# Patient Record
Sex: Male | Born: 1935
Health system: Southern US, Community
[De-identification: ages and names within clinical notes are randomized; demographics above are authoritative.]

## PROBLEM LIST (undated history)

## (undated) DIAGNOSIS — I10 Essential (primary) hypertension: Secondary | ICD-10-CM

## (undated) DIAGNOSIS — G629 Polyneuropathy, unspecified: Secondary | ICD-10-CM

## (undated) DIAGNOSIS — N189 Chronic kidney disease, unspecified: Secondary | ICD-10-CM

## (undated) DIAGNOSIS — G4733 Obstructive sleep apnea (adult) (pediatric): Secondary | ICD-10-CM

## (undated) DIAGNOSIS — I2510 Atherosclerotic heart disease of native coronary artery without angina pectoris: Secondary | ICD-10-CM

## (undated) DIAGNOSIS — R413 Other amnesia: Secondary | ICD-10-CM

## (undated) DIAGNOSIS — Z8673 Personal history of transient ischemic attack (TIA), and cerebral infarction without residual deficits: Secondary | ICD-10-CM

## (undated) DIAGNOSIS — R269 Unspecified abnormalities of gait and mobility: Secondary | ICD-10-CM

## (undated) DIAGNOSIS — N529 Male erectile dysfunction, unspecified: Secondary | ICD-10-CM

## (undated) DIAGNOSIS — E559 Vitamin D deficiency, unspecified: Secondary | ICD-10-CM

## (undated) DIAGNOSIS — H3530 Unspecified macular degeneration: Secondary | ICD-10-CM

## (undated) DIAGNOSIS — E785 Hyperlipidemia, unspecified: Secondary | ICD-10-CM

## (undated) DIAGNOSIS — G4700 Insomnia, unspecified: Secondary | ICD-10-CM

## (undated) DIAGNOSIS — I214 Non-ST elevation (NSTEMI) myocardial infarction: Secondary | ICD-10-CM

## (undated) DIAGNOSIS — Z9289 Personal history of other medical treatment: Secondary | ICD-10-CM

## (undated) DIAGNOSIS — H9190 Unspecified hearing loss, unspecified ear: Secondary | ICD-10-CM

## (undated) HISTORY — PX: ANKLE SURGERY: SHX546

## (undated) HISTORY — PX: KNEE SURGERY: SHX244

## (undated) HISTORY — DX: Unspecified hearing loss, unspecified ear: H91.90

## (undated) HISTORY — PX: CARDIAC CATHETERIZATION: SHX172

## (undated) HISTORY — DX: Essential (primary) hypertension: I10

## (undated) HISTORY — DX: Other amnesia: R41.3

## (undated) HISTORY — DX: Male erectile dysfunction, unspecified: N52.9

## (undated) HISTORY — DX: Vitamin D deficiency, unspecified: E55.9

## (undated) HISTORY — DX: Chronic kidney disease, unspecified: N18.9

## (undated) HISTORY — DX: Unspecified abnormalities of gait and mobility: R26.9

## (undated) HISTORY — DX: Insomnia, unspecified: G47.00

## (undated) HISTORY — DX: Personal history of transient ischemic attack (TIA), and cerebral infarction without residual deficits: Z86.73

## (undated) HISTORY — PX: CORONARY ANGIOPLASTY: SHX604

## (undated) HISTORY — PX: CATARACT EXTRACTION: SUR2

---

## 1974-06-04 HISTORY — PX: BACK SURGERY: SHX140

## 1979-06-05 HISTORY — PX: HERNIA REPAIR: SHX51

## 2002-07-04 ENCOUNTER — Encounter: Payer: Self-pay | Admitting: Emergency Medicine

## 2002-07-04 ENCOUNTER — Encounter: Payer: Self-pay | Admitting: Orthopedic Surgery

## 2002-07-04 ENCOUNTER — Inpatient Hospital Stay (HOSPITAL_COMMUNITY): Admission: EM | Admit: 2002-07-04 | Discharge: 2002-07-05 | Payer: Self-pay | Admitting: Emergency Medicine

## 2003-06-05 DIAGNOSIS — I2510 Atherosclerotic heart disease of native coronary artery without angina pectoris: Secondary | ICD-10-CM

## 2003-06-05 HISTORY — DX: Atherosclerotic heart disease of native coronary artery without angina pectoris: I25.10

## 2004-03-24 ENCOUNTER — Inpatient Hospital Stay (HOSPITAL_COMMUNITY): Admission: AD | Admit: 2004-03-24 | Discharge: 2004-03-28 | Payer: Self-pay | Admitting: Cardiology

## 2008-03-26 ENCOUNTER — Ambulatory Visit: Payer: Self-pay | Admitting: Vascular Surgery

## 2008-03-26 ENCOUNTER — Encounter (INDEPENDENT_AMBULATORY_CARE_PROVIDER_SITE_OTHER): Payer: Self-pay | Admitting: Orthopedic Surgery

## 2008-03-26 ENCOUNTER — Ambulatory Visit: Admission: RE | Admit: 2008-03-26 | Discharge: 2008-03-26 | Payer: Self-pay | Admitting: Orthopedic Surgery

## 2009-10-26 ENCOUNTER — Ambulatory Visit (HOSPITAL_BASED_OUTPATIENT_CLINIC_OR_DEPARTMENT_OTHER): Admission: RE | Admit: 2009-10-26 | Discharge: 2009-10-26 | Payer: Self-pay | Admitting: Orthopedic Surgery

## 2010-05-12 HISTORY — PX: TRANSTHORACIC ECHOCARDIOGRAM: SHX275

## 2010-10-20 NOTE — H&P (Signed)
NAME:  Corey, Larson NO.:  [id number]   MEDICAL RECORD NO.:  [id number]          PATIENT TYPE:  INP   LOCATION:  2002                         FACILITY:  [facility]   PHYSICIAN:  Danae Chen, M.D.DATE OF BIRTH:  1928/05/25   DATE OF ADMISSION:  03/24/2004  DATE OF DISCHARGE:                                HISTORY & PHYSICAL   PRIMARY CARE PHYSICIAN:  Aggie Cosier, M.D.   NURSE PRACTITIONER:  Arletha Pili. Kendall, N.P.   CHIEF COMPLAINT:  Chest discomfort.   HISTORY OF PRESENT ILLNESS:  The patient is a 77 year old very pleasant  retired [employer], a patient of Dr. Guadlupe Spanish, who was seen by the  nurse practitioner today after he had been experiencing chest discomfort  symptoms over the past ten days. He first thought this was related to his  reflux disease for which he takes Tums. He only partial relief of these  symptoms. He noticed that these symptoms had been increasing in frequency as  well as severity and also had an episode where it was brought on by exertion  while working in the yard. Yesterday, this discomfort woke him from sleep.  He describes it as a sometimes burning, but some times also a pressure-like  discomfort that did radiate somewhat to both arms and to his neck. It was  relieved with rest. He has had a prior nuclear medicine study in 1990 and in  1996, both of which he said were normal. He has not had a recent cardiac  evaluation other than that. He denies any syncopal episodes. No current  chest pains.  Some mild nausea, no vomiting. No diaphoresis. No headache.   PAST MEDICAL HISTORY:  1.  Peripheral neuropathy.  2.  Seasonal allergies.  3.  Reflux disease.   PAST SURGICAL HISTORY:  1.  Recent right ankle surgery with pin placement for fracture.  2.  Remote history of hernia repair.  3.  Laminectomy.  4.  Vasectomy.   ALLERGIES:  No known drug allergies.   CURRENT MEDICATIONS:  1.  Toprol XL 50 mg one p.o. daily.  2.   Nitroglycerin 0.4 mg one p.o. every three to five minutes p.r.n. chest      pain.  3.  Imdur 30 mg one p.o. daily.  4.  Aspirin 81 mg p.o. daily.  5.  Neurontin 900 mg p.o. t.i.d.  6.  Xanax 0.5 mg p.o. daily p.r.n.  7.  Viagra 100 mg p.o. p.r.n.   FAMILY HISTORY:  Father died of a motor vehicle accident at age 63. Mother  died at 72. No significant of coronary artery disease that he is aware of.   SOCIAL HISTORY:  He is a retired Statistician. He is widowed and  retired. He does not smoke. He has an occasional glass of wine and some  caffeine. No drug abuse. He lives to play golf.   REVIEW OF SYMPTOMS:  Positive for some mild nausea, some mild diaphoresis  and some chest discomfort. Has some sleep disturbance, but no headache, no  shortness of breath, no cough, no dysphagia, no dysuria, no  rash.   PHYSICAL EXAMINATION:  VITAL SIGNS: He is afebrile, blood pressure 108/62,  weight 180 pounds, respirations 16, pulse 92.  GENERAL: He is alert, oriented, and cooperative.  NECK: Supple. There is no jugular venous distention.  HEENT Oropharynx is clear. Pupils equal and reactive.  LUNGS: Clear to auscultation bilaterally.  HEART: Rate regular with murmurs, rubs, or gallops.  ABDOMEN: Soft, nontender with no rebound or guarding.  EXTREMITIES: He has 2+ femoral pulses bilaterally, 2+ dorsalis pedis pulses  bilaterally. He has no peripheral edema. No clubbing, cyanosis, or edema. He  moves all four extremities.  NEUROLOGIC: Cranial nerves II-XII are also grossly intact.   LABORATORY DATA:  Pending at this time; CBC, CMET, serial troponins.  A 12-  lead EKG done at the outside physician's office showed a normal sinus rhythm  with a slight left axis deviation, possible left anterior fascicular block,  no significant ST elevation or segment depressions noted.   IMPRESSION:  A 77 year old with risk factors for coronary artery disease  that include age, but does not include diabetes,  hypertension, high  cholesterol, family history, tobacco, physical activity, or obesity.  However, given his symptoms that are consistent with unstable angina which  include an increasing frequency and severity of his symptoms it is advisable  that we will proceed with a diagnostic catheterization scheduled for Monday  per Dr. Aleen Campi. At this time he will admitted to telemetry, started on a  beta blocker, nitrates, oxygen, and statin. Will also use low-dose ACE  inhibitor and will monitor serial cardiac enzymes as well.       RLK/MEDQ  D:  03/24/2004  T:  03/24/2004  Job:  [id number]   cc:   Arletha Pili. Wetzel Bjornstad   Antionette Char, MD  [address]  [place]  [place] [address]  Fax: [phone]

## 2010-10-20 NOTE — H&P (Signed)
   NAME:  Corey Larson, CUMBIE NO.:  [id number]   MEDICAL RECORD NO.:  [id number]                   PATIENT TYPE:  EMS   LOCATION:  ED                                   FACILITY:  [facility]   PHYSICIAN:  Sherri Rad, M.D.               DATE OF BIRTH:  1928-03-19   DATE OF ADMISSION:  07/04/2002  DATE OF DISCHARGE:                                HISTORY & PHYSICAL   HISTORY OF PRESENT ILLNESS:  This is a 77 year old male who fell outside his  back door on ice and had an obvious right ankle deformity.  Says he does not  have any pain.  He constantly has burning and tingling in both feet and has  a medical history of neuropathy, past surgical history of double hernia in  1981, a laminectomy in 1970.   MEDICATIONS:  1. Neurontin 360 mg q.i.d.  2. Elavil 10 mg p.r.n. basis.  3. Celebrex 200 mg daily.  4. Allegra p.r.n.   SOCIAL HISTORY:  No tobacco use.  No smoking.  Occasional glass of wine.   ALLERGIES:  No known drug allergies.   PHYSICAL EXAMINATION:  VITAL SIGNS:  Temperature 97, blood pressure 172/89,  heart rate 96, respirations 18.  CARDIOVASCULAR:  Regular rate and rhythm.  No murmurs.  LUNGS:  Clear to auscultation bilaterally.  Negative wheezes.  Negative  rales.  HEENT:  Pupils are equal, round, reactive to light.  NECK:  Supple.  No adenopathy.  ABDOMEN:  Positive bowel sounds, soft, nontender to palpation.  EXTREMITIES:  Right ankle has an obvious bony deformity.  Has palpable DP  and PT pulses equal bilaterally.  He has active range of motion and wiggles  his toes and very minimal range of the right ankle.  Capillary refill is  brisk.  Bilateral calves are soft and nontender to palpation.   REVIEW OF SYSTEMS:  No seizures.  No diarrhea.  No dizziness.  No melena.  No bowel and bladder dysfunction.  No history of diabetes.   IMPRESSION:  Right ankle dislocation with fracture of fibula.   PLAN:  Take him to the OR today on July 04, 2002 for open reduction  internal fixation with Syndesmotic screw and we will admit him per Leonides Grills, M.D.     Lianne Cure, P.A.                     Sherri Rad, M.D.    MC/MEDQ  D:  07/04/2002  T:  07/04/2002  Job:  [id number]

## 2010-10-20 NOTE — Consult Note (Signed)
   NAME:  Corey Larson, BALABAN NO.:  [id number]   MEDICAL RECORD NO.:  [id number]                   PATIENT TYPE:  EMS   LOCATION:  ED                                   [facility]ACILITY:  [facility]   PHYSICIAN:  Leonides Grills, M.D.                  DATE OF BIRTH:  02-08-1928   DATE OF CONSULTATION:  07/04/2002  DATE OF DISCHARGE:                                   CONSULTATION   CHIEF COMPLAINT:  Right ankle deformity.   HISTORY:  This is a 77 year old pleasant gentleman with significant medical  history of idiopathic peripheral neuropathy who slipped and fell on the ice  today and had a right ankle deformity.  He was then brought to the [facility] ER where x-rays were obtained, and I was consulted for further  evaluation and treatment.   PAST MEDICAL HISTORY:  Significant for idiopathic peripheral neuropathy.   MEDICATIONS:  He takes Neurontin.   ALLERGIES:  HE HAS NO KNOWN DRUG ALLERGIES.   SOCIAL HISTORY:  He does not smoke.   PHYSICAL EXAMINATION:  GENERAL:  Well-nourished, well-developed, no apparent  distress.  Very pleasant gentleman.  VITAL SIGNS:  Respirations 18.  EXTREMITIES:  He has palpable dorsalis pedis and posterior tibial pulses  compartments __________ leg and foot.  He has a slight valgus deformity to  the right ankle.  He has decreased sensation in stocking and glove manner  bilateral feet.  He has no skin swelling at this point or significant  ecchymosis.   X-RAYS:  The three views of the right ankle show an SER-IV equivalent  lateral malleolus fracture.   IMPRESSION:  SER-IV equivalent lateral malleolus fracture.   PLAN:  I explained to the Mr. and Mrs. Eddinger that due to the fact that he  has peripheral neuropathy, he will need for more heavy-duty fixation and  will be nonweightbearing for a longer period of time.  I explained to them  the risks of the procedure which include infection, nerve or vessel injury,  nonunion, malunion,  artery revision or artery failure, shock or arthropathy  with possible below knee amputation were all explained.  Questions were  encouraged and answered.  We will proceed with this immediately while his  skin is still without any compromise and without any swelling.  We do this  under spinal.  We will try to do this without a tourniquet to avoid a double  crush phenomenon to the nerves.                                               Leonides Grills, M.D.    PB/MEDQ  D:  07/04/2002  T:  07/04/2002  Job:  [id number]

## 2010-10-20 NOTE — Discharge Summary (Signed)
NAME:  Corey Larson, SCHARA NO.:  [id number]   MEDICAL RECORD NO.:  [id number]          PATIENT TYPE:  INP   LOCATION:  6522                         FACILITY:  [facility]   PHYSICIAN:  Jaclyn Prime. Lucas Mallow, M.D.   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  He described the pain as  substernal, pressing, and radiating to both arms, relieved by rest, and  lasting for 5-10 minutes.  He had arranged to be seen in our office on his  return from [facility], and the night before that office visit, had  prolonged chest pain at rest associated with diaphoresis.  Accordingly, he  was promptly admitted for stabilization and early cardiac catheterization.  His EKGs taken both at [facility] and in our office were unremarkable  except for the absence of an R wave in V1, of questionable significance.   Past medical history, family and social history, review of systems are to be  found in the dictated history and physical.   PHYSICAL EXAM:  GENERAL:  On physical exam, he was awake and alert.  CHEST:  His chest was clear to auscultation and percussion.  CARDIAC:  He has normal peripheral pulses.  His heart sounds were normal.   COURSE IN THE HOSPITAL:  He was started on Lovenox by injection, beta  blockers, and statin drugs.  The second night in the hospital, while  awaiting cardiac catheterization early Monday morning, he again developed  chest pain, was started on nitroglycerin drip, and had no further chest  pain.  Cardiac catheterization was done on March 27, 2004 by Dr. Antionette Char.  It revealed single-vessel coronary artery disease with 95% focal  lesion in the mid left  anterior descending coronary.  There were minor  irregularities of the proximal right coronary and normal left ventricular  function.  Dr. Aleen Campi carried out successful angioplasty with PTCA and  stenting with a drug-eluting stent in the left anterior descending lesion.  This was carried out successfully and Perclose device was used without  difficulty.   Not mentioned above, laboratory data include white cells 9900, hemoglobin  14.5, hematocrit 43, platelets 173,000, normal automated differential, INR  1.1, creatinine 1.2, BUN 15, electrolytes normal, non-fasting glucose 108,  fasting glucose 99, the remainder of the so-called comprehensive metabolic  panel all normal, CK 147, CK-MB 4.6, relative index of 3.1%, troponin I  0.06.  Additional enzyme results were all negative.  Cholesterol was 127  with an LDL of 68 and an HDL of 35.   Chest x-ray was normal.   EKG mentioned above showed sinus rhythm, nonspecific T wave flattening in  the lateral leads, and no diagnostic Q waves.   His course after cardiac catheterization was benign, he walked well and had  no difficulty.  He was ready for discharge the following day.   FINAL DIAGNOSIS:  Unstable angina with single-vessel left anterior  descending coronary disease.   OPERATION:  Cardiac catheterization and drug-eluting stent implantation.   COMPLICATIONS:  None.   RECOMMENDATION ON DISCHARGE:  1.  A cholesterol-lowering diet (although his cholesterol was already very      low).  2.  Medications:      1.  Plavix 15 mg daily.      2.  Toprol-XL 50 mg daily.      3.  Crestor 10 mg 1/2 tablet daily.      4.  Aspirin 81 mg daily (the last pending review).  3.  Pain management not applicable.  4.  Activity:  No heavy lifting or straining for 4 days.  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  5.  Diet:  Cholesterol-lowering.  6.  Wound care:  Not  applicable.  He may shower.   FOLLOWUP:  Followup appointment with physician and further lab work after  his trip.   CONDITION ON DISCHARGE:  Stable and improved.   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       DDG/MEDQ  D:  03/28/2004  T:  03/28/2004  Job:  [id number]   cc:   Antionette Char, MD  [address]  [place]  [place] [address]  Fax: [phone]   Jonita Albee, M.D.  [facility]  [address]  [place]  [place] [address]  Fax: [phone]

## 2010-10-20 NOTE — Cardiovascular Report (Signed)
NAME:  Corey Larson, Corey Larson NO.:  [id number]   MEDICAL RECORD NO.:  [id number]          PATIENT TYPE:  INP   LOCATION:  2002                         FACILITY:  [facility]   PHYSICIAN:  Antionette Char, MD    DATE OF BIRTH:  1927-08-12   DATE OF PROCEDURE:  03/27/2004  DATE OF DISCHARGE:                              CARDIAC CATHETERIZATION   PROCEDURES:  1.  Left heart catheterization.  2.  Coronary cine angiography.  3.  Left internal mammary artery cine angiography.  4.  Left ventricular cine angiography.  5.  Angioplasty with percutaneous transluminal coronary angioplasty and      stenting with drug-eluting stent in the mid left anterior descending      coronary artery.  6.  AngioSeal of the right femoral artery.   REFERRING PHYSICIAN:  Jaclyn Prime. Lucas Mallow, M.D.   CARDIOLOGIST PERFORMING PROCEDURE:  Antionette Char, M.D.   INDICATIONS FOR PROCEDURE:  This 77 year old male had the recent onset of  exertional angina which increased in severity until he was admitted on  March 24, 2004 with the diagnosis of unstable angina.  He was scheduled  for a catheterization today.   DESCRIPTION OF PROCEDURE:  After signing an informed consent, the patient  was premedication with 5 mg of Valium by mouth and brought to the cardiac  catheterization lab.  His right groin was prepped and draped in a sterile  fashion and anesthetized locally with 0.1% lidocaine.  A #6 French  introducer sheath was inserted percutaneously into the right femoral artery.  A 6 French #4 Judkins coronary catheters were used to make injections into  the native coronary arteries.  The right coronary catheter was used to make  a midstream injection into the left subclavian artery to visualize the left  internal mammary artery.  A 6 French pigtail catheter was used to measure  pressures in the left ventricle and aorta and to make mid stream injections  into the left ventricle and abdominal aorta.  After noting a  critical  stenosis in the mid left anterior descending we discussed this with the  patient and reviewed his indications for angioplasty and intervention.  We  also reviewed the cines and findings with Dr. Sharyn Lull.  We then selected a 6  [place] guide catheter which was advanced to the root of the aorta after  engaging the ostium of the left coronary artery to visualize the lesion in  the mid LAD.  A short HTF guidewire was inserted through the guide catheter  and into the LAD and across the lesion positioning the tip in the distal  segment.  We then selected a 3.0 x 15 mm Maverick balloon catheter which was  advanced over the guidewire and positioned within the lesion.  One inflation  was made at 6 atmospheres for 29 seconds.  After this inflation, the balloon  was removed and injections again into the left coronary artery showed a  partial opening with compromised flow into the side branch/diagonal  branches.  We then selected a 3.5 x 20 mm Taxus drug-eluting stent and after  proper preparation this was inserted over the guidewire and positioned  within the lesion.  The stent was then deployed with one inflation at 14  atmospheres for 23 seconds.  The deployment balloon was removed, and further  injections into the LAD showed essentially total occlusion of the two  diagonal side branches that were within the stent.  We then redirected the  guidewire into the distal larger diagonal branch and with instrumentation of  this side branch it returned to normal appearance and normal flow.  Two  intracoronary nitroglycerin injections were made at 200 units and following  these injections the smaller of the two diagonal branches was then patent  again and showed normal flow.  His electrocardiographic changes returned to  normal, and his chest pain was relieved.  He also received fentanyl 50 mg IV  for pain.  Further injections were made into the left coronary artery  showing an excellent  angiographic result within the stent and normal  appearance of the distal diagonal branch and mild stenosis in the proximal  branch which was present prior to the stent deployment.  The patient  tolerated the procedure well, and no complications were noted.  At the end  of the procedure, the catheter and sheath were removed from the right  femoral artery and hemostasis was easily obtained with an AngioSeal closure  system.   Medications given were heparin 3500 units IV, Integrilin drip per pharmacy  protocol, fentanyl 50 mg IV, nitroglycerin 200 units intracoronary x2.   CINE FINDINGS:  Coronary cine angiography:  The left coronary artery, the ostium and left main appear normal.   Left anterior descending:  The proximal LAD appears normal.  There is an  irregular 95% focal lesion in the middle segment with two diagonal side  branches involved within the lesion.  The first is a smaller branch with a  mild to moderate lesion of 50% at its takeoff.  The second was a larger  branch which appeared normal.  The distal LAD appeared normal and was a  large vessel which wrapped around the apex.   The circumflex coronary artery appeared normal.   Right coronary artery:  The right coronary artery had a large Shepherd's  hook.  The ostium appeared normal. There was a mild plaque in the proximal  segment.  There was normal antegrade flow.  The left internal mammary artery  appeared normal.   Left ventricular cine angiogram:  The left ventricular chamber size and  contractility appear normal.  The mitral and aortic valves appeared normal.  The overall left ventricular contractility was normal with an ejection  fraction of approximately 70%.  There was no abnormally moving segments.   Abdominal aortogram:  The mid abdominal aorta and renal arteries appear  normal.   Angioplasty cines: Cines taking during the angioplasty procedure showed proper positioning of the guidewire and balloon catheter  with a good balloon  form obtained.  Further cine showed proper positioning of the stent  deployment system and again with a good balloon deployment obtained.   Other cines showed total occlusion of the two side branch diagonal vessels  which arose within the lesion and stented area.  Further cine showed  successful instrumentation with a guidewire into the more distal larger  diagonal branch with successful reopening and normal flow and normal  appearance without use of the balloon.  Further cine showed reopening of the  proximal smaller branch with return to the preangioplasty appearance of a 40-  50% ostial lesion and normal antegrade flow.  The stented area appeared  normal with 0% residual lesion and no evidence for dissection or clot.  There was normal TIME-3 antegrade flow.   FINAL DIAGNOSES:  1.  Single vessel coronary artery disease with 95% focal lesion in the mid      left anterior descending.  2.  Minor irregularities in the proximal right coronary artery.  3.  Normal left internal mammary artery.  4.  Normal left ventricular function.  5.  Normal abdominal aorta and renal arteries.  6.  Successful angioplasty with percutaneous transluminal coronary      angioplasty and stenting with drug-eluting stent in the mid left      anterior descending lesion.  7.  Successful AngioSeal of the right femoral artery.   DISPOSITION:  We will monitor on the EAD overnight and continue the  Integrilin drip for 18 hours.  Plavix 300 mg was given p.o. and will  continue 75 mg every day.  Hopefully, he will be stable for discharge in the  a.m.      John   JRT/MEDQ  D:  03/27/2004  T:  03/27/2004  Job:  [id number]   cc:   Jaclyn Prime. Lucas Mallow, M.D.  [address]  [place]  [place] [address]  Fax: [phone]   Catheterization Lab

## 2010-10-20 NOTE — Op Note (Signed)
NAME:  Corey Larson, Corey Larson NO.:  [id number]   MEDICAL RECORD NO.:  [id number]                   PATIENT TYPE:  INP   LOCATION:  0465                                 FACILITY:  [facility]   PHYSICIAN:  Leonides Grills, M.D.                  DATE OF BIRTH:  [birth year]   DATE OF PROCEDURE:  07/04/2002  DATE OF DISCHARGE:                                 OPERATIVE REPORT   PREOPERATIVE DIAGNOSES:  1. Right supination, external rotation-type IV, lateral malleolus fracture.  2. Right distal tibia-fibula syndesmotic rupture.   POSTOPERATIVE DIAGNOSES:  1. Right supination, external rotation-type IV, lateral malleolus fracture.  2. Right distal tibia-fibula syndesmotic rupture.   OPERATION:  1. Open reduction internal fixation, right lateral malleolus fracture.  2. Open reduction internal fixation, right distal tibia-fibula syndesmotic     rupture.   ANESTHESIA:  Spinal with sedation.   SURGEON:  Leonides Grills, M.D.   ASSISTANT:  Lianne Cure, P.A.   ESTIMATED BLOOD LOSS:  Minimal.   TOURNIQUET:  None.   COMPLICATIONS:  None.   DISPOSITION:  Stable to PR.   INDICATIONS FOR PROCEDURE:  This is a 77 year old male who slipped and fell  on the ice just hours earlier and sustained the above injuries.  He also had  idiopathic peripheral neuropathy but no other significant medical problems.  He was consented for the above procedure.  All risks which include  infection, neurovascular injury, malunion, nonunion, hardware rotation,  hardware failure, shock, or arthropathy, stiffness, arthritis, and possible  amputation due to severe arthropathy or infection were all explained and  questions were encouraged and answered.   PROCEDURE:  The patient was brought to the operating room, placed in a  supine position after adequate spinal anesthesia with sedation was  administered as well as Ancef 1 gram IV piggyback.  A bump was placed under  right lateral hip and the  right lower extremity was then prepped and draped  in a sterile manner over a proximal placed thigh tourniquet which was not  used.  We started the procedure with a longitudinal incision over the  lateral malleolus.  Dissection was carried down to bone.  The fracture site  was identified.  Hematoma was removed with a curette.  An AO one-third  tubular plate was then chosen and placed on in a semi-antiglide  configuration for anticipation of multiple syndesmotic screws.  Once this  was done, we used a 2-point Weber clamp and reduced the fracture with  internal rotation at the ankle.  Once this was done and the fracture was  anatomically reduced, we applied an antiglide screw just above the fracture  site with a 3-2 drill using a 4.5-mm fully threaded cortical screw.  This is  a positional-type screw.  The ankle was held in dorsiflexion.  Once this was  done, we placed three other proximal syndesmotic screws through this  one.  Again, these were all set screws and were not lagged over.  We then placed  two distal 3.5-mm fully threaded cortical screws in the distal fragment.  C-  arm views were obtained in AP and lateral.  Mortise views showed an anatomic  reduction with reduced ankle.  The wound is copiously irrigated with normal  saline.  The subcutaneous was closed with 3-0 Vicryl.  The skin was closed  with 4-0 nylon.  Sterile dressing applied.  Modified Jones dressing applied.  The ankle dorsiflexed.  The patient was stable to the PR.                                               Leonides Grills, M.D.    PB/MEDQ  D:  07/04/2002  T:  07/04/2002  Job:  [id number]

## 2011-06-20 DIAGNOSIS — G894 Chronic pain syndrome: Secondary | ICD-10-CM | POA: Diagnosis not present

## 2011-06-20 DIAGNOSIS — M5137 Other intervertebral disc degeneration, lumbosacral region: Secondary | ICD-10-CM | POA: Diagnosis not present

## 2011-06-21 DIAGNOSIS — M1710 Unilateral primary osteoarthritis, unspecified knee: Secondary | ICD-10-CM | POA: Diagnosis not present

## 2011-06-28 DIAGNOSIS — M1710 Unilateral primary osteoarthritis, unspecified knee: Secondary | ICD-10-CM | POA: Diagnosis not present

## 2011-07-03 DIAGNOSIS — M109 Gout, unspecified: Secondary | ICD-10-CM | POA: Diagnosis not present

## 2011-07-03 DIAGNOSIS — I10 Essential (primary) hypertension: Secondary | ICD-10-CM | POA: Diagnosis not present

## 2011-07-03 DIAGNOSIS — M47817 Spondylosis without myelopathy or radiculopathy, lumbosacral region: Secondary | ICD-10-CM | POA: Diagnosis not present

## 2011-07-03 DIAGNOSIS — Z125 Encounter for screening for malignant neoplasm of prostate: Secondary | ICD-10-CM | POA: Diagnosis not present

## 2011-07-03 DIAGNOSIS — E559 Vitamin D deficiency, unspecified: Secondary | ICD-10-CM | POA: Diagnosis not present

## 2011-07-03 DIAGNOSIS — E785 Hyperlipidemia, unspecified: Secondary | ICD-10-CM | POA: Diagnosis not present

## 2011-07-05 DIAGNOSIS — M1710 Unilateral primary osteoarthritis, unspecified knee: Secondary | ICD-10-CM | POA: Diagnosis not present

## 2011-07-06 DIAGNOSIS — E782 Mixed hyperlipidemia: Secondary | ICD-10-CM | POA: Diagnosis not present

## 2011-07-06 DIAGNOSIS — I2510 Atherosclerotic heart disease of native coronary artery without angina pectoris: Secondary | ICD-10-CM | POA: Diagnosis not present

## 2011-07-10 DIAGNOSIS — I10 Essential (primary) hypertension: Secondary | ICD-10-CM | POA: Diagnosis not present

## 2011-07-10 DIAGNOSIS — G4733 Obstructive sleep apnea (adult) (pediatric): Secondary | ICD-10-CM | POA: Diagnosis not present

## 2011-07-10 DIAGNOSIS — N529 Male erectile dysfunction, unspecified: Secondary | ICD-10-CM | POA: Diagnosis not present

## 2011-07-10 DIAGNOSIS — R5382 Chronic fatigue, unspecified: Secondary | ICD-10-CM | POA: Diagnosis not present

## 2011-07-10 DIAGNOSIS — E785 Hyperlipidemia, unspecified: Secondary | ICD-10-CM | POA: Diagnosis not present

## 2011-07-10 DIAGNOSIS — I2510 Atherosclerotic heart disease of native coronary artery without angina pectoris: Secondary | ICD-10-CM | POA: Diagnosis not present

## 2011-07-10 DIAGNOSIS — G609 Hereditary and idiopathic neuropathy, unspecified: Secondary | ICD-10-CM | POA: Diagnosis not present

## 2011-07-10 DIAGNOSIS — R0989 Other specified symptoms and signs involving the circulatory and respiratory systems: Secondary | ICD-10-CM | POA: Diagnosis not present

## 2011-07-10 DIAGNOSIS — R0609 Other forms of dyspnea: Secondary | ICD-10-CM | POA: Diagnosis not present

## 2011-07-12 DIAGNOSIS — M1710 Unilateral primary osteoarthritis, unspecified knee: Secondary | ICD-10-CM | POA: Diagnosis not present

## 2011-07-19 DIAGNOSIS — M1710 Unilateral primary osteoarthritis, unspecified knee: Secondary | ICD-10-CM | POA: Diagnosis not present

## 2011-07-19 DIAGNOSIS — H353190 Nonexudative age-related macular degeneration, unspecified eye, stage unspecified: Secondary | ICD-10-CM | POA: Diagnosis not present

## 2011-07-19 DIAGNOSIS — H02409 Unspecified ptosis of unspecified eyelid: Secondary | ICD-10-CM | POA: Diagnosis not present

## 2011-07-26 DIAGNOSIS — G4733 Obstructive sleep apnea (adult) (pediatric): Secondary | ICD-10-CM | POA: Diagnosis not present

## 2011-10-30 DIAGNOSIS — H353190 Nonexudative age-related macular degeneration, unspecified eye, stage unspecified: Secondary | ICD-10-CM | POA: Diagnosis not present

## 2011-10-30 DIAGNOSIS — H353290 Exudative age-related macular degeneration, unspecified eye, stage unspecified: Secondary | ICD-10-CM | POA: Diagnosis not present

## 2011-10-30 DIAGNOSIS — H43819 Vitreous degeneration, unspecified eye: Secondary | ICD-10-CM | POA: Diagnosis not present

## 2011-11-01 DIAGNOSIS — I2510 Atherosclerotic heart disease of native coronary artery without angina pectoris: Secondary | ICD-10-CM | POA: Diagnosis not present

## 2011-11-01 DIAGNOSIS — E782 Mixed hyperlipidemia: Secondary | ICD-10-CM | POA: Diagnosis not present

## 2011-11-01 DIAGNOSIS — G4733 Obstructive sleep apnea (adult) (pediatric): Secondary | ICD-10-CM | POA: Diagnosis not present

## 2011-11-27 DIAGNOSIS — H353290 Exudative age-related macular degeneration, unspecified eye, stage unspecified: Secondary | ICD-10-CM | POA: Diagnosis not present

## 2011-12-25 DIAGNOSIS — H353290 Exudative age-related macular degeneration, unspecified eye, stage unspecified: Secondary | ICD-10-CM | POA: Diagnosis not present

## 2012-01-04 DIAGNOSIS — M5137 Other intervertebral disc degeneration, lumbosacral region: Secondary | ICD-10-CM | POA: Diagnosis not present

## 2012-01-04 DIAGNOSIS — M999 Biomechanical lesion, unspecified: Secondary | ICD-10-CM | POA: Diagnosis not present

## 2012-01-04 DIAGNOSIS — M25559 Pain in unspecified hip: Secondary | ICD-10-CM | POA: Diagnosis not present

## 2012-01-09 DIAGNOSIS — M25559 Pain in unspecified hip: Secondary | ICD-10-CM | POA: Diagnosis not present

## 2012-01-09 DIAGNOSIS — M5137 Other intervertebral disc degeneration, lumbosacral region: Secondary | ICD-10-CM | POA: Diagnosis not present

## 2012-01-09 DIAGNOSIS — M999 Biomechanical lesion, unspecified: Secondary | ICD-10-CM | POA: Diagnosis not present

## 2012-01-09 DIAGNOSIS — N529 Male erectile dysfunction, unspecified: Secondary | ICD-10-CM | POA: Diagnosis not present

## 2012-01-09 DIAGNOSIS — I10 Essential (primary) hypertension: Secondary | ICD-10-CM | POA: Diagnosis not present

## 2012-01-09 DIAGNOSIS — E559 Vitamin D deficiency, unspecified: Secondary | ICD-10-CM | POA: Diagnosis not present

## 2012-01-09 DIAGNOSIS — E785 Hyperlipidemia, unspecified: Secondary | ICD-10-CM | POA: Diagnosis not present

## 2012-01-09 DIAGNOSIS — M109 Gout, unspecified: Secondary | ICD-10-CM | POA: Diagnosis not present

## 2012-01-10 DIAGNOSIS — M25559 Pain in unspecified hip: Secondary | ICD-10-CM | POA: Diagnosis not present

## 2012-01-10 DIAGNOSIS — M5137 Other intervertebral disc degeneration, lumbosacral region: Secondary | ICD-10-CM | POA: Diagnosis not present

## 2012-01-10 DIAGNOSIS — M999 Biomechanical lesion, unspecified: Secondary | ICD-10-CM | POA: Diagnosis not present

## 2012-01-11 DIAGNOSIS — M999 Biomechanical lesion, unspecified: Secondary | ICD-10-CM | POA: Diagnosis not present

## 2012-01-11 DIAGNOSIS — M25559 Pain in unspecified hip: Secondary | ICD-10-CM | POA: Diagnosis not present

## 2012-01-11 DIAGNOSIS — M5137 Other intervertebral disc degeneration, lumbosacral region: Secondary | ICD-10-CM | POA: Diagnosis not present

## 2012-01-14 DIAGNOSIS — M999 Biomechanical lesion, unspecified: Secondary | ICD-10-CM | POA: Diagnosis not present

## 2012-01-14 DIAGNOSIS — M25559 Pain in unspecified hip: Secondary | ICD-10-CM | POA: Diagnosis not present

## 2012-01-14 DIAGNOSIS — M5137 Other intervertebral disc degeneration, lumbosacral region: Secondary | ICD-10-CM | POA: Diagnosis not present

## 2012-01-15 DIAGNOSIS — M5137 Other intervertebral disc degeneration, lumbosacral region: Secondary | ICD-10-CM | POA: Diagnosis not present

## 2012-01-15 DIAGNOSIS — M25559 Pain in unspecified hip: Secondary | ICD-10-CM | POA: Diagnosis not present

## 2012-01-15 DIAGNOSIS — M109 Gout, unspecified: Secondary | ICD-10-CM | POA: Diagnosis not present

## 2012-01-15 DIAGNOSIS — I10 Essential (primary) hypertension: Secondary | ICD-10-CM | POA: Diagnosis not present

## 2012-01-15 DIAGNOSIS — I2510 Atherosclerotic heart disease of native coronary artery without angina pectoris: Secondary | ICD-10-CM | POA: Diagnosis not present

## 2012-01-15 DIAGNOSIS — E785 Hyperlipidemia, unspecified: Secondary | ICD-10-CM | POA: Diagnosis not present

## 2012-01-15 DIAGNOSIS — M999 Biomechanical lesion, unspecified: Secondary | ICD-10-CM | POA: Diagnosis not present

## 2012-01-17 DIAGNOSIS — M25559 Pain in unspecified hip: Secondary | ICD-10-CM | POA: Diagnosis not present

## 2012-01-17 DIAGNOSIS — M999 Biomechanical lesion, unspecified: Secondary | ICD-10-CM | POA: Diagnosis not present

## 2012-01-17 DIAGNOSIS — M5137 Other intervertebral disc degeneration, lumbosacral region: Secondary | ICD-10-CM | POA: Diagnosis not present

## 2012-01-21 DIAGNOSIS — M25559 Pain in unspecified hip: Secondary | ICD-10-CM | POA: Diagnosis not present

## 2012-01-21 DIAGNOSIS — M999 Biomechanical lesion, unspecified: Secondary | ICD-10-CM | POA: Diagnosis not present

## 2012-01-21 DIAGNOSIS — M5137 Other intervertebral disc degeneration, lumbosacral region: Secondary | ICD-10-CM | POA: Diagnosis not present

## 2012-01-22 DIAGNOSIS — M25559 Pain in unspecified hip: Secondary | ICD-10-CM | POA: Diagnosis not present

## 2012-01-22 DIAGNOSIS — M999 Biomechanical lesion, unspecified: Secondary | ICD-10-CM | POA: Diagnosis not present

## 2012-01-22 DIAGNOSIS — M5137 Other intervertebral disc degeneration, lumbosacral region: Secondary | ICD-10-CM | POA: Diagnosis not present

## 2012-01-24 DIAGNOSIS — M999 Biomechanical lesion, unspecified: Secondary | ICD-10-CM | POA: Diagnosis not present

## 2012-01-24 DIAGNOSIS — H353290 Exudative age-related macular degeneration, unspecified eye, stage unspecified: Secondary | ICD-10-CM | POA: Diagnosis not present

## 2012-01-24 DIAGNOSIS — M25559 Pain in unspecified hip: Secondary | ICD-10-CM | POA: Diagnosis not present

## 2012-01-24 DIAGNOSIS — M5137 Other intervertebral disc degeneration, lumbosacral region: Secondary | ICD-10-CM | POA: Diagnosis not present

## 2012-01-28 DIAGNOSIS — M25559 Pain in unspecified hip: Secondary | ICD-10-CM | POA: Diagnosis not present

## 2012-01-28 DIAGNOSIS — M5137 Other intervertebral disc degeneration, lumbosacral region: Secondary | ICD-10-CM | POA: Diagnosis not present

## 2012-01-28 DIAGNOSIS — M999 Biomechanical lesion, unspecified: Secondary | ICD-10-CM | POA: Diagnosis not present

## 2012-01-29 DIAGNOSIS — M25559 Pain in unspecified hip: Secondary | ICD-10-CM | POA: Diagnosis not present

## 2012-01-29 DIAGNOSIS — M999 Biomechanical lesion, unspecified: Secondary | ICD-10-CM | POA: Diagnosis not present

## 2012-01-29 DIAGNOSIS — M5137 Other intervertebral disc degeneration, lumbosacral region: Secondary | ICD-10-CM | POA: Diagnosis not present

## 2012-01-31 DIAGNOSIS — M5137 Other intervertebral disc degeneration, lumbosacral region: Secondary | ICD-10-CM | POA: Diagnosis not present

## 2012-01-31 DIAGNOSIS — M999 Biomechanical lesion, unspecified: Secondary | ICD-10-CM | POA: Diagnosis not present

## 2012-01-31 DIAGNOSIS — M25559 Pain in unspecified hip: Secondary | ICD-10-CM | POA: Diagnosis not present

## 2012-02-05 DIAGNOSIS — M25559 Pain in unspecified hip: Secondary | ICD-10-CM | POA: Diagnosis not present

## 2012-02-05 DIAGNOSIS — M5137 Other intervertebral disc degeneration, lumbosacral region: Secondary | ICD-10-CM | POA: Diagnosis not present

## 2012-02-05 DIAGNOSIS — M999 Biomechanical lesion, unspecified: Secondary | ICD-10-CM | POA: Diagnosis not present

## 2012-02-07 DIAGNOSIS — M999 Biomechanical lesion, unspecified: Secondary | ICD-10-CM | POA: Diagnosis not present

## 2012-02-07 DIAGNOSIS — M5137 Other intervertebral disc degeneration, lumbosacral region: Secondary | ICD-10-CM | POA: Diagnosis not present

## 2012-02-07 DIAGNOSIS — M25559 Pain in unspecified hip: Secondary | ICD-10-CM | POA: Diagnosis not present

## 2012-02-29 DIAGNOSIS — Z23 Encounter for immunization: Secondary | ICD-10-CM | POA: Diagnosis not present

## 2012-03-06 DIAGNOSIS — H353290 Exudative age-related macular degeneration, unspecified eye, stage unspecified: Secondary | ICD-10-CM | POA: Diagnosis not present

## 2012-04-17 ENCOUNTER — Other Ambulatory Visit (HOSPITAL_COMMUNITY): Payer: Self-pay

## 2012-04-17 DIAGNOSIS — I2510 Atherosclerotic heart disease of native coronary artery without angina pectoris: Secondary | ICD-10-CM

## 2012-04-17 DIAGNOSIS — I10 Essential (primary) hypertension: Secondary | ICD-10-CM | POA: Diagnosis not present

## 2012-04-17 DIAGNOSIS — N529 Male erectile dysfunction, unspecified: Secondary | ICD-10-CM | POA: Diagnosis not present

## 2012-04-24 DIAGNOSIS — H353290 Exudative age-related macular degeneration, unspecified eye, stage unspecified: Secondary | ICD-10-CM | POA: Diagnosis not present

## 2012-04-24 DIAGNOSIS — H353190 Nonexudative age-related macular degeneration, unspecified eye, stage unspecified: Secondary | ICD-10-CM | POA: Diagnosis not present

## 2012-05-04 DIAGNOSIS — Z9289 Personal history of other medical treatment: Secondary | ICD-10-CM

## 2012-05-04 HISTORY — DX: Personal history of other medical treatment: Z92.89

## 2012-05-09 ENCOUNTER — Encounter (HOSPITAL_COMMUNITY): Payer: Medicare Other

## 2012-05-13 ENCOUNTER — Ambulatory Visit (HOSPITAL_COMMUNITY)
Admission: RE | Admit: 2012-05-13 | Discharge: 2012-05-13 | Disposition: A | Discharge status: Home / Self Care | Payer: Medicare Other | Source: Ambulatory Visit | Attending: Cardiovascular Disease | Admitting: Cardiovascular Disease

## 2012-05-13 DIAGNOSIS — R5381 Other malaise: Secondary | ICD-10-CM | POA: Insufficient documentation

## 2012-05-13 DIAGNOSIS — I10 Essential (primary) hypertension: Secondary | ICD-10-CM | POA: Diagnosis not present

## 2012-05-13 DIAGNOSIS — Z87891 Personal history of nicotine dependence: Secondary | ICD-10-CM | POA: Insufficient documentation

## 2012-05-13 DIAGNOSIS — I2510 Atherosclerotic heart disease of native coronary artery without angina pectoris: Secondary | ICD-10-CM | POA: Insufficient documentation

## 2012-05-13 DIAGNOSIS — E785 Hyperlipidemia, unspecified: Secondary | ICD-10-CM | POA: Insufficient documentation

## 2012-05-13 HISTORY — PX: NM MYOCAR MULTIPLE W/SPECT: HXRAD626

## 2012-05-13 MED ORDER — TECHNETIUM TC 99M SESTAMIBI GENERIC - CARDIOLITE
8.6000 | Freq: Once | INTRAVENOUS | Status: AC | PRN
  Administered 2012-05-13: 9 via INTRAVENOUS

## 2012-05-13 MED ORDER — TECHNETIUM TC 99M SESTAMIBI GENERIC - CARDIOLITE
26.6000 | Freq: Once | INTRAVENOUS | Status: AC | PRN
  Administered 2012-05-13: 27 via INTRAVENOUS

## 2012-05-13 NOTE — Procedures (Addendum)
[facility] [address] [place] [address] [phone]  Cardiology Nuclear Med Study  Corey Larson. is a 78 y.o. male     MRN : [id number]     DOB: July 20, 1927  Procedure Date: 05/13/2012  Nuclear Med Background Indication for Stress Test:  Evaluation for Ischemia, Stent Patency and PTCA Patency History:  PTCA/Stent 03/2004 Cardiac Risk Factors: History of Smoking, Hypertension, Lipids and Overweight  Symptoms:  Fatigue   Nuclear Pre-Procedure Caffeine/Decaff Intake:  1:00am NPO After: 11:00am   IV Site: R Antecubital  IV 0.9% NS with Angio Cath:  22g  Chest Size (in):  44 IV Started by: Koren Shiver, CNMT  Height: 5\' 4"  (1.626 m)  Cup Size: n/a  BMI:  Body mass index is 29.18 kg/(m^2). Weight:  170 lb (77.111 kg)   Tech Comments:  Patient held toprol 24 hours prior to test    Nuclear Med Study 1 or 2 day study: 1 day  Stress Test Type:  Lexiscan  Order Authorizing Provider:  Nicki Guadalajara, MD   Resting Radionuclide: Technetium 78m Sestamibi  Resting Radionuclide Dose: 8.6 mCi   Stress Radionuclide:  Technetium 40m Sestamibi  Stress Radionuclide Dose: 26.6 mCi           Stress Protocol Rest HR: 58 118  134/77 150/110  Exercise Time (min): 6:30 METS: 7.0   Predicted Max HR: 139 bpm % Max HR: 84.89 bpm Rate Pressure Product: 13244   Dose of Adenosine (mg):  n/a Dose of Lexiscan: n/a mg  Dose of Atropine (mg): n/a Dose of Dobutamine: n/a mcg/kg/min (at max HR)  Stress Test Technologist: Esperanza Sheets, CCT Nuclear Technologist: Gonzella Lex, CNMT   Rest Procedure:  Myocardial perfusion imaging was performed at rest 45 minutes following the intravenous administration of Technetium 79m Sestamibi. Stress Procedure:  The patient performed treadmill exercise using a Bruce  Protocol for 6:30 minutes. The patient stopped due to Fatigue and SOB and denied any chest pain.  There were no significant ST-T wave  changes.  Technetium 68m Sestamibi was injected at peak exercise and myocardial perfusion imaging was performed after a brief delay.  Transient Ischemic Dilatation (Normal <1.22):  1.06 Lung/Heart Ratio (Normal <0.45):  0.44 QGS EDV:  55 ml QGS ESV:  19 ml LV Ejection Fraction: 66%  Signed by     Rest ECG: NSR with non-specific ST-T wave changes  Stress ECG: 1 - 1.5 mm ST depression inferiorly  QPS Raw Data Images:  Normal; no motion artifact; normal heart/lung ratio. Stress Images:  Mild upper septal thinning. Rest Images:  Mild diaphragmatic attenuation and upper septal thinning Subtraction (SDS):  No region of statistically significant ischemia  Impression Exercise Capacity:  Good exercise capacity. BP Response:  Hypertensive diastolic blood pressure response. Clinical Symptoms:  No symptoms. ECG Impression:  Mild 1 - 1.5 mm ST depression in baseline abnormal inferior leads Comparison with Prior Nuclear Study: No significant change from previous study  Overall Impression:  Low risk stress nuclear study revealing mild upper septal thinning and diaphragmatic attenuation without statistically significant ischemia.  LV Wall Motion:  NL LV Function, EF 66%; NL Wall Motion   [name] A, MD  05/13/2012 5:25 PM

## 2012-05-20 DIAGNOSIS — E782 Mixed hyperlipidemia: Secondary | ICD-10-CM | POA: Diagnosis not present

## 2012-05-20 DIAGNOSIS — G4733 Obstructive sleep apnea (adult) (pediatric): Secondary | ICD-10-CM | POA: Diagnosis not present

## 2012-05-20 DIAGNOSIS — I2510 Atherosclerotic heart disease of native coronary artery without angina pectoris: Secondary | ICD-10-CM | POA: Diagnosis not present

## 2012-07-15 DIAGNOSIS — M109 Gout, unspecified: Secondary | ICD-10-CM | POA: Diagnosis not present

## 2012-07-15 DIAGNOSIS — E559 Vitamin D deficiency, unspecified: Secondary | ICD-10-CM | POA: Diagnosis not present

## 2012-07-15 DIAGNOSIS — Z0000 Encounter for general adult medical examination without abnormal findings: Secondary | ICD-10-CM | POA: Diagnosis not present

## 2012-07-15 DIAGNOSIS — I10 Essential (primary) hypertension: Secondary | ICD-10-CM | POA: Diagnosis not present

## 2012-07-15 DIAGNOSIS — E785 Hyperlipidemia, unspecified: Secondary | ICD-10-CM | POA: Diagnosis not present

## 2012-07-15 DIAGNOSIS — Z125 Encounter for screening for malignant neoplasm of prostate: Secondary | ICD-10-CM | POA: Diagnosis not present

## 2012-07-15 DIAGNOSIS — Z23 Encounter for immunization: Secondary | ICD-10-CM | POA: Diagnosis not present

## 2012-07-22 DIAGNOSIS — I2510 Atherosclerotic heart disease of native coronary artery without angina pectoris: Secondary | ICD-10-CM | POA: Diagnosis not present

## 2012-07-22 DIAGNOSIS — I10 Essential (primary) hypertension: Secondary | ICD-10-CM | POA: Diagnosis not present

## 2012-07-22 DIAGNOSIS — E785 Hyperlipidemia, unspecified: Secondary | ICD-10-CM | POA: Diagnosis not present

## 2012-07-24 DIAGNOSIS — H52209 Unspecified astigmatism, unspecified eye: Secondary | ICD-10-CM | POA: Diagnosis not present

## 2012-07-24 DIAGNOSIS — Z961 Presence of intraocular lens: Secondary | ICD-10-CM | POA: Diagnosis not present

## 2012-07-24 DIAGNOSIS — H3530 Unspecified macular degeneration: Secondary | ICD-10-CM | POA: Diagnosis not present

## 2012-08-28 DIAGNOSIS — H353190 Nonexudative age-related macular degeneration, unspecified eye, stage unspecified: Secondary | ICD-10-CM | POA: Diagnosis not present

## 2012-08-28 DIAGNOSIS — H353290 Exudative age-related macular degeneration, unspecified eye, stage unspecified: Secondary | ICD-10-CM | POA: Diagnosis not present

## 2012-08-28 DIAGNOSIS — H43819 Vitreous degeneration, unspecified eye: Secondary | ICD-10-CM | POA: Diagnosis not present

## 2012-09-04 DIAGNOSIS — M5137 Other intervertebral disc degeneration, lumbosacral region: Secondary | ICD-10-CM | POA: Diagnosis not present

## 2012-09-04 DIAGNOSIS — G894 Chronic pain syndrome: Secondary | ICD-10-CM | POA: Diagnosis not present

## 2012-09-24 DIAGNOSIS — M47817 Spondylosis without myelopathy or radiculopathy, lumbosacral region: Secondary | ICD-10-CM | POA: Diagnosis not present

## 2012-09-29 ENCOUNTER — Encounter: Payer: Self-pay | Admitting: Cardiovascular Disease

## 2012-10-02 DIAGNOSIS — L570 Actinic keratosis: Secondary | ICD-10-CM | POA: Diagnosis not present

## 2012-10-02 DIAGNOSIS — L819 Disorder of pigmentation, unspecified: Secondary | ICD-10-CM | POA: Diagnosis not present

## 2012-10-02 DIAGNOSIS — D1801 Hemangioma of skin and subcutaneous tissue: Secondary | ICD-10-CM | POA: Diagnosis not present

## 2012-10-02 DIAGNOSIS — D239 Other benign neoplasm of skin, unspecified: Secondary | ICD-10-CM | POA: Diagnosis not present

## 2012-10-02 DIAGNOSIS — Z85828 Personal history of other malignant neoplasm of skin: Secondary | ICD-10-CM | POA: Diagnosis not present

## 2012-10-02 DIAGNOSIS — B353 Tinea pedis: Secondary | ICD-10-CM | POA: Diagnosis not present

## 2012-11-13 ENCOUNTER — Other Ambulatory Visit: Payer: Self-pay | Admitting: *Deleted

## 2012-11-13 MED ORDER — SIMVASTATIN 40 MG PO TABS: 40.0000 mg | ORAL_TABLET | Freq: Every day | ORAL | Status: DC

## 2012-11-19 ENCOUNTER — Other Ambulatory Visit: Payer: Self-pay | Admitting: Pharmacist Clinician (PhC)/ Clinical Pharmacy Specialist

## 2012-11-19 NOTE — Telephone Encounter (Signed)
LMOM for patient - our records show he was switched from metoprolol to bystolic.  Has refill request in for bystolic.  Pt to call back and clarify.

## 2012-11-21 ENCOUNTER — Telehealth: Payer: Self-pay | Admitting: *Deleted

## 2012-11-21 NOTE — Telephone Encounter (Signed)
Refill request received for metoprolol succinate.  Refill denied.  Pt started on bystolic and metoprolol dc'd.  Call to pt and left message to call the office on Monday.  Denial faxed back to pharmacy.

## 2012-11-24 NOTE — Telephone Encounter (Signed)
Returned call.  Pt informed metoprolol succ refill request denied r/t it being dc'd in December and starting Bystolic.  Pt stated his internist suggested Bystolic and he tried it.  Stated he stopped it a while ago.  Pt advised to contact his internist r/t metoprolol since he is managing his medication.  Pt verbalized understanding and agreed w/ plan.

## 2012-11-24 NOTE — Telephone Encounter (Signed)
Returning your call from Friday. °

## 2012-11-25 ENCOUNTER — Other Ambulatory Visit: Payer: Self-pay

## 2012-11-25 NOTE — Telephone Encounter (Signed)
Pharmacy faxed another request for Metoprolol Succinate. It was discontinued on 05/20/2012 and changed to Bystolic 10 mg. This information was faxed back to the pharmacy.

## 2012-11-26 ENCOUNTER — Other Ambulatory Visit: Payer: Self-pay | Admitting: *Deleted

## 2012-12-02 DIAGNOSIS — H353190 Nonexudative age-related macular degeneration, unspecified eye, stage unspecified: Secondary | ICD-10-CM | POA: Diagnosis not present

## 2012-12-02 DIAGNOSIS — H353290 Exudative age-related macular degeneration, unspecified eye, stage unspecified: Secondary | ICD-10-CM | POA: Diagnosis not present

## 2013-01-01 DIAGNOSIS — H353190 Nonexudative age-related macular degeneration, unspecified eye, stage unspecified: Secondary | ICD-10-CM | POA: Diagnosis not present

## 2013-01-01 DIAGNOSIS — H353290 Exudative age-related macular degeneration, unspecified eye, stage unspecified: Secondary | ICD-10-CM | POA: Diagnosis not present

## 2013-01-12 DIAGNOSIS — E559 Vitamin D deficiency, unspecified: Secondary | ICD-10-CM | POA: Diagnosis not present

## 2013-01-12 DIAGNOSIS — M109 Gout, unspecified: Secondary | ICD-10-CM | POA: Diagnosis not present

## 2013-01-12 DIAGNOSIS — I10 Essential (primary) hypertension: Secondary | ICD-10-CM | POA: Diagnosis not present

## 2013-01-14 DIAGNOSIS — H903 Sensorineural hearing loss, bilateral: Secondary | ICD-10-CM | POA: Diagnosis not present

## 2013-01-14 DIAGNOSIS — M1710 Unilateral primary osteoarthritis, unspecified knee: Secondary | ICD-10-CM | POA: Diagnosis not present

## 2013-01-19 DIAGNOSIS — E785 Hyperlipidemia, unspecified: Secondary | ICD-10-CM | POA: Diagnosis not present

## 2013-01-19 DIAGNOSIS — I10 Essential (primary) hypertension: Secondary | ICD-10-CM | POA: Diagnosis not present

## 2013-01-19 DIAGNOSIS — I2510 Atherosclerotic heart disease of native coronary artery without angina pectoris: Secondary | ICD-10-CM | POA: Diagnosis not present

## 2013-02-05 DIAGNOSIS — H353190 Nonexudative age-related macular degeneration, unspecified eye, stage unspecified: Secondary | ICD-10-CM | POA: Diagnosis not present

## 2013-02-05 DIAGNOSIS — H353290 Exudative age-related macular degeneration, unspecified eye, stage unspecified: Secondary | ICD-10-CM | POA: Diagnosis not present

## 2013-03-09 DIAGNOSIS — Z23 Encounter for immunization: Secondary | ICD-10-CM | POA: Diagnosis not present

## 2013-04-09 DIAGNOSIS — H903 Sensorineural hearing loss, bilateral: Secondary | ICD-10-CM | POA: Diagnosis not present

## 2013-04-15 ENCOUNTER — Encounter (HOSPITAL_COMMUNITY): Payer: Self-pay | Admitting: Emergency Medicine

## 2013-04-15 ENCOUNTER — Inpatient Hospital Stay (HOSPITAL_COMMUNITY)
Admission: EM | Admit: 2013-04-15 | Discharge: 2013-04-17 | DRG: 282 | Disposition: A | Discharge status: Home / Self Care | Payer: Medicare Other | Attending: Cardiovascular Disease | Admitting: Cardiovascular Disease

## 2013-04-15 ENCOUNTER — Emergency Department (HOSPITAL_COMMUNITY): Payer: Medicare Other

## 2013-04-15 DIAGNOSIS — Z87891 Personal history of nicotine dependence: Secondary | ICD-10-CM | POA: Diagnosis not present

## 2013-04-15 DIAGNOSIS — G629 Polyneuropathy, unspecified: Secondary | ICD-10-CM | POA: Diagnosis present

## 2013-04-15 DIAGNOSIS — K449 Diaphragmatic hernia without obstruction or gangrene: Secondary | ICD-10-CM | POA: Diagnosis not present

## 2013-04-15 DIAGNOSIS — I2510 Atherosclerotic heart disease of native coronary artery without angina pectoris: Secondary | ICD-10-CM | POA: Diagnosis present

## 2013-04-15 DIAGNOSIS — G4733 Obstructive sleep apnea (adult) (pediatric): Secondary | ICD-10-CM | POA: Diagnosis present

## 2013-04-15 DIAGNOSIS — Z79899 Other long term (current) drug therapy: Secondary | ICD-10-CM | POA: Diagnosis not present

## 2013-04-15 DIAGNOSIS — Z7902 Long term (current) use of antithrombotics/antiplatelets: Secondary | ICD-10-CM | POA: Diagnosis not present

## 2013-04-15 DIAGNOSIS — G609 Hereditary and idiopathic neuropathy, unspecified: Secondary | ICD-10-CM | POA: Diagnosis present

## 2013-04-15 DIAGNOSIS — I214 Non-ST elevation (NSTEMI) myocardial infarction: Secondary | ICD-10-CM | POA: Diagnosis not present

## 2013-04-15 DIAGNOSIS — I498 Other specified cardiac arrhythmias: Secondary | ICD-10-CM | POA: Diagnosis present

## 2013-04-15 DIAGNOSIS — I208 Other forms of angina pectoris: Secondary | ICD-10-CM

## 2013-04-15 DIAGNOSIS — Z9861 Coronary angioplasty status: Secondary | ICD-10-CM | POA: Diagnosis not present

## 2013-04-15 DIAGNOSIS — Z7982 Long term (current) use of aspirin: Secondary | ICD-10-CM | POA: Diagnosis not present

## 2013-04-15 DIAGNOSIS — E785 Hyperlipidemia, unspecified: Secondary | ICD-10-CM | POA: Diagnosis not present

## 2013-04-15 DIAGNOSIS — R0789 Other chest pain: Secondary | ICD-10-CM | POA: Diagnosis not present

## 2013-04-15 DIAGNOSIS — I2089 Other forms of angina pectoris: Secondary | ICD-10-CM | POA: Diagnosis not present

## 2013-04-15 HISTORY — DX: Polyneuropathy, unspecified: G62.9

## 2013-04-15 HISTORY — DX: Hyperlipidemia, unspecified: E78.5

## 2013-04-15 HISTORY — DX: Atherosclerotic heart disease of native coronary artery without angina pectoris: I25.10

## 2013-04-15 HISTORY — DX: Personal history of other medical treatment: Z92.89

## 2013-04-15 HISTORY — DX: Unspecified macular degeneration: H35.30

## 2013-04-15 HISTORY — DX: Obstructive sleep apnea (adult) (pediatric): G47.33

## 2013-04-15 LAB — BASIC METABOLIC PANEL
BUN: 23 mg/dL (ref 6–23)
CO2: 20 mEq/L (ref 19–32)
Calcium: 10.1 mg/dL (ref 8.4–10.5)
Chloride: 104 mEq/L (ref 96–112)
Creatinine, Ser: 1.56 mg/dL — ABNORMAL HIGH (ref 0.50–1.35)
GFR calc Af Amer: 46 mL/min — ABNORMAL LOW (ref >90)
GFR calc non Af Amer: 40 mL/min — ABNORMAL LOW (ref >90)
Glucose, Bld: 100 mg/dL — ABNORMAL HIGH (ref 70–99)
Potassium: 4.3 mEq/L (ref 3.5–5.1)
Sodium: 137 mEq/L (ref 135–145)

## 2013-04-15 LAB — HEMOGLOBIN A1C: Mean Plasma Glucose: 126 mg/dL — ABNORMAL HIGH (ref <117)

## 2013-04-15 LAB — CBC
HCT: 41.2 % (ref 39.0–52.0)
Hemoglobin: 13.6 g/dL (ref 13.0–17.0)
MCH: 27.9 pg (ref 26.0–34.0)
MCHC: 33 g/dL (ref 30.0–36.0)
MCV: 84.4 fL (ref 78.0–100.0)
Platelets: 165 10*3/uL (ref 150–400)
RBC: 4.88 MIL/uL (ref 4.22–5.81)
RDW: 15.6 % — ABNORMAL HIGH (ref 11.5–15.5)
WBC: 9.6 10*3/uL (ref 4.0–10.5)

## 2013-04-15 LAB — APTT: aPTT: 27 seconds (ref 24–37)

## 2013-04-15 LAB — PROTIME-INR: Prothrombin Time: 13.6 seconds (ref 11.6–15.2)

## 2013-04-15 LAB — TROPONIN I: Troponin I: <0.3 ng/mL (ref <0.30)

## 2013-04-15 LAB — POCT I-STAT TROPONIN I: Troponin i, poc: 0 ng/mL (ref 0.00–0.08)

## 2013-04-15 MED ORDER — ALPRAZOLAM 0.25 MG PO TABS: 0.2500 mg | ORAL_TABLET | Freq: Two times a day (BID) | ORAL | Status: DC | PRN

## 2013-04-15 MED ORDER — ASPIRIN EC 81 MG PO TBEC
81.0000 mg | DELAYED_RELEASE_TABLET | Freq: Every day | ORAL | Status: DC
  Administered 2013-04-15 – 2013-04-16 (×2): 81 mg via ORAL
  Filled 2013-04-15 (×3): qty 1

## 2013-04-15 MED ORDER — NEBIVOLOL HCL 5 MG PO TABS
5.0000 mg | ORAL_TABLET | Freq: Every day | ORAL | Status: DC
  Administered 2013-04-15 – 2013-04-16 (×2): 5 mg via ORAL
  Filled 2013-04-15 (×4): qty 1

## 2013-04-15 MED ORDER — ACETAMINOPHEN 325 MG PO TABS: 650.0000 mg | ORAL_TABLET | ORAL | Status: DC | PRN

## 2013-04-15 MED ORDER — NITROGLYCERIN 2 % TD OINT
0.5000 [in_us] | TOPICAL_OINTMENT | Freq: Three times a day (TID) | TRANSDERMAL | Status: DC
  Administered 2013-04-15 – 2013-04-17 (×4): 0.5 [in_us] via TOPICAL
  Filled 2013-04-15: qty 30

## 2013-04-15 MED ORDER — NITROGLYCERIN 0.4 MG SL SUBL: 0.4000 mg | SUBLINGUAL_TABLET | SUBLINGUAL | Status: DC | PRN

## 2013-04-15 MED ORDER — HEPARIN BOLUS VIA INFUSION
3000.0000 [IU] | Freq: Once | INTRAVENOUS | Status: AC
  Administered 2013-04-15: 20:00:00 3000 [IU] via INTRAVENOUS
  Filled 2013-04-15: qty 3000

## 2013-04-15 MED ORDER — SIMVASTATIN 40 MG PO TABS
40.0000 mg | ORAL_TABLET | Freq: Every day | ORAL | Status: DC
  Filled 2013-04-15 (×4): qty 1

## 2013-04-15 MED ORDER — ONDANSETRON HCL 4 MG/2ML IJ SOLN: 4.0000 mg | Freq: Four times a day (QID) | INTRAMUSCULAR | Status: DC | PRN

## 2013-04-15 MED ORDER — NITROGLYCERIN 2 % TD OINT
0.5000 [in_us] | TOPICAL_OINTMENT | Freq: Once | TRANSDERMAL | Status: DC
  Filled 2013-04-15: qty 30
  Filled 2013-04-15: qty 60

## 2013-04-15 MED ORDER — SODIUM CHLORIDE 0.9 % IV SOLN
INTRAVENOUS | Status: DC
  Administered 2013-04-15: 19:00:00 via INTRAVENOUS

## 2013-04-15 MED ORDER — HEPARIN (PORCINE) IN NACL 100-0.45 UNIT/ML-% IJ SOLN
1000.0000 [IU]/h | INTRAMUSCULAR | Status: DC
  Administered 2013-04-15: 20:00:00 1000 [IU]/h via INTRAVENOUS
  Filled 2013-04-15: qty 250

## 2013-04-15 MED ORDER — ZOLPIDEM TARTRATE 5 MG PO TABS: 5.0000 mg | ORAL_TABLET | Freq: Every evening | ORAL | Status: DC | PRN

## 2013-04-15 MED ORDER — GABAPENTIN 400 MG PO CAPS
800.0000 mg | ORAL_CAPSULE | Freq: Three times a day (TID) | ORAL | Status: DC
  Administered 2013-04-15 – 2013-04-17 (×5): 800 mg via ORAL
  Filled 2013-04-15 (×10): qty 2

## 2013-04-15 NOTE — ED Notes (Signed)
Report given to lindsey, rn on floor

## 2013-04-15 NOTE — ED Notes (Signed)
PA at bedside Pt alert and oriented x4. Respirations even and unlabored, bilateral symmetrical rise and fall of chest. Skin warm and dry. In no acute distress. Denies needs.   

## 2013-04-15 NOTE — ED Notes (Signed)
Pt upon assessment denies chest pain.

## 2013-04-15 NOTE — Progress Notes (Signed)
ANTICOAGULATION CONSULT NOTE - Initial Consult  Pharmacy Consult for Heparin Indication:   No Known Allergies  Patient Measurements: Height: 5' 6.5" (168.9 cm) Weight: 172 lb 9.6 oz (78.291 kg) IBW/kg (Calculated) : 64.95 Heparin Dosing Weight: 78 kg  Vital Signs: Temp: 98.5 F (36.9 C) (11/12 1929) Temp src: Oral (11/12 1929) BP: 156/80 mmHg (11/12 1929) Pulse Rate: 64 (11/12 1929)  Labs:  Recent Labs  04/15/13 1500 04/15/13 1846  HGB 13.6  --   HCT 41.2  --   PLT 165  --   APTT  --  27  LABPROT  --  13.6  INR  --  1.06  CREATININE 1.56*  --   TROPONINI  --  <0.30   Estimated Creatinine Clearance: 36.3 ml/min (by C-G formula based on Cr of 1.56).  Medical History: Past Medical History  Diagnosis Date  . CAD (coronary artery disease) 2005    stent to LAD by Dr. Aleen Campi. mild irregulatities of RCA.    . OSA (obstructive sleep apnea)     mild, never on cpap  . Hyperlipidemia LDL goal < 70   . H/O exercise stress test 05/2012    no statistically significant ischemia.   . Peripheral neuropathy   . Macular degeneration     bilateral   Medications:  Scheduled:  . aspirin EC  81 mg Oral QHS  . gabapentin  800 mg Oral TID  . heparin  3,000 Units Intravenous Once  . nebivolol  5 mg Oral QHS  . nitroGLYCERIN  0.5 inch Topical Once  . nitroGLYCERIN  0.5 inch Topical Q8H  . simvastatin  40 mg Oral QHS   Infusions:  . sodium chloride 20 mL/hr at 04/15/13 1854  . heparin     Assessment: 79 y.o. male with a PMH of HTN, HLD, and CAD s/p stent 2005 who presents to the ED for evaluation of chest pain, troponin negative. Begin Heparin infusion per pharmacy, cycle troponins. If enzymes remain negative and no recurrent chest pain plan d/c home in am, then outpt Myoview. On ASA PTA 81 mg daily.  SCr elevated, Cl < 50 ml/min. Coag results normal.  Reduced loading dose with renal dysfunction.  Goal of Therapy:  Heparin level 0.3-0.7 units/ml Monitor platelets by  anticoagulation protocol: Yes   Plan:   Heparin 3000 units, then infusion at 1000 units/hr  Check first Heparin level in 8 hrs, then daily thereafter  Daily CBC while on Heparin  Otho Bellows PharmD Pager [phone] 04/15/2013, 8:12 PM

## 2013-04-15 NOTE — Progress Notes (Signed)
   CARE MANAGEMENT ED NOTE 04/15/2013  Patient:  BRANNAN, CASSEDY   Account Number:  [id number]  Date Initiated:  04/15/2013  Documentation initiated by:  Edd Arbour  Subjective/Objective Assessment:   79 yr old male medicare/[employer] C[employer] pt pcp Dr Thayer Headings and CV Dr Nicki Guadalajara     Subjective/Objective Assessment Detail:     Action/Plan:   EPIC updated for pcp & CV   Action/Plan Detail:   Anticipated DC Date:       Status Recommendation to Physician:   Result of Recommendation:    Other ED Services  Consult Working Plan    DC Planning Services  Other  PCP issues  Outpatient Services - Pt will follow up    Choice offered to / List presented to:            Status of service:  Completed, signed off  ED Comments:   ED Comments Detail:

## 2013-04-15 NOTE — ED Provider Notes (Signed)
CSN: [id number]     Arrival date & time 04/15/13  1441 History   None    Chief Complaint  Patient presents with  . Chest Pain  . Arm Pain    HPI  Corey Larson. is a 79 y.o. male with a PMH of HTN, HLD, and CAD s/p stent 2005 who presents to the ED for evaluation of chest pain.  History was provided by the patient.  Patient states that he developed sudden onset chest pain at 1:45 PM while mowing his lawn.  His chest pain was located diffusely across his chest with radiation down his left arm and up the left side of his neck.  His chest pain improved since onset.  He describes a pressure sensation.  He states he had similar chest pain years ago after golfing, which resulted in the placement of his stent.  Associated symptoms with his chest pain include diaphoresis and SOB which has resolved.  He chewed a 324 mg aspirin at home prior to arrival.  He still has mild chest pain.  He is a previous tobacco user.  No significant FH of cardiac disease.  Patient's cardiologist is Dr. [name] with [facility] [place].  He has a stress test scheduled in December but denies any recent cardiac work-up.  He otherwise has been well.  No fevers, chills, cough, change in appetite/activity, rhinorrhea, congestion, nausea, emesis, lightheadedness, headaches, or leg edema.      No past medical history on file. No past surgical history on file. No family history on file. History  Substance Use Topics  . Smoking status: Not on file  . Smokeless tobacco: Not on file  . Alcohol Use: Not on file    Review of Systems  Constitutional: Positive for diaphoresis. Negative for fever, chills, activity change, appetite change and fatigue.  HENT: Negative for congestion and rhinorrhea.   Respiratory: Positive for shortness of breath. Negative for cough and wheezing.   Cardiovascular: Positive for chest pain. Negative for palpitations and leg swelling.  Gastrointestinal: Negative for nausea, vomiting and  abdominal pain.  Genitourinary: Negative for dysuria.  Musculoskeletal: Negative for back pain and gait problem.  Skin: Negative for color change, pallor and wound.  Neurological: Negative for dizziness, weakness, light-headedness and headaches.    Allergies  Review of patient's allergies indicates no known allergies.  Home Medications   Current Outpatient Rx  Name  Route  Sig  Dispense  Refill  . simvastatin (ZOCOR) 40 MG tablet   Oral   Take 1 tablet (40 mg total) by mouth at bedtime.   30 tablet   6    BP 164/85  Pulse 63  Temp(Src) 98.3 F (36.8 C) (Oral)  Resp 16  SpO2 99%  Filed Vitals:   04/15/13 1800 04/15/13 1848 04/15/13 1929 04/16/13 0454  BP: 137/75 140/75 156/80 143/74  Pulse: 56 61 64 56  Temp:  97.9 F (36.6 C) 98.5 F (36.9 C) 98.4 F (36.9 C)  TempSrc:  Oral Oral Oral  Resp: 10 16 16 20   Height:   5' 6.5" (1.689 m)   Weight:   172 lb 9.6 oz (78.291 kg)   SpO2:  100% 100% 100%    Physical Exam  Nursing note and vitals reviewed. Constitutional: He is oriented to person, place, and time. He appears well-developed and well-nourished. No distress.  HENT:  Head: Normocephalic and atraumatic.  Right Ear: External ear normal.  Left Ear: External ear normal.  Nose: Nose normal.  Mouth/Throat:  Oropharynx is clear and moist.  Eyes: Conjunctivae are normal. Pupils are equal, round, and reactive to light. Right eye exhibits no discharge. Left eye exhibits no discharge.  Neck: Normal range of motion. Neck supple.  Cardiovascular: Normal rate, regular rhythm, normal heart sounds and intact distal pulses.  Exam reveals no gallop and no friction rub.   No murmur heard. Dorsalis pedis pulses present bilaterally  Pulmonary/Chest: Effort normal and breath sounds normal. No respiratory distress. He has no wheezes. He has no rales. He exhibits no tenderness.  Abdominal: Soft. He exhibits no distension. There is no tenderness.  Musculoskeletal: Normal range of  motion. He exhibits no edema and no tenderness.  No leg edema bilaterally   Neurological: He is alert and oriented to person, place, and time.  Skin: Skin is warm and dry. He is not diaphoretic.    ED Course  Procedures (including critical care time) Labs Review Labs Reviewed  CBC  BASIC METABOLIC PANEL   Imaging Review No results found.  EKG Interpretation     Ventricular Rate:    PR Interval:    QRS Duration:   QT Interval:    QTC Calculation:   R Axis:     Text Interpretation:             Results for orders placed during the hospital encounter of 04/15/13  CBC      Result Value Range   WBC 9.6  4.0 - 10.5 K/uL   RBC 4.88  4.22 - 5.81 MIL/uL   Hemoglobin 13.6  13.0 - 17.0 g/dL   HCT 40.9  81.1 - 91.4 %   MCV 84.4  78.0 - 100.0 fL   MCH 27.9  26.0 - 34.0 pg   MCHC 33.0  30.0 - 36.0 g/dL   RDW 78.2 (*) 95.6 - 21.3 %   Platelets 165  150 - 400 K/uL  BASIC METABOLIC PANEL      Result Value Range   Sodium 137  135 - 145 mEq/L   Potassium 4.3  3.5 - 5.1 mEq/L   Chloride 104  96 - 112 mEq/L   CO2 20  19 - 32 mEq/L   Glucose, Bld 100 (*) 70 - 99 mg/dL   BUN 23  6 - 23 mg/dL   Creatinine, Ser 0.86 (*) 0.50 - 1.35 mg/dL   Calcium 57.8  8.4 - 46.9 mg/dL   GFR calc non Af Amer 40 (*) >90 mL/min   GFR calc Af Amer 46 (*) >90 mL/min  TROPONIN I      Result Value Range   Troponin I <0.30  <0.30 ng/mL  TROPONIN I      Result Value Range   Troponin I 1.04 (*) <0.30 ng/mL  TROPONIN I      Result Value Range   Troponin I 1.45 (*) <0.30 ng/mL  PROTIME-INR      Result Value Range   Prothrombin Time 13.6  11.6 - 15.2 seconds   INR 1.06  0.00 - 1.49  APTT      Result Value Range   aPTT 27  24 - 37 seconds  TSH      Result Value Range   TSH 1.012  0.350 - 4.500 uIU/mL  MAGNESIUM      Result Value Range   Magnesium 2.2  1.5 - 2.5 mg/dL  HEMOGLOBIN G2X      Result Value Range   Hemoglobin A1C 6.0 (*) <5.7 %   Mean Plasma Glucose 126 (*) <117  mg/dL  LIPID  PANEL      Result Value Range   Cholesterol 105  0 - 200 mg/dL   Triglycerides 65  <914 mg/dL   HDL 42  >78 mg/dL   Total CHOL/HDL Ratio 2.5     VLDL 13  0 - 40 mg/dL   LDL Cholesterol 50  0 - 99 mg/dL  CBC      Result Value Range   WBC 6.9  4.0 - 10.5 K/uL   RBC 4.28  4.22 - 5.81 MIL/uL   Hemoglobin 11.9 (*) 13.0 - 17.0 g/dL   HCT 29.5 (*) 62.1 - 30.8 %   MCV 84.3  78.0 - 100.0 fL   MCH 27.8  26.0 - 34.0 pg   MCHC 33.0  30.0 - 36.0 g/dL   RDW 65.7 (*) 84.6 - 96.2 %   Platelets 151  150 - 400 K/uL  BASIC METABOLIC PANEL      Result Value Range   Sodium 140  135 - 145 mEq/L   Potassium 3.8  3.5 - 5.1 mEq/L   Chloride 106  96 - 112 mEq/L   CO2 24  19 - 32 mEq/L   Glucose, Bld 93  70 - 99 mg/dL   BUN 22  6 - 23 mg/dL   Creatinine, Ser 9.52  0.50 - 1.35 mg/dL   Calcium 8.9  8.4 - 84.1 mg/dL   GFR calc non Af Amer 52 (*) >90 mL/min   GFR calc Af Amer 60 (*) >90 mL/min  HEPATIC FUNCTION PANEL      Result Value Range   Total Protein 6.3  6.0 - 8.3 g/dL   Albumin 3.3 (*) 3.5 - 5.2 g/dL   AST 25  0 - 37 U/L   ALT 12  0 - 53 U/L   Alkaline Phosphatase 47  39 - 117 U/L   Total Bilirubin 1.3 (*) 0.3 - 1.2 mg/dL   Bilirubin, Direct 0.2  0.0 - 0.3 mg/dL   Indirect Bilirubin 1.1 (*) 0.3 - 0.9 mg/dL  HEPARIN LEVEL (UNFRACTIONATED)      Result Value Range   Heparin Unfractionated 0.86 (*) 0.30 - 0.70 IU/mL  POCT I-STAT TROPONIN I      Result Value Range   Troponin i, poc 0.00  0.00 - 0.08 ng/mL   Comment 3             DG Chest 2 View (Final result)  Result time: 04/15/13 15:58:37    Final result by Rad Results In Interface (04/15/13 15:58:37)    Narrative:   CLINICAL DATA: Chest pain. History of coronary artery disease.  EXAM: CHEST 2 VIEW  COMPARISON: 10/26/2009  FINDINGS: Heart size and pulmonary vascularity are normal and the lungs are clear. No acute osseous abnormality. The patient now has a moderate hiatal hernia which was not apparent on the prior  exam.  IMPRESSION: No acute abnormality. New moderate hiatal hernia.   Electronically Signed By: Geanie Cooley M.D. On: 04/15/2013 15:58      Date: 04/16/2013  Rate: 54  Rhythm: sinus bradycardia  QRS Axis: normal  Intervals: normal  ST/T Wave abnormalities: normal  Conduction Disutrbances:none  Narrative Interpretation:   Old EKG Reviewed: none available   MDM   Corey Larson. is a 79 y.o. male with a PMH of HTN, HLD, and CAD s/p stent 2005 who presents to the ED for evaluation of chest pain.  EKG, chest x-ray, troponin, BMP, and CBC ordered.  Nitro ordered for  pain.     Rechecks  4:30 PM = Pain resolved.  Resting comfortably.  Asymptomatic.  Will hold nitro for now.    Consults  4:40 PM = Spoke with Nicki Guadalajara with cardiology who is sending someone over to evaluate the patient.     Patient admitted for chest pain rule out.  Patient seen by cardiology in the ED.  His chest pain resolved throughout his ED visit.  Troponin negative.  EKG showed no acute ischemic findings.  Chest x-ray negative for an acute cardiopulmonary process, however, did show a moderate hiatal hernia.  Patient has significant risk factors for cardiac disease including CAD s/p stent, HTN, and HLD.  He was in agreement with admission and plan.    Final impressions: 1. Anginal chest pain at rest   2. CAD (coronary artery disease)       Luiz Iron PA-C   This patient was discussed with Dr. Vinetta Bergamo, PA-C 04/16/13 1205

## 2013-04-15 NOTE — H&P (Signed)
Corey Larson. is an 79 y.o. male.    Primary Cardiologist: Dr. [name] PCP Dr. Redgie Grayer   Chief Complaint: chest pain HPI: Corey Larson. is a 79 y.o. male with a PMH of HTN, HLD, and CAD s/p stent 2005 who presents to the ED for evaluation of chest pain. History was provided by the patient. Patient states that he developed sudden onset chest pain at 1:45 PM while mowing his lawn. His chest pain was located diffusely across his chest with radiation down his left arm and up the left side of his neck. His chest pain improved since onset. He describes a pressure sensation. He states he had similar chest pain years ago after golfing and resulted in a stent. Associated symptoms with his chest pain include diaphoresis and SOB which has resolved. He chewed a 324 mg aspirin at home prior to arrival. He still has mild chest pain. Pain lasted approx 2 hours. He is a previous tobacco user. No significant FH of cardiac disease. Patient's cardiologist is Dr. [name] with [facility]. He has a stress test scheduled in December but denies any recent cardiac work-up. Last stress test was 12/13 and stable.  He otherwise has been well. No fevers, chills, cough, change in appetite/activity, rhinorrhea, congestion, nausea, emesis, lightheadedness, headaches, or leg edema  EKG SR no old to compare,  Troponin neg.  Past Medical History  Diagnosis Date  . CAD (coronary artery disease) 2005    stent to LAD by Dr. Aleen Campi. mild irregulatities of RCA.    . OSA (obstructive sleep apnea)     mild, never on cpap  . Hyperlipidemia LDL goal < 70   . H/O exercise stress test 05/2012    no statistically significant ischemia.     Past Surgical History  Procedure Laterality Date  . Cardiac catheterization      2005, stent placed DES LAD  . Knee surgery      right knee 2011 for blockage  . Ankle surgery      right ankle 2002  . Coronary angioplasty      History reviewed. No  pertinent family history. Social History:  reports that he has never smoked. He has never used smokeless tobacco. He reports that he drinks alcohol. He reports that he does not use illicit drugs.  Allergies: No Known Allergies   (Not in a hospital admission)  Results for orders placed during the hospital encounter of 04/15/13 (from the past 48 hour(s))  CBC     Status: Abnormal   Collection Time    04/15/13  3:00 PM      Result Value Range   WBC 9.6  4.0 - 10.5 K/uL   RBC 4.88  4.22 - 5.81 MIL/uL   Hemoglobin 13.6  13.0 - 17.0 g/dL   HCT 16.1  09.6 - 04.5 %   MCV 84.4  78.0 - 100.0 fL   MCH 27.9  26.0 - 34.0 pg   MCHC 33.0  30.0 - 36.0 g/dL   RDW 40.9 (*) 81.1 - 91.4 %   Platelets 165  150 - 400 K/uL  BASIC METABOLIC PANEL     Status: Abnormal   Collection Time    04/15/13  3:00 PM      Result Value Range   Sodium 137  135 - 145 mEq/L   Potassium 4.3  3.5 - 5.1 mEq/L   Chloride 104  96 - 112 mEq/L  CO2 20  19 - 32 mEq/L   Glucose, Bld 100 (*) 70 - 99 mg/dL   BUN 23  6 - 23 mg/dL   Creatinine, Ser 4.40 (*) 0.50 - 1.35 mg/dL   Calcium 34.7  8.4 - 42.5 mg/dL   GFR calc non Af Amer 40 (*) >90 mL/min   GFR calc Af Amer 46 (*) >90 mL/min   Comment: (NOTE)     The eGFR has been calculated using the CKD EPI equation.     This calculation has not been validated in all clinical situations.     eGFR's persistently <90 mL/min signify possible Chronic Kidney     Disease.  POCT I-STAT TROPONIN I     Status: None   Collection Time    04/15/13  3:14 PM      Result Value Range   Troponin i, poc 0.00  0.00 - 0.08 ng/mL   Comment 3            Comment: Due to the release kinetics of cTnI,     a negative result within the first hours     of the onset of symptoms does not rule out     myocardial infarction with certainty.     If myocardial infarction is still suspected,     repeat the test at appropriate intervals.   Dg Chest 2 View  04/15/2013   CLINICAL DATA:  Chest pain.   History of coronary artery disease.  EXAM: CHEST  2 VIEW  COMPARISON:  10/26/2009  FINDINGS: Heart size and pulmonary vascularity are normal and the lungs are clear. No acute osseous abnormality. The patient now has a moderate hiatal hernia which was not apparent on the prior exam.  IMPRESSION: No acute abnormality.  New moderate hiatal hernia.   Electronically Signed   By: Geanie Cooley M.D.   On: 04/15/2013 15:58    ROS: General:no colds or fevers, no weight changes Skin:no rashes or ulcers HEENT:no blurred vision, no congestion CV:see HPI PUL:see HPI GI:no diarrhea constipation or melena, no indigestion GU:no hematuria, no dysuria MS:no joint pain, no claudication Neuro:no syncope, no lightheadedness Endo:no diabetes, no thyroid disease   Blood pressure 138/71, pulse 56, temperature 98.3 F (36.8 C), temperature source Oral, resp. rate 15, SpO2 100.00%. PE: General:Pleasant affect, NAD Skin:Warm and dry, brisk capillary refill HEENT:normocephalic, sclera clear, mucus membranes moist Neck:supple, no JVD, no bruits  Heart:S1S2 RRR without murmur, gallup, rub or click Lungs:clear without rales, rhonchi, or wheezes ZDG:LOVF, non tender, + BS, do not palpate liver spleen or masses Ext:no lower ext edema, 2+ pedal pulses, 2+ radial pulses Neuro:alert and oriented, MAE, follows commands, + facial symmetry    Assessment/Plan Principal Problem:   Anginal chest pain at rest Active Problems:   CAD (coronary artery disease), Hx of LAD DES in 2005  PLAN: continue home meds admit to tele. IV hep. NTG paste continue neurotin for peripheral neuropathy.  [facility] R Nurse Practitioner Certified [facility] Pager [phone] 04/15/2013, 6:01 PM    Agree with note written by Nada Boozer RNP  Known CAD with exertional angina today that was prolonged. Currently pain free on NTP. Exam benign. Labs OK. Enz neg. Will admit, put on IV hep, cycle enz. . If neg and no  recurrent CP plan D/C home AM and OP myoview then ROV with Dr. Perry Mount J 04/15/2013 7:13 PM

## 2013-04-15 NOTE — ED Notes (Signed)
Pt denying chest pain at present. Per PA rn to hold nitroglycerin ointment

## 2013-04-15 NOTE — ED Notes (Addendum)
Pt reports cardiac stent placed in 2005, pt sees Dr [name] every 6 months. Pt denies MI in 2005, pt had a blockage.   Pt reports all over/generalized chest pain after lawn mowing for 40 min, pain radiating to neck and arms. Pain 5/10 Denies SOB at present.

## 2013-04-16 ENCOUNTER — Encounter (HOSPITAL_COMMUNITY)
Admission: EM | Disposition: A | Discharge status: Home / Self Care | Payer: Self-pay | Source: Home / Self Care | Attending: Cardiovascular Disease

## 2013-04-16 DIAGNOSIS — E785 Hyperlipidemia, unspecified: Secondary | ICD-10-CM | POA: Diagnosis not present

## 2013-04-16 DIAGNOSIS — I2510 Atherosclerotic heart disease of native coronary artery without angina pectoris: Secondary | ICD-10-CM

## 2013-04-16 DIAGNOSIS — I214 Non-ST elevation (NSTEMI) myocardial infarction: Secondary | ICD-10-CM | POA: Diagnosis not present

## 2013-04-16 DIAGNOSIS — I498 Other specified cardiac arrhythmias: Secondary | ICD-10-CM | POA: Diagnosis not present

## 2013-04-16 HISTORY — PX: LEFT HEART CATHETERIZATION WITH CORONARY ANGIOGRAM: SHX5451

## 2013-04-16 HISTORY — DX: Non-ST elevation (NSTEMI) myocardial infarction: I21.4

## 2013-04-16 LAB — HEPATIC FUNCTION PANEL
ALT: 12 U/L (ref 0–53)
Alkaline Phosphatase: 47 U/L (ref 39–117)
Bilirubin, Direct: 0.2 mg/dL (ref 0.0–0.3)
Indirect Bilirubin: 1.1 mg/dL — ABNORMAL HIGH (ref 0.3–0.9)
Total Bilirubin: 1.3 mg/dL — ABNORMAL HIGH (ref 0.3–1.2)
Total Protein: 6.3 g/dL (ref 6.0–8.3)

## 2013-04-16 LAB — TROPONIN I
Troponin I: 1.04 ng/mL (ref <0.30)
Troponin I: 1.45 ng/mL (ref <0.30)
Troponin I: 0.76 ng/mL (ref <0.30)

## 2013-04-16 LAB — CBC
HCT: 36.1 % — ABNORMAL LOW (ref 39.0–52.0)
MCH: 27.8 pg (ref 26.0–34.0)
MCV: 84.3 fL (ref 78.0–100.0)
Platelets: 151 10*3/uL (ref 150–400)
RBC: 4.28 MIL/uL (ref 4.22–5.81)
WBC: 6.9 10*3/uL (ref 4.0–10.5)

## 2013-04-16 LAB — BASIC METABOLIC PANEL
BUN: 22 mg/dL (ref 6–23)
CO2: 24 mEq/L (ref 19–32)
Calcium: 8.9 mg/dL (ref 8.4–10.5)
Creatinine, Ser: 1.25 mg/dL (ref 0.50–1.35)
GFR calc non Af Amer: 52 mL/min — ABNORMAL LOW (ref >90)

## 2013-04-16 LAB — LIPID PANEL
Cholesterol: 105 mg/dL (ref 0–200)
HDL: 42 mg/dL (ref >39)
Total CHOL/HDL Ratio: 2.5 RATIO

## 2013-04-16 LAB — HEPARIN LEVEL (UNFRACTIONATED): Heparin Unfractionated: 0.86 IU/mL — ABNORMAL HIGH (ref 0.30–0.70)

## 2013-04-16 SURGERY — LEFT HEART CATHETERIZATION WITH CORONARY ANGIOGRAM
Anesthesia: LOCAL

## 2013-04-16 MED ORDER — ALUM & MAG HYDROXIDE-SIMETH 200-200-20 MG/5ML PO SUSP: 30.0000 mL | ORAL | Status: DC | PRN

## 2013-04-16 MED ORDER — HEPARIN SODIUM (PORCINE) 1000 UNIT/ML IJ SOLN
INTRAMUSCULAR | Status: AC
  Filled 2013-04-16: qty 1

## 2013-04-16 MED ORDER — HEPARIN (PORCINE) IN NACL 100-0.45 UNIT/ML-% IJ SOLN
950.0000 [IU]/h | INTRAMUSCULAR | Status: DC
  Administered 2013-04-16: 800 [IU]/h via INTRAVENOUS
  Administered 2013-04-17: 07:00:00 950 [IU]/h via INTRAVENOUS
  Filled 2013-04-16: qty 250

## 2013-04-16 MED ORDER — SODIUM CHLORIDE 0.9 % IJ SOLN: 3.0000 mL | INTRAMUSCULAR | Status: DC | PRN

## 2013-04-16 MED ORDER — NITROGLYCERIN 0.2 MG/ML ON CALL CATH LAB
INTRAVENOUS | Status: AC
  Filled 2013-04-16: qty 1

## 2013-04-16 MED ORDER — SODIUM CHLORIDE 0.9 % IV SOLN: 1.0000 mL/kg/h | INTRAVENOUS | Status: AC

## 2013-04-16 MED ORDER — CLOPIDOGREL BISULFATE 75 MG PO TABS
75.0000 mg | ORAL_TABLET | Freq: Every day | ORAL | Status: DC
  Administered 2013-04-17: 75 mg via ORAL
  Filled 2013-04-16: qty 1

## 2013-04-16 MED ORDER — SODIUM CHLORIDE 0.9 % IV SOLN: INTRAVENOUS | Status: DC

## 2013-04-16 MED ORDER — SODIUM CHLORIDE 0.9 % IV SOLN
250.0000 mL | INTRAVENOUS | Status: DC | PRN
  Administered 2013-04-16: 23:00:00 250 mL via INTRAVENOUS

## 2013-04-16 MED ORDER — LIDOCAINE HCL (PF) 1 % IJ SOLN
INTRAMUSCULAR | Status: AC
  Filled 2013-04-16: qty 30

## 2013-04-16 MED ORDER — MIDAZOLAM HCL 2 MG/2ML IJ SOLN
INTRAMUSCULAR | Status: AC
  Filled 2013-04-16: qty 2

## 2013-04-16 MED ORDER — VERAPAMIL HCL 2.5 MG/ML IV SOLN
INTRAVENOUS | Status: AC
  Filled 2013-04-16: qty 2

## 2013-04-16 MED ORDER — HEPARIN (PORCINE) IN NACL 100-0.45 UNIT/ML-% IJ SOLN
800.0000 [IU]/h | INTRAMUSCULAR | Status: DC
  Filled 2013-04-16: qty 250

## 2013-04-16 MED ORDER — SODIUM CHLORIDE 0.9 % IJ SOLN
3.0000 mL | Freq: Two times a day (BID) | INTRAMUSCULAR | Status: DC
  Administered 2013-04-16: 3 mL via INTRAVENOUS

## 2013-04-16 MED ORDER — HEPARIN (PORCINE) IN NACL 2-0.9 UNIT/ML-% IJ SOLN
INTRAMUSCULAR | Status: AC
  Filled 2013-04-16: qty 1000

## 2013-04-16 MED ORDER — STUDY - INVESTIGATIONAL DRUG SIMPLE RECORD
7.5000 mg | Freq: Two times a day (BID) | Status: DC
  Administered 2013-04-16 – 2013-04-17 (×3): 7.5 mg via ORAL
  Filled 2013-04-16 (×3): qty 7.5

## 2013-04-16 MED ORDER — SODIUM CHLORIDE 0.9 % IV SOLN: 250.0000 mL | INTRAVENOUS | Status: DC | PRN

## 2013-04-16 MED ORDER — FENTANYL CITRATE 0.05 MG/ML IJ SOLN
INTRAMUSCULAR | Status: AC
  Filled 2013-04-16: qty 2

## 2013-04-16 MED ORDER — ALUM & MAG HYDROXIDE-SIMETH 200-200-20 MG/5ML PO SUSP
30.0000 mL | Freq: Four times a day (QID) | ORAL | Status: DC | PRN
  Administered 2013-04-16 (×2): 30 mL via ORAL
  Filled 2013-04-16 (×2): qty 30

## 2013-04-16 MED ORDER — CLOPIDOGREL BISULFATE 75 MG PO TABS
300.0000 mg | ORAL_TABLET | Freq: Once | ORAL | Status: AC
  Administered 2013-04-16: 300 mg via ORAL
  Filled 2013-04-16: qty 4

## 2013-04-16 NOTE — ED Provider Notes (Signed)
  This was a shared visit with a mid-level provided (NP or PA).  Throughout the patient's course I was available for consultation/collaboration.  I saw the ECG (if appropriate), relevant labs and studies - I agree with the interpretation.  On my exam the patient was in no distress.  However, with his description of pain that began following insertion, his cardiac history, he was admitted for further evaluation and management.      Gerhard Munch, MD 04/16/13 2009

## 2013-04-16 NOTE — Interval H&P Note (Signed)
History and Physical Interval Note:  04/16/2013 12:01 PM  Corey Larson.  has presented today for surgery, with the diagnosis of NSTEMI. The various methods of treatment have been discussed with the patient and family. After consideration of risks, benefits and other options for treatment, the patient has consented to  Procedure(s): LEFT HEART CATHETERIZATION WITH CORONARY ANGIOGRAM (N/A) +/- PCI as a surgical intervention .  The patient's history has been reviewed, patient examined, no change in status, stable for surgery.  I have reviewed the patient's chart and labs.  Questions were answered to the patient's satisfaction.     HARDING,DAVID W Cath Lab Visit (complete for each Cath Lab visit)  Clinical Evaluation Leading to the Procedure:   ACS: yes  Non-ACS:    Anginal Classification: CCS IV  Anti-ischemic medical therapy: Maximal Therapy (2 or more classes of medications)  Non-Invasive Test Results: No non-invasive testing performed  Prior CABG: No previous CABG

## 2013-04-16 NOTE — Progress Notes (Signed)
Subjective: CP free on IV heparin and NTG patch.   Objective: Vital signs in last 24 hours: Temp:  [97.9 F (36.6 C)-98.5 F (36.9 C)] 98.4 F (36.9 C) (11/13 0454) Pulse Rate:  [56-64] 56 (11/13 0454) Resp:  [10-20] 20 (11/13 0454) BP: (135-164)/(71-87) 143/74 mmHg (11/13 0454) SpO2:  [99 %-100 %] 100 % (11/13 0454) Weight:  [172 lb 9.6 oz (78.291 kg)] 172 lb 9.6 oz (78.291 kg) (11/12 1929) Last BM Date: 04/14/13  Intake/Output from previous day: 11/12 0701 - 11/13 0700 In: 468.2 [P.O.:360; I.V.:108.2] Out: 300 [Urine:300] Intake/Output this shift:    Medications Current Facility-Administered Medications  Medication Dose Route Frequency Provider Last Rate Last Dose  . 0.9 %  sodium chloride infusion   Intravenous Continuous Nada Boozer, NP 20 mL/hr at 04/15/13 1854    . acetaminophen (TYLENOL) tablet 650 mg  650 mg Oral Q4H PRN Nada Boozer, NP      . ALPRAZolam Prudy Feeler) tablet 0.25 mg  0.25 mg Oral BID PRN Nada Boozer, NP      . alum & mag hydroxide-simeth (MAALOX/MYLANTA) 200-200-20 MG/5ML suspension 30 mL  30 mL Oral Q6H PRN Runell Gess, MD   30 mL at 04/16/13 0052  . aspirin EC tablet 81 mg  81 mg Oral QHS Nada Boozer, NP   81 mg at 04/15/13 2203  . gabapentin (NEURONTIN) capsule 800 mg  800 mg Oral TID Nada Boozer, NP   800 mg at 04/15/13 2204  . heparin ADULT infusion 100 units/mL (25000 units/250 mL)  1,000 Units/hr Intravenous Continuous Otho Bellows, RPH 10 mL/hr at 04/15/13 2023 1,000 Units/hr at 04/15/13 2023  . nebivolol (BYSTOLIC) tablet 5 mg  5 mg Oral QHS Nada Boozer, NP   5 mg at 04/15/13 2203  . nitroGLYCERIN (NITROGLYN) 2 % ointment 0.5 inch  0.5 inch Topical Once Jillyn Ledger, PA-C      . nitroGLYCERIN (NITROGLYN) 2 % ointment 0.5 inch  0.5 inch Topical Q8H Nada Boozer, NP   0.5 inch at 04/16/13 0601  . nitroGLYCERIN (NITROSTAT) SL tablet 0.4 mg  0.4 mg Sublingual Q5 Min x 3 PRN Nada Boozer, NP      . ondansetron [facility]) injection 4 mg   4 mg Intravenous Q6H PRN Nada Boozer, NP      . simvastatin (ZOCOR) tablet 40 mg  40 mg Oral QHS Nada Boozer, NP      . zolpidem (AMBIEN) tablet 5 mg  5 mg Oral QHS PRN Nada Boozer, NP        PE: General appearance: alert, cooperative and no distress Lungs: clear to auscultation bilaterally Heart: regular rate and rhythm Extremities: no LEE Pulses: 2+ and symmetric Skin: warm and dry Neurologic: Grossly normal  Lab Results:   Recent Labs  04/15/13 1500 04/16/13 0503  WBC 9.6 6.9  HGB 13.6 11.9*  HCT 41.2 36.1*  PLT 165 151   BMET  Recent Labs  04/15/13 1500 04/16/13 0503  NA 137 140  K 4.3 3.8  CL 104 106  CO2 20 24  GLUCOSE 100* 93  BUN 23 22  CREATININE 1.56* 1.25  CALCIUM 10.1 8.9   PT/INR  Recent Labs  04/15/13 1846  LABPROT 13.6  INR 1.06   Cardiac Panel (last 3 results)  Recent Labs  04/15/13 1846 04/16/13 04/16/13 0503  TROPONINI <0.30 1.04* 1.45*    Assessment/Plan  Principal Problem:   Anginal chest pain at rest Active Problems:   CAD (coronary artery disease),  Hx of LAD DES in 2005   Peripheral neuropathy   Hyperlipidemia LDL goal < 70  Plan: 79 y/o male with known CAD, s/p PCI + DES to LAD in 2005, has ruled in for NSTEMI with 2nd and 3rd troponins returning positive at 1.04 and 1.45 respectively. He is currently CP free on IV heparin and NTG patch. EKG NSR with PACs. No ischemic changes. BP stable. Sinus brady on telemetry. HR in the mid 50s. Renal function normal. INR 1.06. Will likely post for Cath today +/- PCI. Will discuss with MD. If cath elected for today will place orders and pt will need transfer to [facility]. If cath cannot get done today, will post for tomorrow AM. Keep NPO for now until a decision is made. Continue IV heparin, NTG, ASA, BB and statin.     LOS: 1 day    Brittainy M. Delmer Islam 04/16/2013 7:44 AM  I have seen and evaluated the patient this AM along with Boyce Medici, PA. I agree with her findings,  examination as well as impression recommendations.  Pt with known CAD - prior PCI.  Presented with prolonged SSCP, ruled in for NSTEMI.  Plan is LHC +/- PCI today.  The procedure with Risks/Benefits/Alternatives and Indications was reviewed with the patient & wife.  All questions were answered.    Risks / Complications include, but not limited to: Death, MI, CVA/TIA, VF/VT (with defibrillation), Bradycardia (need for temporary pacer placement), contrast induced nephropathy, bleeding / bruising / hematoma / pseudoaneurysm, vascular or coronary injury (with possible emergent CT or Vascular Surgery), adverse medication reactions, infection.    The patient (and wife) voice understanding and agree to proceed.    Marykay Lex, M.D., M.S. [facility] [address] [place], [place]  [address]  [phone] Pager # [phone] 04/16/2013 11:59 AM

## 2013-04-16 NOTE — Progress Notes (Signed)
ANTICOAGULATION CONSULT NOTE - Follow Up Consult  Pharmacy Consult for heparin Indication: ACS s/p cath  No Known Allergies  Patient Measurements: Height: 5' 6.5" (168.9 cm) Weight: 172 lb 9.6 oz (78.291 kg) IBW/kg (Calculated) : 64.95 Heparin Dosing Weight: 78 kg  Vital Signs: Temp: 97.5 F (36.4 C) (11/13 1335) Temp src: Oral (11/13 1335) BP: 127/63 mmHg (11/13 1335) Pulse Rate: 61 (11/13 1335)  Labs:  Recent Labs  04/15/13 1500 04/15/13 1846 04/16/13 04/16/13 0503  HGB 13.6  --   --  11.9*  HCT 41.2  --   --  36.1*  PLT 165  --   --  151  APTT  --  27  --   --   LABPROT  --  13.6  --   --   INR  --  1.06  --   --   HEPARINUNFRC  --   --   --  0.86*  CREATININE 1.56*  --   --  1.25  TROPONINI  --  <0.30 1.04* 1.45*    Estimated Creatinine Clearance: 45.3 ml/min (by C-G formula based on Cr of 1.25).   Medications:  Scheduled:  . aspirin EC  81 mg Oral QHS  . gabapentin  800 mg Oral TID  . Latitude-TIMI study- Placebo / Losmapimod  (PI- Stuckey )  7.5 mg Oral BID  . nebivolol  5 mg Oral QHS  . nitroGLYCERIN  0.5 inch Topical Once  . nitroGLYCERIN  0.5 inch Topical Q8H  . simvastatin  40 mg Oral QHS   Infusions:  . sodium chloride 20 mL/hr at 04/15/13 1854  . heparin Stopped (04/16/13 0930)    Assessment: 79 yo male with ACS s/p cath will be restarted on heparin 6hrs post sheath removal.  Per RN, the sheath will be removed at 1515.    Goal of Therapy:  Heparin level 0.3-0.7 units/ml Monitor platelets by anticoagulation protocol: Yes   Plan:  1) Restart heparin at 800 units/hr at 2115.  No bolus 2) Check an 8hr heparin level after drip is restarted 3) Daily heparin level and CBC  Anders Hohmann, Tsz-Yin 04/16/2013,1:52 PM

## 2013-04-16 NOTE — Progress Notes (Signed)
CRITICAL VALUE ALERT  Critical value received:  Troponin 1.04  Date of notification:  04/16/13  Time of notification:  0113  Critical value read back:yes  Nurse who received alert:  Linward Natal, RN  MD notified (1st page):  [place]  Time of first page:  0115  MD notified (2nd page):  Time of second page:  Responding MD:  Adolm Joseph  Time MD responded:  0117  On call doctor was made aware of troponin level. RN told doctor that patient was on a heparin drip of 10 and had gotten a bolus of heparin at 2030. Doctor was also made aware that patient had a nitro patch on. Doctor gave RN orders to make patient NPO and stated he would make the attending doctor aware. Will continue to monitor patient.

## 2013-04-16 NOTE — Progress Notes (Signed)
ANTICOAGULATION CONSULT NOTE - Follow Up Consult  Pharmacy Consult for Heparin Indication: chest pain/ACS  No Known Allergies  Patient Measurements: Height: 5' 6.5" (168.9 cm) Weight: 172 lb 9.6 oz (78.291 kg) IBW/kg (Calculated) : 64.95 Heparin Dosing Weight: 78kg  Vital Signs: Temp: 98.4 F (36.9 C) (11/13 0454) Temp src: Oral (11/13 0454) BP: 143/74 mmHg (11/13 0454) Pulse Rate: 56 (11/13 0454)  Labs:  Recent Labs  04/15/13 1500 04/15/13 1846 04/16/13 04/16/13 0503  HGB 13.6  --   --  11.9*  HCT 41.2  --   --  36.1*  PLT 165  --   --  151  APTT  --  27  --   --   LABPROT  --  13.6  --   --   INR  --  1.06  --   --   HEPARINUNFRC  --   --   --  0.86*  CREATININE 1.56*  --   --  1.25  TROPONINI  --  <0.30 1.04* 1.45*    Estimated Creatinine Clearance: 45.3 ml/min (by C-G formula based on Cr of 1.25).   Medications:  Scheduled:  . aspirin EC  81 mg Oral QHS  . gabapentin  800 mg Oral TID  . nebivolol  5 mg Oral QHS  . nitroGLYCERIN  0.5 inch Topical Once  . nitroGLYCERIN  0.5 inch Topical Q8H  . simvastatin  40 mg Oral QHS   Infusions:  . sodium chloride 20 mL/hr at 04/15/13 1854  . heparin 1,000 Units/hr (04/15/13 2023)    Assessment: 79 y.o. male with a PMH of HTN, HLD, and CAD s/p stent 2005 who presents to the ED for evaluation of chest pain. IV heparin started 11/12 ~8p. Initial troponin negative but now rising. Plans for cardiac cath today.  First heparin level supratherapeutic (0.86)  CBC slightly decreased, but no bleeding reported  Goal of Therapy:  Heparin level 0.3-0.7 units/ml Monitor platelets by anticoagulation protocol: Yes   Plan:   Decrease heparin to 800 units/hr  Recheck heparin level in 8 hours  F/U plans for cardiac cath  Loralee Pacas, PharmD, BCPS Pager: [phone] 04/16/2013,7:54 AM

## 2013-04-16 NOTE — CV Procedure (Signed)
CARDIAC CATHETERIZATION REPORT  NAME:  Corey Larson.   MRN: [id number] DOB:  [birth year]   ADMIT DATE: 04/15/2013 Procedure Date: 04/16/2013  INTERVENTIONAL CARDIOLOGIST: Marykay Lex, M.D., MS PRIMARY CARE PROVIDER: Thayer Headings, MD PRIMARY CARDIOLOGIST: Lennette Bihari, M.D.  PATIENT:  Corey Larson. is a 79 y.o. male with a history of the LAD PCI with a Taxus DES 3.5 mm x 20 mm in 2005 by Dr. Aleen Campi, along with hypertension and hyperlipidemia, who presented to [facility] Emergency Room following sudden onset chest pain shortly after mowing the lawn. The pain is located centrally in abutment diffusely across the chest radiating to his left arm and neck. Quite consistent with angina. The symptoms did not resolve until he arrived to the emergency room was treated with nitroglycerin and aspirin. All told the symptoms lasted roughly 2 hours. They were associated with dyspnea and diaphoresis. Upon arrival to emergent, he is relatively stable. He was admitted with plans to rule out for MI on IV heparin and determine the course of action of cath versus Myoview based on the results of his initial labs. Overnight it ruled in for non-ST elevation MI, has remained chest pain-free. He now presents for invasive evaluation based on the recommendations. Catheterization PCI at this time was determined to be Appropriate based on the Appropriateness of Use Criteria.  PRE-OPERATIVE DIAGNOSIS:    Non-ST elevation MI  Known LAD PCI  PROCEDURES PERFORMED:    Left Heart Catheterization with Coronary Angiography  PROCEDURE:Consent:  Risks of procedure as well as the alternatives and risks of each were explained to the (patient/caregiver).  Consent for procedure obtained. Consent for signed by MD and patient with RN witness -- placed on chart.  PROCEDURE: The patient was brought to the 2nd Floor [facility] in the fasting state and prepped and draped in the usual  sterile fashion for Right groin or radial access. A modified Allen's test with plethysmography was performed, revealing excellent Ulnar artery collateral flow.  Sterile technique was used including antiseptics, cap, gloves, gown, hand hygiene, mask and sheet.  Skin prep: Chlorhexidine.  Time Out: Verified patient identification, verified procedure, site/side was marked, verified correct patient position, special equipment/implants available, medications/allergies/relevent history reviewed, required imaging and test results available.  Performed  Access: Right Radial Artery; 6 Fr Sheath -- Seldinger technique (Angiocath Micropuncture Kit)  IA Radial Cocktail, IV Heparin Diagnostic:  TIG 4.0,Angled Pigtail  Left and Right Coronary Artery Angiography: TIG 4.0  LV Hemodynamics (LV Gram): Angled pigtail  TR Band:  1300 Hours, 12 mL air  MEDICATIONS:  Anesthesia:  Local Lidocaine 2 ml  Sedation:  1 mg IV Versed, 25 mcg IV fentanyl ;   Omnipaque Contrast: 85 ml  Anticoagulation:  IV Heparin 4000 Units ; Radial Cocktail: 5 mg Verapamil, 400 mcg NTG, 2 ml 2% Lidocaine in 10 ml NS  Hemodynamics:  Central Aortic / Mean Pressures: 98/48 mmHg  Left Ventricular Pressures / EDP: 98/2 mmHg; 9 mmHg  Left Ventriculography:  EF: 60-65 %  Wall Motion: Normal global function  Coronary Anatomy:  Left Main: Large-caliber, short vessel that trifurcates into the LAD, Circumflex, and small Ramus Intermedius LAD: Large caliber vessel that wraps the apex perfusing the inferoapex. There are 3 smaller moderate caliber diagonal branches.  Just after a major first septal perforator trunk, there is a widely patent stent from 2005, the stent does jail the first diagonal.  There is minimal luminal irregularities in the entire LAD  system.  Left Circumflex: Large caliber vessel that essentially terminates as a large lateral OM with 2 smaller OM branch the cough that along with a small AV groove branch. The  vessel remains relatively large in caliber until he gives off a final branch where it then tapers down to the inferolateral apex. Angiographically normal.  Ramus intermedius: Moderate caliber vessel, it is at least the same size as D1, and courses as a short, high OM1.    RCA: Large caliber, dominant vessel. There is a irregular 50-60% lesion in the first bend of the vessel before it then normalizes to give rise to 2 RV marginal branches. Bifurcates distally into the Right Posterior Descending Artery (RPDA) and the  Right Posterior AV Groove Branch (RPAV). Angiographically normal beyond the initial lesion.  RPDA: Moderate caliber vessel that reaches two thirds the way to the apex. Tortuous but angiographically normal.  RPL Sysytem:The RPAV small-caliber vessel it gives rise to 3 small RPL branches.  PATIENT DISPOSITION:    The patient was transferred to the PACU holding area in a hemodynamicaly stable, chest pain free condition.  The patient tolerated the procedure well, and there were no complications.  EBL:   < 10 ml  The patient was stable before, during, and after the procedure.  POST-OPERATIVE DIAGNOSIS:    Moderate proximal RCA lesion, it may very well have been the culprit lesion. At this time it does not appear to be flow limiting, and therefore not appropriate for PCI.  Normal LV function with normal EF.  PLAN OF CARE:  Based on the positive troponins, he will be monitored tonight with IV heparin restarted 6 hours post TR band removal.  Medical therapy for moderate RCA lesion with adding Plavix, and consider a nitrate.  Monitor over night, annulate in the morning. If he remains stable with no recurrence of angina, and consider discharge in the morning.  If he is having recurrent chest discomfort, the only potential culprit lesion being the RCA lesion, therefore would consider FFR guided PCI on this vessel.  Results were discussed with the patient's wife via telephone. Dr.  [name] is also aware.   Marykay Lex, M.D., M.S. [facility] [address] [place], [place]  [address]  [phone]  04/16/2013 1:17 PM

## 2013-04-16 NOTE — Research (Signed)
LATTITUDE Informed Consent   Subject Name: Corey Larson.  Subject met inclusion and exclusion criteria.  The informed consent form, study requirements and expectations were reviewed with the subject and questions and concerns were addressed prior to the signing of the consent form.  The subject verbalized understanding of the trail requirements.  The subject agreed to participate in the LATTITUDE trial and signed the informed consent.  The informed consent was obtained prior to performance of any protocol-specific procedures for the subject.  A copy of the signed informed consent was given to the subject and a copy was placed in the subject's medical record.  Cherrie Distance 04/16/2013, 10:30am

## 2013-04-16 NOTE — Progress Notes (Addendum)
Patient had a critical lab value of troponin this morning of 1.45, On call PA was notified, and PA stated that they would be putting in orders for the cath lab today. PA also stated that one of them would call into his room or come up early to let the patient know what is going to happen today and why.  Patient's vital signs are WNL and patient not complaining of any shortness of breath or chest pain. Will continue to monitor patient.

## 2013-04-17 DIAGNOSIS — I214 Non-ST elevation (NSTEMI) myocardial infarction: Secondary | ICD-10-CM

## 2013-04-17 DIAGNOSIS — I2510 Atherosclerotic heart disease of native coronary artery without angina pectoris: Secondary | ICD-10-CM | POA: Diagnosis not present

## 2013-04-17 DIAGNOSIS — E785 Hyperlipidemia, unspecified: Secondary | ICD-10-CM | POA: Diagnosis not present

## 2013-04-17 DIAGNOSIS — I208 Other forms of angina pectoris: Secondary | ICD-10-CM | POA: Diagnosis not present

## 2013-04-17 LAB — CBC
MCH: 28.1 pg (ref 26.0–34.0)
MCHC: 33.1 g/dL (ref 30.0–36.0)
MCV: 84.8 fL (ref 78.0–100.0)
Platelets: 142 10*3/uL — ABNORMAL LOW (ref 150–400)
RDW: 15.9 % — ABNORMAL HIGH (ref 11.5–15.5)
WBC: 6.7 10*3/uL (ref 4.0–10.5)

## 2013-04-17 LAB — HEPARIN LEVEL (UNFRACTIONATED): Heparin Unfractionated: 0.22 IU/mL — ABNORMAL LOW (ref 0.30–0.70)

## 2013-04-17 LAB — TROPONIN I: Troponin I: 0.5 ng/mL (ref <0.30)

## 2013-04-17 MED ORDER — NITROGLYCERIN 0.4 MG SL SUBL: 0.4000 mg | SUBLINGUAL_TABLET | SUBLINGUAL | Status: DC | PRN

## 2013-04-17 MED ORDER — STUDY - INVESTIGATIONAL DRUG SIMPLE RECORD: 7.5000 mg | Freq: Two times a day (BID) | Status: DC

## 2013-04-17 MED ORDER — ISOSORBIDE MONONITRATE ER 30 MG PO TB24: 30.0000 mg | ORAL_TABLET | Freq: Every day | ORAL | Status: DC

## 2013-04-17 MED ORDER — HEART ATTACK BOUNCING BOOK
Freq: Once | Status: AC
  Administered 2013-04-17: 07:00:00
  Filled 2013-04-17: qty 1

## 2013-04-17 MED ORDER — CLOPIDOGREL BISULFATE 75 MG PO TABS: 75.0000 mg | ORAL_TABLET | Freq: Every day | ORAL | Status: DC

## 2013-04-17 NOTE — Discharge Summary (Signed)
The patient looked great today prior to d/c.  No further Angina or DOE while walking around in the hall.   He was ready for discharge -- was counseled to "take it easy" until seen in f/u.  If he has recurrent Sx - would consider potentially intervening on moderate RCA (FFR or IVUS guided)  Added Plavix & Imdur.  Marykay Lex, M.D., M.S. [facility] [address] [place], [place]  [address]  [phone] Pager # [phone] 04/17/2013 5:56 PM

## 2013-04-17 NOTE — Progress Notes (Signed)
Subjective: No complaints.  Objective: Vital signs in last 24 hours: Temp:  [97.5 F (36.4 C)-98.6 F (37 C)] 98 F (36.7 C) (11/14 0805) Pulse Rate:  [44-69] 56 (11/14 0805) Resp:  [18-20] 18 (11/14 0805) BP: (97-127)/(48-73) 123/73 mmHg (11/14 0805) SpO2:  [91 %-100 %] 98 % (11/14 0805) Weight:  [171 lb 8.3 oz (77.8 kg)] 171 lb 8.3 oz (77.8 kg) (11/14 0432) Last BM Date: 04/16/13  Intake/Output from previous day: 11/13 0701 - 11/14 0700 In: 1934.9 [P.O.:800; I.V.:1134.9] Out: 1500 [Urine:1500] Intake/Output this shift:    Medications Current Facility-Administered Medications  Medication Dose Route Frequency Provider Last Rate Last Dose  . 0.9 %  sodium chloride infusion  250 mL Intravenous PRN Marykay Lex, MD 10 mL/hr at 04/16/13 2240 250 mL at 04/16/13 2240  . acetaminophen (TYLENOL) tablet 650 mg  650 mg Oral Q4H PRN Nada Boozer, NP      . ALPRAZolam Prudy Feeler) tablet 0.25 mg  0.25 mg Oral BID PRN Nada Boozer, NP      . alum & mag hydroxide-simeth (MAALOX/MYLANTA) 200-200-20 MG/5ML suspension 30 mL  30 mL Oral Q6H PRN Runell Gess, MD   30 mL at 04/16/13 2102  . aspirin EC tablet 81 mg  81 mg Oral QHS Nada Boozer, NP   81 mg at 04/16/13 2228  . clopidogrel (PLAVIX) tablet 75 mg  75 mg Oral Q breakfast Marykay Lex, MD      . gabapentin (NEURONTIN) capsule 800 mg  800 mg Oral TID Nada Boozer, NP   800 mg at 04/16/13 2228  . heparin ADULT infusion 100 units/mL (25000 units/250 mL)  950 Units/hr Intravenous Continuous Abran Duke, RPH 9.5 mL/hr at 04/17/13 0648 950 Units/hr at 04/17/13 0648  . Latitude-TIMI study- Placebo / Losmapimod  (PI- Stuckey )  7.5 mg Oral BID Runell Gess, MD   7.5 mg at 04/17/13 0845  . nebivolol (BYSTOLIC) tablet 5 mg  5 mg Oral QHS Nada Boozer, NP   5 mg at 04/16/13 2228  . nitroGLYCERIN (NITROGLYN) 2 % ointment 0.5 inch  0.5 inch Topical Q8H Nada Boozer, NP   0.5 inch at 04/17/13 0554  . nitroGLYCERIN (NITROSTAT) SL tablet  0.4 mg  0.4 mg Sublingual Q5 Min x 3 PRN Nada Boozer, NP      . ondansetron [facility]) injection 4 mg  4 mg Intravenous Q6H PRN Nada Boozer, NP      . simvastatin (ZOCOR) tablet 40 mg  40 mg Oral QHS Nada Boozer, NP      . sodium chloride 0.9 % injection 3 mL  3 mL Intravenous Q12H Marykay Lex, MD   3 mL at 04/16/13 2104  . sodium chloride 0.9 % injection 3 mL  3 mL Intravenous PRN Marykay Lex, MD      . zolpidem [facility]) tablet 5 mg  5 mg Oral QHS PRN Nada Boozer, NP        PE: General appearance: alert, cooperative and no distress Lungs: clear to auscultation bilaterally Heart: regular rate and rhythm, S1, S2 normal, no murmur, click, rub or gallop Extremities: No LEE Pulses: 2+ and symmetric Skin: Warm and dry.  Right wrist cath site looks good.  minimal ecchymosis. Neurologic: Grossly normal  Lab Results:   Recent Labs  04/15/13 1500 04/16/13 0503 04/17/13 0525  WBC 9.6 6.9 6.7  HGB 13.6 11.9* 11.5*  HCT 41.2 36.1* 34.7*  PLT 165 151 142*   BMET  Recent Labs  04/15/13 1500 04/16/13 0503  NA 137 140  K 4.3 3.8  CL 104 106  CO2 20 24  GLUCOSE 100* 93  BUN 23 22  CREATININE 1.56* 1.25  CALCIUM 10.1 8.9   PT/INR  Recent Labs  04/15/13 1846  LABPROT 13.6  INR 1.06   Cholesterol  Recent Labs  04/16/13 0503  CHOL 105   Lipid Panel     Component Value Date/Time   CHOL 105 04/16/2013 0503   TRIG 65 04/16/2013 0503   HDL 42 04/16/2013 0503   CHOLHDL 2.5 04/16/2013 0503   VLDL 13 04/16/2013 0503   LDLCALC 50 04/16/2013 0503    Assessment/Plan  Principal Problem:   Anginal chest pain at rest Active Problems:   CAD (coronary artery disease), Hx of LAD DES in 2005   Peripheral neuropathy   Hyperlipidemia LDL goal < 70   NSTEMI (non-ST elevated myocardial infarction)  Plan:  SP coronary angiography revealing a moderate prox RCA lesion.  It did not appear to be flow limiting.  Plavix was added. No further CP. BP and HR stable.  I would  avoid increasing bystolic as he does have some bradycardia.  Consider long acting nitrates if he has any angina. DC home today.     LOS: 2 days    HAGER, BRYAN 04/17/2013 8:51 AM  I have seen and evaluated the patient this AM along with Wilburt Finlay, PA. I agree with his findings, examination as well as impression recommendations.  No recurrence of angina.  Ambulated in the hallway without angina off of NTG & Heparin.    Moderate RCA disease on cath - but no other "culprit lesion".   I discussed the post MI complication risks with the patient & wife - mechanical & arrhythmic.  He has been stable on tele, no angina or CHF.  I think he should be fine for d/c home today on combination ASA/Plavix (for at least 1 month, then Plavix alone to complete 1 yr).  Will also add Imdur qhs. Is already on BB & statin.  With normal EF, do not need ACE-I in absence of HTN.   Marykay Lex, M.D., M.S. [facility] [address] [place], [place]  [address]  [phone] Pager # [phone] 04/17/2013 11:04 AM

## 2013-04-17 NOTE — Progress Notes (Signed)
CARDIAC REHAB PHASE I   PRE:  Rate/Rhythm: 65 SR  BP:  Supine:   Sitting: 137/88  Standing:    SaO2: 96% RA  MODE:  Ambulation: 700 ft   POST:  Rate/Rhythm: 78 SR  BP:  Supine:   Sitting: 138/67  Standing:    SaO2: 97% RA   Patient tolerated ambulation well independently without chest pain or angina symptoms.  Education completed re: angina sx, NTG usage, home exercise plan, and limitations post MI.  Patient was very receptive and able to teach back instructions to me.  Discuss phase II cardiac rehab and patient is reluctant to join, states he is a member of a gym and wants to exercise on his own.  Given endpoints for exercise and temperature precautions.   Cindra Eves RN, BSN [phone] 04/17/2013

## 2013-04-17 NOTE — Progress Notes (Signed)
ANTICOAGULATION CONSULT NOTE - Follow Up Consult  Pharmacy Consult for Heparin  Indication: chest pain/ACS, s/p cath  No Known Allergies  Patient Measurements: Height: 5' 6.5" (168.9 cm) Weight: 171 lb 8.3 oz (77.8 kg) IBW/kg (Calculated) : 64.95  Vital Signs: Temp: 97.9 F (36.6 C) (11/14 0432) Temp src: Oral (11/14 0432) BP: 117/63 mmHg (11/14 0432) Pulse Rate: 61 (11/14 0432)  Labs:  Recent Labs  04/15/13 1500  04/15/13 1846 04/16/13 04/16/13 0503 04/16/13 2010 04/17/13 0525  HGB 13.6  --   --   --  11.9*  --  11.5*  HCT 41.2  --   --   --  36.1*  --  34.7*  PLT 165  --   --   --  151  --  142*  APTT  --   --  27  --   --   --   --   LABPROT  --   --  13.6  --   --   --   --   INR  --   --  1.06  --   --   --   --   HEPARINUNFRC  --   --   --   --  0.86*  --  0.22*  CREATININE 1.56*  --   --   --  1.25  --   --   TROPONINI  --   < > <0.30 1.04* 1.45* 0.76*  --   < > = values in this interval not displayed.  Estimated Creatinine Clearance: 41.9 ml/min (by C-G formula based on Cr of 1.25).   Medications:  Heparin 800 units/hr  Assessment: 79 y/o M with heparin restarted at 800 units/hr post-cath. HL is 0.22, other labs as above.   Goal of Therapy:  Heparin level 0.3-0.7 units/ml Monitor platelets by anticoagulation protocol: Yes   Plan:  -Increase heparin to 950 units/hr -8 hour HL at 1500, if not discharged -Daily CBC/HL -Monitor for bleeding -Possible DC in AM per CARDS  Thank you for allowing me to take part in this patient's care,  Abran Duke, PharmD Clinical Pharmacist Phone: [phone] Pager: [phone] 04/17/2013 6:31 AM

## 2013-04-17 NOTE — Discharge Summary (Signed)
Physician Discharge Summary     Patient ID: Corey Larson. MRN: [id number] DOB/AGE: 12-12-1927 45 y.o.  Admit date: 04/15/2013 Discharge date: 04/17/2013  Admission Diagnoses:  Unstable angina  Discharge Diagnoses:  Principal Problem:   NSTEMI (non-ST elevated myocardial infarction) Active Problems:   Anginal chest pain at rest   CAD (coronary artery disease), Hx of LAD DES in 2005   Peripheral neuropathy   Hyperlipidemia LDL goal < 70   Discharged Condition: stable  Hospital Course:  Corey Larson. is a 79 y.o. male with a PMH of HTN, HLD, and CAD s/p stent 2005 who presented to the ED for evaluation of chest pain. History was provided by the patient. Patient states that he developed sudden onset chest pain at 1:45 PM while mowing his lawn. His chest pain was located diffusely across his chest with radiation down his left arm and up the left side of his neck. His chest pain improved since onset. He described a pressure sensation. He stated he had similar chest pain years ago after golfing and resulted in a stent. Associated symptoms with his chest pain include diaphoresis and SOB which has resolved. He chewed a 324 mg aspirin at home prior to arrival. He still has mild chest pain. Pain lasted approx 2 hours. He is a previous tobacco user. No significant FH of cardiac disease. Patient's cardiologist is Dr. [name] with [facility]. He has a stress test scheduled in December but denied any recent cardiac work-up. Last stress test was 12/13 and stable.   The patient was admitted to rule out MI.  He was started on IV heparin and NTG paste.  He ended up ruling in with a peak troponin of 1.45 and underwent coronary angiography which revealed a moderate prox RCA lesion. It did not appear to be flow limiting.  LV function is normal.  Plavix was added along with Imdur 30mg .  If he has recurrent symptoms, PCI with FFR may be required.  The patient was seen by Dr. Herbie Baltimore who  felt she was stable for DC home.   Consults: None  Significant Diagnostic Studies:    Hemodynamics:  Central Aortic / Mean Pressures: 98/48 mmHg  Left Ventricular Pressures / EDP: 98/2 mmHg; 9 mmHg Left Ventriculography:  EF: 60-65 %  Wall Motion: Normal global function Coronary Anatomy:  Left Main: Large-caliber, short vessel that trifurcates into the LAD, Circumflex, and small Ramus Intermedius LAD: Large caliber vessel that wraps the apex perfusing the inferoapex. There are 3 smaller moderate caliber diagonal branches.  Just after a major first septal perforator trunk, there is a widely patent stent from 2005, the stent does jail the first diagonal.  There is minimal luminal irregularities in the entire LAD system. Left Circumflex: Large caliber vessel that essentially terminates as a large lateral OM with 2 smaller OM branch the cough that along with a small AV groove branch. The vessel remains relatively large in caliber until he gives off a final branch where it then tapers down to the inferolateral apex. Angiographically normal.  Ramus intermedius: Moderate caliber vessel, it is at least the same size as D1, and courses as a short, high OM1.  RCA: Large caliber, dominant vessel. There is a irregular 50-60% lesion in the first bend of the vessel before it then normalizes to give rise to 2 RV marginal branches. Bifurcates distally into the Right Posterior Descending Artery (RPDA) and the Right Posterior AV Groove Branch (RPAV). Angiographically normal beyond the initial  lesion.  RPDA: Moderate caliber vessel that reaches two thirds the way to the apex. Tortuous but angiographically normal.  RPL Sysytem:The RPAV small-caliber vessel it gives rise to 3 small RPL branches. PATIENT DISPOSITION:  The patient was transferred to the PACU holding area in a hemodynamicaly stable, chest pain free condition.  The patient tolerated the procedure well, and there were no complications. EBL: < 10 ml    The patient was stable before, during, and after the procedure. POST-OPERATIVE DIAGNOSIS:  Moderate proximal RCA lesion, it may very well have been the culprit lesion. At this time it does not appear to be flow limiting, and therefore not appropriate for PCI.  Normal LV function with normal EF. PLAN OF CARE:  Based on the positive troponins, he will be monitored tonight with IV heparin restarted 6 hours post TR band removal.  Medical therapy for moderate RCA lesion with adding Plavix, and consider a nitrate.  Monitor over night, annulate in the morning. If he remains stable with no recurrence of angina, and consider discharge in the morning.  If he is having recurrent chest discomfort, the only potential culprit lesion being the RCA lesion, therefore would consider FFR guided PCI on this vessel. Results were discussed with the patient's wife via telephone. Dr. [name] is also aware.  Corey Larson, M.D., M.S.  [facility]    CHEST 2 VIEW  COMPARISON: 10/26/2009  FINDINGS: Heart size and pulmonary vascularity are normal and the lungs are clear. No acute osseous abnormality. The patient now has a moderate hiatal hernia which was not apparent on the prior exam.  IMPRESSION: No acute abnormality. New moderate hiatal hernia. Electronically Signed  Treatments:  See above  Discharge Exam: Blood pressure 133/71, pulse 57, temperature 98.1 F (36.7 C), temperature source Oral, resp. rate 18, height 5' 6.5" (1.689 m), weight 171 lb 8.3 oz (77.8 kg), SpO2 98.00%.   Disposition:       Discharge Orders   Future Appointments Provider Department Dept Phone   04/27/2013 11:40 AM Corey Finlay, PA-C [facility] [phone]   Future Orders Complete By Expires   Diet - low sodium heart healthy  As directed    Discharge instructions  As directed    Comments:     No lifting with right arm for three days.   Increase activity slowly  As directed         Medication List         aspirin EC 81 MG tablet  Take 81 mg by mouth at bedtime.     B-complex with vitamin C tablet  Take 1 tablet by mouth daily.     cholecalciferol 1000 UNITS tablet  Commonly known as:  VITAMIN D  Take 1,000 Units by mouth daily.     clopidogrel 75 MG tablet  Commonly known as:  PLAVIX  Take 1 tablet (75 mg total) by mouth daily with breakfast.     gabapentin 800 MG tablet  Commonly known as:  NEURONTIN  Take 800 mg by mouth 3 (three) times daily.     ICAPS Tabs  Take 1 tablet by mouth daily.     INVESTIGATIONAL DRUG SIMPLE RECORD  Take 7.5 mg by mouth 2 (two) times daily.     isosorbide mononitrate 30 MG 24 hr tablet  Commonly known as:  IMDUR  Take 1 tablet (30 mg total) by mouth daily.     nebivolol 5 MG tablet  Commonly known as:  BYSTOLIC  Take 5 mg by mouth at bedtime.     nitroGLYCERIN 0.4 MG SL tablet  Commonly known as:  NITROSTAT  Place 1 tablet (0.4 mg total) under the tongue every 5 (five) minutes x 3 doses as needed for chest pain.     simvastatin 40 MG tablet  Commonly known as:  ZOCOR  Take 1 tablet (40 mg total) by mouth at bedtime.       Follow-up Information   Follow up with Corey Ober, PA-C On 04/27/2013. (11:40 )    Specialty:  Physician Assistant   Contact information:   [address] [place] [address] [phone]       Signed: Wilburt Larson 04/17/2013, 1:17 PM

## 2013-04-23 DIAGNOSIS — H905 Unspecified sensorineural hearing loss: Secondary | ICD-10-CM | POA: Diagnosis not present

## 2013-04-26 DIAGNOSIS — M542 Cervicalgia: Secondary | ICD-10-CM | POA: Diagnosis not present

## 2013-04-27 ENCOUNTER — Ambulatory Visit (INDEPENDENT_AMBULATORY_CARE_PROVIDER_SITE_OTHER): Payer: Medicare Other | Admitting: Physician Assistant

## 2013-04-27 ENCOUNTER — Encounter: Payer: Self-pay | Admitting: Physician Assistant

## 2013-04-27 VITALS — BP 120/80 | HR 79 | Ht 66.0 in | Wt 174.0 lb

## 2013-04-27 DIAGNOSIS — E785 Hyperlipidemia, unspecified: Secondary | ICD-10-CM

## 2013-04-27 DIAGNOSIS — I2510 Atherosclerotic heart disease of native coronary artery without angina pectoris: Secondary | ICD-10-CM | POA: Diagnosis not present

## 2013-04-27 NOTE — Assessment & Plan Note (Signed)
Treated with Zocor. 

## 2013-04-27 NOTE — Progress Notes (Signed)
Date:  04/27/2013   ID:  Corey Larson., DOB 03/04/1928, MRN [id number]  PCP:  Thayer Headings, MD  Primary Cardiologist:  Bishop Limbo     History of Present Illness: Corey Larson. is a 79 y.o. male with a PMH of HTN, HLD, and CAD s/p stent 2005 who presented to the ED for evaluation of chest pain. History was provided by the patient. Patient states that he developed sudden onset chest pain at 1:45 PM while mowing his lawn. His chest pain was located diffusely across his chest with radiation down his left arm and up the left side of his neck. His chest pain improved since onset. He described a pressure sensation. He stated he had similar chest pain years ago after golfing and resulted in a stent. Associated symptoms with his chest pain include diaphoresis and SOB which has resolved. He chewed a 324 mg aspirin at home prior to arrival. He still has mild chest pain. Pain lasted approx 2 hours. He is a previous tobacco user. No significant FH of cardiac disease. Patient's cardiologist is Dr. [name] with [facility]. He has a stress test scheduled in December but denied any recent cardiac work-up. Last stress test was 12/13 and stable.   The patient was admitted to rule out MI. He was started on IV heparin and NTG paste. He ended up ruling in with a peak troponin of 1.45 and underwent coronary angiography which revealed a moderate prox RCA lesion. It did not appear to be flow limiting. LV function is normal. Plavix was added along with Imdur 30mg . If he has recurrent symptoms, PCI with FFR may be required.   The patient presents today for post hospital evaluation.  He did develop a stiff neck about a week ago after waking.  He was seen yesterday at Urgent Care for this and given prednisone.  He reports no further CP and also denies nausea, vomiting, fever, shortness of breath, orthopnea, dizziness, PND, cough, congestion, abdominal pain, hematochezia, melena, lower extremity edema,  claudication.  Wt Readings from Last 3 Encounters:  04/27/13 174 lb (78.926 kg)  04/17/13 171 lb 8.3 oz (77.8 kg)  04/17/13 171 lb 8.3 oz (77.8 kg)     Past Medical History  Diagnosis Date  . CAD (coronary artery disease) 2005    stent to LAD by Dr. Aleen Campi. mild irregulatities of RCA.    . OSA (obstructive sleep apnea)     mild, never on cpap  . Hyperlipidemia LDL goal < 70   . H/O exercise stress test 05/2012    no statistically significant ischemia.   . Peripheral neuropathy   . Macular degeneration     bilateral    Current Outpatient Prescriptions  Medication Sig Dispense Refill  . B Complex-C (B-COMPLEX WITH VITAMIN C) tablet Take 1 tablet by mouth daily.      . cholecalciferol (VITAMIN D) 1000 UNITS tablet Take 1,000 Units by mouth daily.      . clopidogrel (PLAVIX) 75 MG tablet Take 1 tablet (75 mg total) by mouth daily with breakfast.  30 tablet  11  . gabapentin (NEURONTIN) 800 MG tablet Take 800 mg by mouth 3 (three) times daily.      . INVESTIGATIONAL DRUG SIMPLE RECORD Take 7.5 mg by mouth 2 (two) times daily.  60 mg  2  . isosorbide mononitrate (IMDUR) 30 MG 24 hr tablet Take 1 tablet (30 mg total) by mouth daily.  30 tablet  5  .  Multiple Vitamins-Minerals (ICAPS) TABS Take 1 tablet by mouth daily.      . nebivolol (BYSTOLIC) 5 MG tablet Take 5 mg by mouth at bedtime.      . nitroGLYCERIN (NITROSTAT) 0.4 MG SL tablet Place 1 tablet (0.4 mg total) under the tongue every 5 (five) minutes x 3 doses as needed for chest pain.  25 tablet  12  . simvastatin (ZOCOR) 40 MG tablet Take 1 tablet (40 mg total) by mouth at bedtime.  30 tablet  6   No current facility-administered medications for this visit.    Allergies:   No Known Allergies  Social History:  The patient  reports that he quit smoking about 30 years ago. His smoking use included Cigarettes and Pipe. He smoked 0.00 packs per day for 30 years. He has never used smokeless tobacco. He reports that he drinks  alcohol. He reports that he does not use illicit drugs.   Family history:  No family history on file.  ROS:  Please see the history of present illness.  All other systems reviewed and negative.   PHYSICAL EXAM: VS:  BP 120/80  Pulse 79  Ht 5\' 6"  (1.676 m)  Wt 174 lb (78.926 kg)  BMI 28.10 kg/m2 Well nourished, well developed, in no acute distress HEENT: Pupils are equal round react to light accommodation extraocular movements are intact.  Neck: no JVDNo cervical lymphadenopathy. Cardiac: Regular rate and rhythm without murmurs rubs or gallops. Lungs:  clear to auscultation bilaterally, no wheezing, rhonchi or rales Ext: no lower extremity edema.  2+ radial and dorsalis pedis pulses. Skin: warm and dry.  Right Wrist cath site healing well. Neuro:  Grossly normal  EKG:    NSR 79 BPM.  ASSESSMENT AND PLAN:  Problem List Items Addressed This Visit   Hyperlipidemia LDL goal < 70 (Chronic)     Treated with Zocor.    CAD (coronary artery disease), Hx of LAD DES in 2005 - Primary     No further CP.  Taking Imdur, Plavix, bystolic.  I have asked him to not use Vigra while on Imdur.  He can follow up with Dr. [name] in three months

## 2013-04-27 NOTE — Patient Instructions (Signed)
1.  Hold off on Viagra until seen by Dr. [name] in 3 months. 2.  Ok to resume exercise.  Increase it slowly as tolerated.

## 2013-04-27 NOTE — Assessment & Plan Note (Signed)
No further CP.  Taking Imdur, Plavix, bystolic.  I have asked him to not use Vigra while on Imdur.  He can follow up with Dr. [name] in three months

## 2013-04-28 ENCOUNTER — Encounter: Payer: Self-pay | Admitting: Physician Assistant

## 2013-05-08 ENCOUNTER — Other Ambulatory Visit: Payer: Self-pay | Admitting: Physician Assistant

## 2013-05-08 ENCOUNTER — Telehealth: Payer: Self-pay | Admitting: Physician Assistant

## 2013-05-08 DIAGNOSIS — I2510 Atherosclerotic heart disease of native coronary artery without angina pectoris: Secondary | ICD-10-CM

## 2013-05-08 NOTE — Telephone Encounter (Signed)
Pt walked in today and stated that he saw Judie Grieve for a hospital follow up. Pt would like an order to have a 6 week rehab class at the cardiac center at [facility]. Please advise pt and return his call @ [phone]. Ok to leave message.

## 2013-05-20 ENCOUNTER — Telehealth: Payer: Self-pay | Admitting: Cardiovascular Disease

## 2013-05-20 NOTE — Telephone Encounter (Signed)
Please get more information about what she is calling about. I am not familiar with them.

## 2013-05-20 NOTE — Telephone Encounter (Signed)
Please have Burna Mortimer to call her concerning Eesa Justiss please.

## 2013-05-20 NOTE — Telephone Encounter (Signed)
Message forwarded to W. Waddell, CMA.  

## 2013-05-21 ENCOUNTER — Telehealth: Payer: Self-pay | Admitting: *Deleted

## 2013-05-21 NOTE — Telephone Encounter (Signed)
Faxed signed cardiac rehab phase II order back. 

## 2013-05-22 DIAGNOSIS — M1710 Unilateral primary osteoarthritis, unspecified knee: Secondary | ICD-10-CM | POA: Diagnosis not present

## 2013-05-25 ENCOUNTER — Encounter (INDEPENDENT_AMBULATORY_CARE_PROVIDER_SITE_OTHER): Payer: Self-pay

## 2013-05-25 DIAGNOSIS — R0989 Other specified symptoms and signs involving the circulatory and respiratory systems: Secondary | ICD-10-CM

## 2013-05-26 DIAGNOSIS — H43819 Vitreous degeneration, unspecified eye: Secondary | ICD-10-CM | POA: Diagnosis not present

## 2013-05-26 DIAGNOSIS — H353290 Exudative age-related macular degeneration, unspecified eye, stage unspecified: Secondary | ICD-10-CM | POA: Diagnosis not present

## 2013-05-26 DIAGNOSIS — H353190 Nonexudative age-related macular degeneration, unspecified eye, stage unspecified: Secondary | ICD-10-CM | POA: Diagnosis not present

## 2013-06-02 ENCOUNTER — Encounter (HOSPITAL_COMMUNITY)
Admission: RE | Admit: 2013-06-02 | Discharge: 2013-06-02 | Disposition: A | Discharge status: Home / Self Care | Payer: Medicare Other | Source: Ambulatory Visit | Attending: Cardiovascular Disease | Admitting: Cardiovascular Disease

## 2013-06-02 NOTE — Progress Notes (Signed)
Cardiac Rehab Medication Review by a Pharmacist  Does the patient  feel that his/her medications are working for him/her?  yes  Has the patient been experiencing any side effects to the medications prescribed?  no  Does the patient measure his/her own blood pressure or blood glucose at home?  no   Does the patient have any problems obtaining medications due to transportation or finances?   no  Understanding of regimen: good Understanding of indications: good Potential of compliance: fair  Pharmacist comments: Corey Larson was a pleasant 79 yo M presenting to cardiac rehab.  He had a list of medications with him that was incomplete, but we discussed all his meds and was able to record all he takes.  He does not monitor his BP at home.  He reports no side effects from his meds.  The only meds he reports not being "too disciplined" about taking is the vitamins (ICAPs, B complex, Vit D).  He takes those once daily if he remembers them.  All others, he reports no issues with compliance.    Corey Larson, PharmD Clinical Pharmacist - Resident Pager: [phone] Pharmacy: [phone] 06/02/2013 8:56 AM

## 2013-06-05 DIAGNOSIS — IMO0002 Reserved for concepts with insufficient information to code with codable children: Secondary | ICD-10-CM | POA: Diagnosis not present

## 2013-06-22 ENCOUNTER — Encounter (HOSPITAL_COMMUNITY)
Admission: RE | Admit: 2013-06-22 | Discharge: 2013-06-22 | Disposition: A | Discharge status: Home / Self Care | Payer: Medicare Other | Source: Ambulatory Visit | Attending: Cardiovascular Disease | Admitting: Cardiovascular Disease

## 2013-06-22 ENCOUNTER — Encounter (HOSPITAL_COMMUNITY): Payer: Self-pay

## 2013-06-22 DIAGNOSIS — E785 Hyperlipidemia, unspecified: Secondary | ICD-10-CM | POA: Diagnosis not present

## 2013-06-22 DIAGNOSIS — I2510 Atherosclerotic heart disease of native coronary artery without angina pectoris: Secondary | ICD-10-CM | POA: Insufficient documentation

## 2013-06-22 DIAGNOSIS — Z7982 Long term (current) use of aspirin: Secondary | ICD-10-CM | POA: Diagnosis not present

## 2013-06-22 DIAGNOSIS — G609 Hereditary and idiopathic neuropathy, unspecified: Secondary | ICD-10-CM | POA: Insufficient documentation

## 2013-06-22 DIAGNOSIS — G4733 Obstructive sleep apnea (adult) (pediatric): Secondary | ICD-10-CM | POA: Insufficient documentation

## 2013-06-22 DIAGNOSIS — Z7902 Long term (current) use of antithrombotics/antiplatelets: Secondary | ICD-10-CM | POA: Insufficient documentation

## 2013-06-22 DIAGNOSIS — Z5189 Encounter for other specified aftercare: Secondary | ICD-10-CM | POA: Diagnosis not present

## 2013-06-22 DIAGNOSIS — Z9861 Coronary angioplasty status: Secondary | ICD-10-CM | POA: Insufficient documentation

## 2013-06-22 DIAGNOSIS — Z87891 Personal history of nicotine dependence: Secondary | ICD-10-CM | POA: Insufficient documentation

## 2013-06-22 DIAGNOSIS — I214 Non-ST elevation (NSTEMI) myocardial infarction: Secondary | ICD-10-CM | POA: Insufficient documentation

## 2013-06-22 DIAGNOSIS — K449 Diaphragmatic hernia without obstruction or gangrene: Secondary | ICD-10-CM | POA: Diagnosis not present

## 2013-06-22 DIAGNOSIS — Z79899 Other long term (current) drug therapy: Secondary | ICD-10-CM | POA: Diagnosis not present

## 2013-06-22 NOTE — Progress Notes (Signed)
Pt started cardiac rehab today.  Pt tolerated light exercise without difficulty.  Vss, telemetry-NSR, NSSTTwave changes.  Aysmptomatic.  PHQ-0.  No psychosocial barriers to exercise participation.  Pt exhibits positive coping skills with good family support. Pt oriented to exercise equipment and routine.  Understanding verbalized.

## 2013-06-24 ENCOUNTER — Encounter (HOSPITAL_COMMUNITY): Payer: Medicare Other

## 2013-06-26 ENCOUNTER — Encounter (HOSPITAL_COMMUNITY)
Admission: RE | Admit: 2013-06-26 | Discharge: 2013-06-26 | Disposition: A | Discharge status: Home / Self Care | Payer: Medicare Other | Source: Ambulatory Visit | Attending: Cardiovascular Disease | Admitting: Cardiovascular Disease

## 2013-06-29 ENCOUNTER — Encounter (HOSPITAL_COMMUNITY)
Admission: RE | Admit: 2013-06-29 | Discharge: 2013-06-29 | Disposition: A | Discharge status: Home / Self Care | Payer: Medicare Other | Source: Ambulatory Visit | Attending: Cardiovascular Disease | Admitting: Cardiovascular Disease

## 2013-07-01 ENCOUNTER — Encounter (HOSPITAL_COMMUNITY)
Admission: RE | Admit: 2013-07-01 | Discharge: 2013-07-01 | Disposition: A | Discharge status: Home / Self Care | Payer: Medicare Other | Source: Ambulatory Visit | Attending: Cardiovascular Disease | Admitting: Cardiovascular Disease

## 2013-07-03 ENCOUNTER — Encounter (HOSPITAL_COMMUNITY)
Admission: RE | Admit: 2013-07-03 | Discharge: 2013-07-03 | Disposition: A | Discharge status: Home / Self Care | Payer: Medicare Other | Source: Ambulatory Visit | Attending: Cardiovascular Disease | Admitting: Cardiovascular Disease

## 2013-07-06 ENCOUNTER — Encounter (HOSPITAL_COMMUNITY)
Admission: RE | Admit: 2013-07-06 | Discharge: 2013-07-06 | Disposition: A | Discharge status: Home / Self Care | Payer: Medicare Other | Source: Ambulatory Visit | Attending: Cardiovascular Disease | Admitting: Cardiovascular Disease

## 2013-07-06 DIAGNOSIS — Z9861 Coronary angioplasty status: Secondary | ICD-10-CM | POA: Insufficient documentation

## 2013-07-06 DIAGNOSIS — I252 Old myocardial infarction: Secondary | ICD-10-CM | POA: Insufficient documentation

## 2013-07-06 DIAGNOSIS — Z5189 Encounter for other specified aftercare: Secondary | ICD-10-CM | POA: Diagnosis not present

## 2013-07-06 DIAGNOSIS — I498 Other specified cardiac arrhythmias: Secondary | ICD-10-CM | POA: Diagnosis not present

## 2013-07-06 DIAGNOSIS — Z7982 Long term (current) use of aspirin: Secondary | ICD-10-CM | POA: Diagnosis not present

## 2013-07-06 DIAGNOSIS — Z79899 Other long term (current) drug therapy: Secondary | ICD-10-CM | POA: Insufficient documentation

## 2013-07-06 DIAGNOSIS — I2510 Atherosclerotic heart disease of native coronary artery without angina pectoris: Secondary | ICD-10-CM | POA: Diagnosis not present

## 2013-07-06 DIAGNOSIS — Z87891 Personal history of nicotine dependence: Secondary | ICD-10-CM | POA: Diagnosis not present

## 2013-07-06 DIAGNOSIS — E785 Hyperlipidemia, unspecified: Secondary | ICD-10-CM | POA: Insufficient documentation

## 2013-07-06 DIAGNOSIS — Z7902 Long term (current) use of antithrombotics/antiplatelets: Secondary | ICD-10-CM | POA: Diagnosis not present

## 2013-07-06 DIAGNOSIS — E783 Hyperchylomicronemia: Secondary | ICD-10-CM | POA: Diagnosis not present

## 2013-07-08 ENCOUNTER — Encounter (HOSPITAL_COMMUNITY)
Admission: RE | Admit: 2013-07-08 | Discharge: 2013-07-08 | Disposition: A | Discharge status: Home / Self Care | Payer: Medicare Other | Source: Ambulatory Visit | Attending: Cardiovascular Disease | Admitting: Cardiovascular Disease

## 2013-07-10 ENCOUNTER — Encounter (HOSPITAL_COMMUNITY)
Admission: RE | Admit: 2013-07-10 | Discharge: 2013-07-10 | Disposition: A | Discharge status: Home / Self Care | Payer: Medicare Other | Source: Ambulatory Visit | Attending: Cardiovascular Disease | Admitting: Cardiovascular Disease

## 2013-07-13 ENCOUNTER — Encounter (HOSPITAL_COMMUNITY)
Admission: RE | Admit: 2013-07-13 | Discharge: 2013-07-13 | Disposition: A | Discharge status: Home / Self Care | Payer: Medicare Other | Source: Ambulatory Visit | Attending: Cardiovascular Disease | Admitting: Cardiovascular Disease

## 2013-07-15 ENCOUNTER — Encounter (HOSPITAL_COMMUNITY)
Admission: RE | Admit: 2013-07-15 | Discharge: 2013-07-15 | Disposition: A | Discharge status: Home / Self Care | Payer: Medicare Other | Source: Ambulatory Visit | Attending: Cardiovascular Disease | Admitting: Cardiovascular Disease

## 2013-07-15 NOTE — Progress Notes (Signed)
Corey Larson. 80 y.o. male Nutrition Note Spoke with pt.  Nutrition Plan and Nutrition Survey goals reviewed with pt. Pt is not following the Therapeutic Lifestyle Changes diet. Age-appropriate diet discussed. Pt wants to lose wt. Pt has not been actively trying to lose wt. Per pt, he and his wife are planning to restart Weight Watcher's. Wt loss tips reviewed. Pt is pre-diabetic and is aware of pre-diabetes. Pt expressed understanding of the information reviewed. Pt aware of nutrition education classes offered.  Nutrition Diagnosis   Food-and nutrition-related knowledge deficit related to lack of exposure to information as related to diagnosis of: ? CVD ? Pre-DM (A1c 6.0) ?   Overweight related to excessive energy intake as evidenced by a BMI of 28.1    Nutrition RX/ Estimated Daily Nutrition Needs for: wt loss  1400-1650 Kcal, 35-45 gm fat, 9-11 gm sat fat, 1.3-1.6 gm trans-fat, <1500 mg sodium   Nutrition Intervention   Pt's individual nutrition plan including cholesterol goals reviewed with pt.   Benefits of adopting Therapeutic Lifestyle Changes discussed when Medficts reviewed.   Pt to attend the Portion Distortion class   Pt to attend the  ? Nutrition I class - met; 07/07/13                    ? Nutrition II class   Continue client-centered nutrition education by RD, as part of interdisciplinary care.  Goal(s)   Pt to identify food quantities necessary to achieve: ? wt loss to a goal wt of 165-168 lb (75-76.2 kg) at graduation from cardiac rehab.    Pt to describe the benefit of including fruits, vegetables, whole grains, and low-fat dairy products in a heart healthy meal plan.  Monitor and Evaluate progress toward nutrition goal with team. Nutrition Risk:  Low   Derek Mound, M.Ed, RD, LDN, CDE 07/15/2013 3:38 PM

## 2013-07-17 ENCOUNTER — Encounter (HOSPITAL_COMMUNITY)
Admission: RE | Admit: 2013-07-17 | Discharge: 2013-07-17 | Disposition: A | Discharge status: Home / Self Care | Payer: Medicare Other | Source: Ambulatory Visit | Attending: Cardiovascular Disease | Admitting: Cardiovascular Disease

## 2013-07-20 ENCOUNTER — Encounter (HOSPITAL_COMMUNITY)
Admission: RE | Admit: 2013-07-20 | Discharge: 2013-07-20 | Disposition: A | Discharge status: Home / Self Care | Payer: Medicare Other | Source: Ambulatory Visit | Attending: Cardiovascular Disease | Admitting: Cardiovascular Disease

## 2013-07-20 NOTE — Progress Notes (Signed)
Reviewed home exercise with pt today.  Pt plans to walk and go to [employer] for exercise.  Reviewed THR, pulse, RPE, sign and symptoms, NTG use, and when to call 911 or MD.  Pt voiced understanding. Alberteen Sam, MA, ACSM RCEP

## 2013-07-22 ENCOUNTER — Encounter (HOSPITAL_COMMUNITY)
Admission: RE | Admit: 2013-07-22 | Discharge: 2013-07-22 | Disposition: A | Discharge status: Home / Self Care | Payer: Medicare Other | Source: Ambulatory Visit | Attending: Cardiovascular Disease | Admitting: Cardiovascular Disease

## 2013-07-23 ENCOUNTER — Encounter: Payer: Self-pay | Admitting: Cardiovascular Disease

## 2013-07-23 ENCOUNTER — Ambulatory Visit (INDEPENDENT_AMBULATORY_CARE_PROVIDER_SITE_OTHER): Payer: Medicare Other | Admitting: Cardiovascular Disease

## 2013-07-23 VITALS — BP 98/72 | HR 72 | Ht 66.0 in | Wt 170.7 lb

## 2013-07-23 DIAGNOSIS — E559 Vitamin D deficiency, unspecified: Secondary | ICD-10-CM | POA: Diagnosis not present

## 2013-07-23 DIAGNOSIS — I2510 Atherosclerotic heart disease of native coronary artery without angina pectoris: Secondary | ICD-10-CM | POA: Diagnosis not present

## 2013-07-23 DIAGNOSIS — E663 Overweight: Secondary | ICD-10-CM | POA: Diagnosis not present

## 2013-07-23 DIAGNOSIS — I214 Non-ST elevation (NSTEMI) myocardial infarction: Secondary | ICD-10-CM

## 2013-07-23 DIAGNOSIS — G4733 Obstructive sleep apnea (adult) (pediatric): Secondary | ICD-10-CM | POA: Diagnosis not present

## 2013-07-23 DIAGNOSIS — N529 Male erectile dysfunction, unspecified: Secondary | ICD-10-CM

## 2013-07-23 DIAGNOSIS — I10 Essential (primary) hypertension: Secondary | ICD-10-CM | POA: Diagnosis not present

## 2013-07-23 DIAGNOSIS — E785 Hyperlipidemia, unspecified: Secondary | ICD-10-CM

## 2013-07-23 DIAGNOSIS — Z0000 Encounter for general adult medical examination without abnormal findings: Secondary | ICD-10-CM | POA: Diagnosis not present

## 2013-07-23 DIAGNOSIS — Z1331 Encounter for screening for depression: Secondary | ICD-10-CM | POA: Diagnosis not present

## 2013-07-23 DIAGNOSIS — M109 Gout, unspecified: Secondary | ICD-10-CM | POA: Diagnosis not present

## 2013-07-23 MED ORDER — AMLODIPINE BESYLATE 2.5 MG PO TABS: 2.5000 mg | ORAL_TABLET | Freq: Every day | ORAL | Status: DC

## 2013-07-23 NOTE — Patient Instructions (Addendum)
Your physician has recommended you make the following change in your medication: STOP THE ISOSORBIDE. Start the new prescription given for amlodipine. This has already been sent to the pharmacy. Restart a baby aspirin.   Your physician recommends that you schedule a follow-up appointment in: 4 MONTHS.

## 2013-07-23 NOTE — Progress Notes (Signed)
Patient ID: Corey Larson., male   DOB: [birth year], 80 y.o.   MRN: [id number]     HPI: Corey Larson. is a 80 y.o. male who presents to the office for cardiology evaluation. I last saw him over one year ago in December 2013.  Corey Larson has a history of established coronary artery disease and in 2005 underwent stenting of a 95% focal LAD stenosis by Dr. Glade Lloyd. At that time he did have mild irregularities of his RCA. He does have a history of mild obstructive sleep apnea into his cardiovascular comorbidities was referred for CPAP titration but he never did get followup with getting a unit. He does have a history of hyperlipidemia, as well as some erectile dysfunction.  Apparently on April 15, 2013 he had been cutting the grass and noticed chest pressure. He ultimately presented to [facility] hospital and ruled in for a NSTEMI. Following day he underwent cardiac catheterization by Dr. Ellyn Hack and was found to have a widely patent LAD stent. There were minimal luminal irregularities in the LAD system. The circumflex vessel is normal. The right eye artery had an irregular 50-60% lesion the first bend of the vessel. Medical therapy was recommended. He was started on Plavix in addition to isosorbide mononitrate and saw Corey Larson in followup in the office on 04/27/2013. He apparently had been scheduled for a nuclear stress test in December but this was not done.  Corey Larson has been participating in cardiac rehabilitation. He denies any further episodes of chest pressure. He has been taking Bystolic 5 mg, simvastatin 20 mg, isosorbide mononitrate 30 mg in addition to Plavix 75 mg blood inadvertently stopped taking aspirin. He also is concerned about erectile function issues and would like the opportunity to at times take Viagra but has not done so because of nitrate therapy.  Past Medical History  Diagnosis Date  . CAD (coronary artery disease) 2005    stent to LAD by Dr. Glade Lloyd. mild  irregulatities of RCA.    . OSA (obstructive sleep apnea)     mild, never on cpap  . Hyperlipidemia LDL goal < 70   . H/O exercise stress test 05/2012    no statistically significant ischemia.   . Peripheral neuropathy   . Macular degeneration     bilateral    Past Surgical History  Procedure Laterality Date  . Cardiac catheterization      2005, stent placed DES LAD  . Knee surgery      right knee 2011   . Ankle surgery      right ankle 2002  . Coronary angioplasty    . Back surgery  1976    laminectomy  . Hernia repair  1981    "double hernia"    No Known Allergies  Current Outpatient Prescriptions  Medication Sig Dispense Refill  . B Complex-C (B-COMPLEX WITH VITAMIN C) tablet Take 1 tablet by mouth daily.      . cholecalciferol (VITAMIN D) 1000 UNITS tablet Take 1,000 Units by mouth daily.      . clopidogrel (PLAVIX) 75 MG tablet Take 1 tablet (75 mg total) by mouth daily with breakfast.  30 tablet  11  . gabapentin (NEURONTIN) 800 MG tablet Take 800 mg by mouth 3 (three) times daily.      . nitroGLYCERIN (NITROSTAT) 0.4 MG SL tablet Place 1 tablet (0.4 mg total) under the tongue every 5 (five) minutes x 3 doses as needed for chest pain.  25 tablet  12  . simvastatin (ZOCOR) 40 MG tablet Take 20 mg by mouth at bedtime.       No current facility-administered medications for this visit.    History   Social History  . Marital Status: Married    Spouse Name: N/A    Number of Children: N/A  . Years of Education: N/A   Occupational History  . Not on file.   Social History Main Topics  . Smoking status: Former Smoker -- 30 years    Types: Cigarettes, Pipe    Quit date: 04/16/1983  . Smokeless tobacco: Never Used  . Alcohol Use: Yes     Comment: rarely  . Drug Use: No  . Sexual Activity: Not on file   Other Topics Concern  . Not on file   Social History Narrative  . No narrative on file    History reviewed. No pertinent family history.  ROS is negative  for fevers, chills or night sweats.  He denies headaches. Denies visual changes. He does have a hearing aid in the right ear. He denies cough or wheezing or sputum production. There is no lymphadenopathy. He denies recurrent chest pain. There is no shortness of breath. He does participate at the cardiac rehabilitation program. He denies nausea vomiting or diarrhea. Dies claudication. There is no edema. There is no diabetes. He denies cold or heat intolerance. The past he was diagnosed with mild sleep apnea but has not been using CPAP therapy. He does have difficulty with erectile function.  Other comprehensive 14 point system review is negative.  PE BP 98/72  Pulse 72  Ht 5\' 6"  (1.676 m)  Wt 170 lb 11.2 oz (77.429 kg)  BMI 27.56 kg/m2  General: Alert, oriented, no distress.  Skin: normal turgor, no rashes HEENT: Normocephalic, atraumatic. Pupils round and reactive; sclera anicteric;no lid lag. Extraocular muscles intact;; no xanthelasmas. Nose without nasal septal hypertrophy Mouth/Parynx benign; Mallinpatti scale 3 Neck: No JVD, no carotid bruits; normal carotid upstroke Lungs: clear to ausculatation and percussion; no wheezing or rales Chest wall: no tenderness to palpitation Heart: RRR, s1 s2 normal; 1/6 systolic murmur; no diastolic murmur, rub thrills or heaves Abdomen: soft, nontender; no hepatosplenomehaly, BS+; abdominal aorta nontender and not dilated by palpation. Back: no CVA tenderness Pulses 2+ Extremities: no clubbing cyanosis or edema, Homan's sign negative  Neurologic: grossly nonfocal; cranial nerves grossly normal. Psychologic: normal affect and mood.  ECG (independently read by me): Sinus rhythm at 72 beats per minute. No ectopy.  LABS:  BMET    Component Value Date/Time   NA 140 04/16/2013 0503   K 3.8 04/16/2013 0503   CL 106 04/16/2013 0503   CO2 24 04/16/2013 0503   GLUCOSE 93 04/16/2013 0503   BUN 22 04/16/2013 0503   CREATININE 1.25 04/16/2013 0503    CALCIUM 8.9 04/16/2013 0503   GFRNONAA 52* 04/16/2013 0503   GFRAA 60* 04/16/2013 0503     Hepatic Function Panel     Component Value Date/Time   PROT 6.3 04/16/2013 0503   ALBUMIN 3.3* 04/16/2013 0503   AST 25 04/16/2013 0503   ALT 12 04/16/2013 0503   ALKPHOS 47 04/16/2013 0503   BILITOT 1.3* 04/16/2013 0503   BILIDIR 0.2 04/16/2013 0503   IBILI 1.1* 04/16/2013 0503     CBC    Component Value Date/Time   WBC 6.7 04/17/2013 0525   RBC 4.09* 04/17/2013 0525   HGB 11.5* 04/17/2013 0525   HCT 34.7* 04/17/2013 0525   PLT 142*  04/17/2013 0525   MCV 84.8 04/17/2013 0525   MCH 28.1 04/17/2013 0525   MCHC 33.1 04/17/2013 0525   RDW 15.9* 04/17/2013 0525     BNP No results found for this basename: probnp    Lipid Panel     Component Value Date/Time   CHOL 105 04/16/2013 0503   TRIG 65 04/16/2013 0503   HDL 42 04/16/2013 0503   CHOLHDL 2.5 04/16/2013 0503   VLDL 13 04/16/2013 0503   LDLCALC 50 04/16/2013 0503     RADIOLOGY: No results found.    ASSESSMENT AND PLAN: Corey Larson is an 80 year old gentleman who is now 10 years status post PCI to a high-grade 95% focal LAD stenosis. He recently suffered a small non-ST segment elevation MI in the culprit lesion was felt to be 50-60% which was not significantly obstructive and at that time medical therapy was recommended. He has not had recurrent symptomatology. He has been taking Plavix but is inadvertently did not take aspirin. I've suggested he resume aspirin 81 mg daily to take with his Plavix. He is wanting the opportunity to use Viagra if at all possible. He is not having anginal symptoms. I will discontinue his isosorbide mononitrate and will start him on low-dose amlodipine 2.5 mg. He is taking simvastatin 20 mg for his hyperlipidemia. He did have laboratory drawn today and a fasting state. I will try to obtain these results from Dr. Alyson Ingles. He does have documented mild obstructive sleep apnea, but is not  utilizing CPAP therapy. I will see him in 4 months for cardiology reevaluation.     Troy Sine, MD, [facility]  07/23/2013 10:38 AM

## 2013-07-24 ENCOUNTER — Encounter (HOSPITAL_COMMUNITY)
Admission: RE | Admit: 2013-07-24 | Discharge: 2013-07-24 | Disposition: A | Discharge status: Home / Self Care | Payer: Medicare Other | Source: Ambulatory Visit | Attending: Cardiovascular Disease | Admitting: Cardiovascular Disease

## 2013-07-27 ENCOUNTER — Encounter (HOSPITAL_COMMUNITY)
Admission: RE | Admit: 2013-07-27 | Discharge: 2013-07-27 | Disposition: A | Discharge status: Home / Self Care | Payer: Medicare Other | Source: Ambulatory Visit | Attending: Cardiovascular Disease | Admitting: Cardiovascular Disease

## 2013-07-29 ENCOUNTER — Encounter (HOSPITAL_COMMUNITY)
Admission: RE | Admit: 2013-07-29 | Discharge: 2013-07-29 | Disposition: A | Discharge status: Home / Self Care | Payer: Medicare Other | Source: Ambulatory Visit | Attending: Cardiovascular Disease | Admitting: Cardiovascular Disease

## 2013-07-29 DIAGNOSIS — I2510 Atherosclerotic heart disease of native coronary artery without angina pectoris: Secondary | ICD-10-CM | POA: Diagnosis not present

## 2013-07-29 DIAGNOSIS — N183 Chronic kidney disease, stage 3 unspecified: Secondary | ICD-10-CM | POA: Diagnosis not present

## 2013-07-29 DIAGNOSIS — I10 Essential (primary) hypertension: Secondary | ICD-10-CM | POA: Diagnosis not present

## 2013-07-29 DIAGNOSIS — E785 Hyperlipidemia, unspecified: Secondary | ICD-10-CM | POA: Diagnosis not present

## 2013-07-31 ENCOUNTER — Encounter (HOSPITAL_COMMUNITY)
Admission: RE | Admit: 2013-07-31 | Discharge: 2013-07-31 | Disposition: A | Discharge status: Home / Self Care | Payer: Medicare Other | Source: Ambulatory Visit | Attending: Cardiovascular Disease | Admitting: Cardiovascular Disease

## 2013-08-03 ENCOUNTER — Encounter (HOSPITAL_COMMUNITY)
Admission: RE | Admit: 2013-08-03 | Discharge: 2013-08-03 | Disposition: A | Discharge status: Home / Self Care | Payer: Medicare Other | Source: Ambulatory Visit | Attending: Cardiovascular Disease | Admitting: Cardiovascular Disease

## 2013-08-03 DIAGNOSIS — I2510 Atherosclerotic heart disease of native coronary artery without angina pectoris: Secondary | ICD-10-CM | POA: Diagnosis not present

## 2013-08-03 DIAGNOSIS — Z87891 Personal history of nicotine dependence: Secondary | ICD-10-CM | POA: Diagnosis not present

## 2013-08-03 DIAGNOSIS — Z79899 Other long term (current) drug therapy: Secondary | ICD-10-CM | POA: Insufficient documentation

## 2013-08-03 DIAGNOSIS — Z9861 Coronary angioplasty status: Secondary | ICD-10-CM | POA: Insufficient documentation

## 2013-08-03 DIAGNOSIS — E783 Hyperchylomicronemia: Secondary | ICD-10-CM | POA: Insufficient documentation

## 2013-08-03 DIAGNOSIS — Z7982 Long term (current) use of aspirin: Secondary | ICD-10-CM | POA: Insufficient documentation

## 2013-08-03 DIAGNOSIS — Z7902 Long term (current) use of antithrombotics/antiplatelets: Secondary | ICD-10-CM | POA: Diagnosis not present

## 2013-08-03 DIAGNOSIS — I498 Other specified cardiac arrhythmias: Secondary | ICD-10-CM | POA: Insufficient documentation

## 2013-08-03 DIAGNOSIS — Z5189 Encounter for other specified aftercare: Secondary | ICD-10-CM | POA: Diagnosis not present

## 2013-08-03 DIAGNOSIS — I252 Old myocardial infarction: Secondary | ICD-10-CM | POA: Diagnosis not present

## 2013-08-03 DIAGNOSIS — E785 Hyperlipidemia, unspecified: Secondary | ICD-10-CM | POA: Insufficient documentation

## 2013-08-05 ENCOUNTER — Encounter (HOSPITAL_COMMUNITY)
Admission: RE | Admit: 2013-08-05 | Discharge: 2013-08-05 | Disposition: A | Discharge status: Home / Self Care | Payer: Medicare Other | Source: Ambulatory Visit | Attending: Cardiovascular Disease | Admitting: Cardiovascular Disease

## 2013-08-07 ENCOUNTER — Encounter (HOSPITAL_COMMUNITY)
Admission: RE | Admit: 2013-08-07 | Discharge: 2013-08-07 | Disposition: A | Discharge status: Home / Self Care | Payer: Medicare Other | Source: Ambulatory Visit | Attending: Cardiovascular Disease | Admitting: Cardiovascular Disease

## 2013-08-10 ENCOUNTER — Encounter (HOSPITAL_COMMUNITY)
Admission: RE | Admit: 2013-08-10 | Discharge: 2013-08-10 | Disposition: A | Discharge status: Home / Self Care | Payer: Medicare Other | Source: Ambulatory Visit | Attending: Cardiovascular Disease | Admitting: Cardiovascular Disease

## 2013-08-12 ENCOUNTER — Encounter (HOSPITAL_COMMUNITY)
Admission: RE | Admit: 2013-08-12 | Discharge: 2013-08-12 | Disposition: A | Discharge status: Home / Self Care | Payer: Medicare Other | Source: Ambulatory Visit | Attending: Cardiovascular Disease | Admitting: Cardiovascular Disease

## 2013-08-13 DIAGNOSIS — H3530 Unspecified macular degeneration: Secondary | ICD-10-CM | POA: Diagnosis not present

## 2013-08-13 DIAGNOSIS — Z961 Presence of intraocular lens: Secondary | ICD-10-CM | POA: Diagnosis not present

## 2013-08-13 DIAGNOSIS — H02409 Unspecified ptosis of unspecified eyelid: Secondary | ICD-10-CM | POA: Diagnosis not present

## 2013-08-13 DIAGNOSIS — H52209 Unspecified astigmatism, unspecified eye: Secondary | ICD-10-CM | POA: Diagnosis not present

## 2013-08-14 ENCOUNTER — Encounter (HOSPITAL_COMMUNITY)
Admission: RE | Admit: 2013-08-14 | Discharge: 2013-08-14 | Disposition: A | Discharge status: Home / Self Care | Payer: Medicare Other | Source: Ambulatory Visit | Attending: Cardiovascular Disease | Admitting: Cardiovascular Disease

## 2013-08-17 ENCOUNTER — Encounter (HOSPITAL_COMMUNITY)
Admission: RE | Admit: 2013-08-17 | Discharge: 2013-08-17 | Disposition: A | Discharge status: Home / Self Care | Payer: Medicare Other | Source: Ambulatory Visit | Attending: Cardiovascular Disease | Admitting: Cardiovascular Disease

## 2013-08-17 DIAGNOSIS — B353 Tinea pedis: Secondary | ICD-10-CM | POA: Diagnosis not present

## 2013-08-18 DIAGNOSIS — M47817 Spondylosis without myelopathy or radiculopathy, lumbosacral region: Secondary | ICD-10-CM | POA: Diagnosis not present

## 2013-08-19 ENCOUNTER — Encounter (HOSPITAL_COMMUNITY)
Admission: RE | Admit: 2013-08-19 | Discharge: 2013-08-19 | Disposition: A | Discharge status: Home / Self Care | Payer: Medicare Other | Source: Ambulatory Visit | Attending: Cardiovascular Disease | Admitting: Cardiovascular Disease

## 2013-08-21 ENCOUNTER — Encounter (HOSPITAL_COMMUNITY)
Admission: RE | Admit: 2013-08-21 | Discharge: 2013-08-21 | Disposition: A | Discharge status: Home / Self Care | Payer: Medicare Other | Source: Ambulatory Visit | Attending: Cardiovascular Disease | Admitting: Cardiovascular Disease

## 2013-08-24 ENCOUNTER — Encounter (HOSPITAL_COMMUNITY)
Admission: RE | Admit: 2013-08-24 | Discharge: 2013-08-24 | Disposition: A | Discharge status: Home / Self Care | Payer: Medicare Other | Source: Ambulatory Visit | Attending: Cardiovascular Disease | Admitting: Cardiovascular Disease

## 2013-08-26 ENCOUNTER — Encounter (HOSPITAL_COMMUNITY)
Admission: RE | Admit: 2013-08-26 | Discharge: 2013-08-26 | Disposition: A | Discharge status: Home / Self Care | Payer: Medicare Other | Source: Ambulatory Visit | Attending: Cardiovascular Disease | Admitting: Cardiovascular Disease

## 2013-08-28 ENCOUNTER — Encounter (HOSPITAL_COMMUNITY)
Admission: RE | Admit: 2013-08-28 | Discharge: 2013-08-28 | Disposition: A | Discharge status: Home / Self Care | Payer: Medicare Other | Source: Ambulatory Visit | Attending: Cardiovascular Disease | Admitting: Cardiovascular Disease

## 2013-08-31 ENCOUNTER — Encounter (HOSPITAL_COMMUNITY)
Admission: RE | Admit: 2013-08-31 | Discharge: 2013-08-31 | Disposition: A | Discharge status: Home / Self Care | Payer: Medicare Other | Source: Ambulatory Visit | Attending: Cardiovascular Disease | Admitting: Cardiovascular Disease

## 2013-09-02 ENCOUNTER — Encounter (HOSPITAL_COMMUNITY)
Admission: RE | Admit: 2013-09-02 | Discharge: 2013-09-02 | Disposition: A | Discharge status: Home / Self Care | Payer: Medicare Other | Source: Ambulatory Visit | Attending: Cardiovascular Disease | Admitting: Cardiovascular Disease

## 2013-09-02 DIAGNOSIS — I2510 Atherosclerotic heart disease of native coronary artery without angina pectoris: Secondary | ICD-10-CM | POA: Diagnosis not present

## 2013-09-02 DIAGNOSIS — Z7902 Long term (current) use of antithrombotics/antiplatelets: Secondary | ICD-10-CM | POA: Diagnosis not present

## 2013-09-02 DIAGNOSIS — E783 Hyperchylomicronemia: Secondary | ICD-10-CM | POA: Diagnosis not present

## 2013-09-02 DIAGNOSIS — E785 Hyperlipidemia, unspecified: Secondary | ICD-10-CM | POA: Diagnosis not present

## 2013-09-02 DIAGNOSIS — I498 Other specified cardiac arrhythmias: Secondary | ICD-10-CM | POA: Insufficient documentation

## 2013-09-02 DIAGNOSIS — Z5189 Encounter for other specified aftercare: Secondary | ICD-10-CM | POA: Insufficient documentation

## 2013-09-02 DIAGNOSIS — Z7982 Long term (current) use of aspirin: Secondary | ICD-10-CM | POA: Diagnosis not present

## 2013-09-02 DIAGNOSIS — Z9861 Coronary angioplasty status: Secondary | ICD-10-CM | POA: Diagnosis not present

## 2013-09-02 DIAGNOSIS — Z87891 Personal history of nicotine dependence: Secondary | ICD-10-CM | POA: Insufficient documentation

## 2013-09-02 DIAGNOSIS — I252 Old myocardial infarction: Secondary | ICD-10-CM | POA: Diagnosis not present

## 2013-09-02 DIAGNOSIS — Z79899 Other long term (current) drug therapy: Secondary | ICD-10-CM | POA: Insufficient documentation

## 2013-09-04 ENCOUNTER — Encounter (HOSPITAL_COMMUNITY)
Admission: RE | Admit: 2013-09-04 | Discharge: 2013-09-04 | Disposition: A | Discharge status: Home / Self Care | Payer: Medicare Other | Source: Ambulatory Visit | Attending: Cardiovascular Disease | Admitting: Cardiovascular Disease

## 2013-09-07 ENCOUNTER — Encounter (HOSPITAL_COMMUNITY)
Admission: RE | Admit: 2013-09-07 | Discharge: 2013-09-07 | Disposition: A | Discharge status: Home / Self Care | Payer: Medicare Other | Source: Ambulatory Visit | Attending: Cardiovascular Disease | Admitting: Cardiovascular Disease

## 2013-09-09 ENCOUNTER — Encounter (HOSPITAL_COMMUNITY)
Admission: RE | Admit: 2013-09-09 | Discharge: 2013-09-09 | Disposition: A | Discharge status: Home / Self Care | Payer: Medicare Other | Source: Ambulatory Visit | Attending: Cardiovascular Disease | Admitting: Cardiovascular Disease

## 2013-09-11 ENCOUNTER — Encounter (HOSPITAL_COMMUNITY): Payer: Medicare Other

## 2013-09-14 ENCOUNTER — Encounter (HOSPITAL_COMMUNITY)
Admission: RE | Admit: 2013-09-14 | Discharge: 2013-09-14 | Disposition: A | Discharge status: Home / Self Care | Payer: Medicare Other | Source: Ambulatory Visit | Attending: Cardiovascular Disease | Admitting: Cardiovascular Disease

## 2013-09-14 NOTE — Progress Notes (Signed)
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

## 2013-09-16 ENCOUNTER — Encounter (HOSPITAL_COMMUNITY): Payer: Medicare Other

## 2013-09-18 ENCOUNTER — Encounter (HOSPITAL_COMMUNITY): Payer: Medicare Other

## 2013-09-21 ENCOUNTER — Encounter (HOSPITAL_COMMUNITY): Payer: Medicare Other

## 2013-09-23 ENCOUNTER — Encounter (HOSPITAL_COMMUNITY): Payer: Medicare Other

## 2013-09-25 ENCOUNTER — Encounter (HOSPITAL_COMMUNITY): Payer: Medicare Other

## 2013-10-06 DIAGNOSIS — L821 Other seborrheic keratosis: Secondary | ICD-10-CM | POA: Diagnosis not present

## 2013-10-06 DIAGNOSIS — L570 Actinic keratosis: Secondary | ICD-10-CM | POA: Diagnosis not present

## 2013-10-06 DIAGNOSIS — D1801 Hemangioma of skin and subcutaneous tissue: Secondary | ICD-10-CM | POA: Diagnosis not present

## 2013-10-06 DIAGNOSIS — Z85828 Personal history of other malignant neoplasm of skin: Secondary | ICD-10-CM | POA: Diagnosis not present

## 2013-10-07 DIAGNOSIS — R609 Edema, unspecified: Secondary | ICD-10-CM | POA: Diagnosis not present

## 2013-10-14 DIAGNOSIS — J209 Acute bronchitis, unspecified: Secondary | ICD-10-CM | POA: Diagnosis not present

## 2013-10-14 DIAGNOSIS — R609 Edema, unspecified: Secondary | ICD-10-CM | POA: Diagnosis not present

## 2013-10-29 DIAGNOSIS — I509 Heart failure, unspecified: Secondary | ICD-10-CM | POA: Diagnosis not present

## 2013-10-29 DIAGNOSIS — R21 Rash and other nonspecific skin eruption: Secondary | ICD-10-CM | POA: Diagnosis not present

## 2013-10-29 DIAGNOSIS — G20 Parkinson's disease: Secondary | ICD-10-CM | POA: Diagnosis not present

## 2013-11-03 ENCOUNTER — Encounter: Payer: Self-pay | Admitting: Neurology

## 2013-11-03 DIAGNOSIS — H43819 Vitreous degeneration, unspecified eye: Secondary | ICD-10-CM | POA: Diagnosis not present

## 2013-11-03 DIAGNOSIS — H353290 Exudative age-related macular degeneration, unspecified eye, stage unspecified: Secondary | ICD-10-CM | POA: Diagnosis not present

## 2013-11-03 DIAGNOSIS — H353190 Nonexudative age-related macular degeneration, unspecified eye, stage unspecified: Secondary | ICD-10-CM | POA: Diagnosis not present

## 2013-11-05 ENCOUNTER — Encounter: Payer: Self-pay | Admitting: Neurology

## 2013-11-05 ENCOUNTER — Encounter (INDEPENDENT_AMBULATORY_CARE_PROVIDER_SITE_OTHER): Payer: Self-pay

## 2013-11-05 ENCOUNTER — Ambulatory Visit (INDEPENDENT_AMBULATORY_CARE_PROVIDER_SITE_OTHER): Payer: Medicare Other | Admitting: Neurology

## 2013-11-05 VITALS — BP 118/67 | HR 62 | Temp 97.7°F | Ht 66.0 in | Wt 169.0 lb

## 2013-11-05 DIAGNOSIS — I2510 Atherosclerotic heart disease of native coronary artery without angina pectoris: Secondary | ICD-10-CM | POA: Diagnosis not present

## 2013-11-05 DIAGNOSIS — R269 Unspecified abnormalities of gait and mobility: Secondary | ICD-10-CM

## 2013-11-05 DIAGNOSIS — G609 Hereditary and idiopathic neuropathy, unspecified: Secondary | ICD-10-CM | POA: Diagnosis not present

## 2013-11-05 DIAGNOSIS — D518 Other vitamin B12 deficiency anemias: Secondary | ICD-10-CM | POA: Diagnosis not present

## 2013-11-05 DIAGNOSIS — G629 Polyneuropathy, unspecified: Secondary | ICD-10-CM

## 2013-11-05 HISTORY — DX: Unspecified abnormalities of gait and mobility: R26.9

## 2013-11-05 NOTE — Progress Notes (Signed)
Reason for visit: Gait disorder  Corey Bruntz. is a 80 y.o. male  History of present illness:  Corey Larson is an 80 year old right-handed white male with a history of coronary artery disease and a prior TIA event in Corey past associated with confusion. He indicates that there has been some change in his ability to ambulate over Corey last 6 months, but his wife indicates that Corey change occurred or 5 weeks ago. Corey wife indicates that Corey change was relatively sudden in nature, and Corey Larson has been noted to have some problems with shuffling his feet, and he has had an occasional fall. He has also been noted to have some depression affect, some daytime drowsiness. He is slurring his speech. Corey Larson has had chronic insomnia, and he has been on Librium for at least 2 or 3 years, but recently, clonazepam has been added. When he takes clonazepam, he may be confused at night, wandering Corey halls. Corey Larson has had some decline in memory and concentration recently as well. Corey Larson has a history of a peripheral neuropathy with numbness in Corey feet across Corey ankles. He has some burning and stinging in Corey feet at night. Corey Larson has not had any tremors. Corey Larson has been noted to have some issues with swallowing, and coughing after eating. He does have a lot of problems with neck discomfort, and he has had recent significant increased stiffness of Corey neck. He is sent to this office for further evaluation. He denies problems controlling Corey bowels or Corey bladder.  Past Medical History  Diagnosis Date  . CAD (coronary artery disease) 2005    stent to LAD by Dr. Glade Lloyd. mild irregulatities of RCA.    . OSA (obstructive sleep apnea)     mild, never on cpap  . Hyperlipidemia LDL goal < 70   . H/O exercise stress test 05/2012    no statistically significant ischemia.   . Peripheral neuropathy   . Macular degeneration     bilateral  . Hypertension   . CRF (chronic renal failure)    . Insomnia   . ED (erectile dysfunction)   . Vitamin D deficiency   . Abnormality of gait 11/05/2013  . History of TIA (transient ischemic attack)     transient confusion  . Macular degeneration     Right>left  . HOH (hard of hearing)     hearing aids    Past Surgical History  Procedure Laterality Date  . Cardiac catheterization      2005, stent placed DES LAD  . Knee surgery      right knee 2011   . Ankle surgery      right ankle 2002 fracture  . Coronary angioplasty    . Back surgery  1976    laminectomy  . Hernia repair  1981    "double hernia"  . Cataract extraction      bilateral    Family History  Problem Relation Age of Onset  . Diabetes Sister     Social history:  reports that he quit smoking about 30 years ago. His smoking use included Cigarettes and Pipe. He smoked 0.00 packs per day for 30 years. He has never used smokeless tobacco. He reports that he drinks alcohol. He reports that he does not use illicit drugs.  Medications:  Current Outpatient Prescriptions on File Prior to Visit  Medication Sig Dispense Refill  . B Complex-C (B-COMPLEX WITH VITAMIN C) tablet Take 1  tablet by mouth daily.      . chlordiazePOXIDE (LIBRIUM) 25 MG capsule Take 25 mg by mouth at bedtime.      . cholecalciferol (VITAMIN D) 1000 UNITS tablet Take 1,000 Units by mouth daily.      . clopidogrel (PLAVIX) 75 MG tablet Take 1 tablet (75 mg total) by mouth daily with breakfast.  30 tablet  11  . gabapentin (NEURONTIN) 800 MG tablet Take 800 mg by mouth 3 (three) times daily.      . nebivolol (BYSTOLIC) 10 MG tablet Take 10 mg by mouth. Takes 1/2 tablet      . nitroGLYCERIN (NITROSTAT) 0.4 MG SL tablet Place 1 tablet (0.4 mg total) under Corey tongue every 5 (five) minutes x 3 doses as needed for chest pain.  25 tablet  12  . simvastatin (ZOCOR) 40 MG tablet Take 20 mg by mouth at bedtime.       No current facility-administered medications on file prior to visit.     No Known  Allergies  ROS:  Out of a complete 14 system review of symptoms, Corey Larson complains only of Corey following symptoms, and all other reviewed systems are negative.  Fatigue Hearing loss, hearing aids Itching of Corey feet Blurred vision, loss of vision Snoring Easy bruising, easy bleeding Memory loss, confusion, headache, numbness, weakness, slurred speech Too much sleep, history of insomnia, decreased energy, disinterest in activities Snoring  Blood pressure 118/67, pulse 62, temperature 97.7 F (36.5 C), temperature source Oral, height 5\' 6"  (1.676 m), weight 169 lb (76.658 kg).  Physical Exam  General: Corey Larson is alert and cooperative at Corey time of Corey examination.  Eyes: Pupils are equal, round, and reactive to light. Discs are flat bilaterally.  Neck: Corey neck is supple, no carotid bruits are noted.  Respiratory: Corey respiratory examination is clear.  Cardiovascular: Corey cardiovascular examination reveals a regular rate and rhythm, no obvious murmurs or rubs are noted.  Neuromuscular: Corey Larson has severe limitation of movement of Corey head and neck with flexion and extension, and rotational movement.  Skin: Extremities are without significant edema.  Neurologic Exam  Mental status: Corey Mini-Mental status examination done today shows a total score of 22/30.  Cranial nerves: Facial symmetry is present. There is good sensation of Corey face to pinprick and soft touch bilaterally. Corey strength of Corey facial muscles and Corey muscles to head turning and shoulder shrug are normal bilaterally. Speech is well enunciated, no aphasia or dysarthria is noted. Extraocular movements are full. Visual fields are full. Corey tongue is midline, and Corey Larson has symmetric elevation of Corey soft palate. No obvious hearing deficits are noted.  Motor: Corey motor testing reveals 5 over 5 strength of all 4 extremities, with exception that there may be some 4/5 proximal strength in Corey legs  bilaterally. Good symmetric motor tone is noted throughout.  Sensory: Sensory testing is intact to pinprick, soft touch, vibration sensation, and position sense on all 4 extremities, with Corey exception that there is a decrease in position sense of Corey left foot. No evidence of extinction is noted.  Coordination: Cerebellar testing reveals good finger-nose-finger and heel-to-shin bilaterally.  Gait and station: Gait is shuffling in nature, good arm swing is seen. Corey Larson stumbles with walking. Tandem gait is poor. Romberg is negative, but is unsteady. No drift is seen.  Reflexes: Deep tendon reflexes are symmetric, but are depressed bilaterally. Ankle jerk reflexes are absent bilaterally. Toes are downgoing bilaterally.   Assessment/Plan:  1. Gait disorder  2. Memory disorder   Corey Larson has had a recent change in mental status and gait. Corey Larson has a history of coronary artery disease and cerebrovascular disease. MRI of Corey brain will be done to evaluate for cerebrovascular disease, and a possible stroke event. Corey Larson does not appear to have definite features of Parkinson's disease. It is possible that some of Corey issues may be medication related. Corey Larson recently has been placed on clonazepam which is a long-acting benzodiazepine. This in combination with underlying chronic renal insufficiency may result in a significant increase in blood levels of this medication and its metabolites that may result in confusion, and gait instability. Clonazepam should be discontinued. Corey Larson will be set up for physical therapy for gait training. Blood work will be done today. Corey Larson will followup in 3 months.  Jill Alexanders MD 11/05/2013 5:47 PM  [facility] [address] [place], [place] [address]  Phone [phone] Fax [phone]

## 2013-11-05 NOTE — Patient Instructions (Signed)

## 2013-11-07 LAB — COPPER, SERUM: COPPER: 132 ug/dL (ref 72–166)

## 2013-11-07 LAB — VITAMIN B12: VITAMIN B 12: 264 pg/mL (ref 211–946)

## 2013-11-07 LAB — RPR: RPR: NONREACTIVE

## 2013-11-07 LAB — SEDIMENTATION RATE: Sed Rate: 8 mm/h (ref 0–30)

## 2013-11-08 ENCOUNTER — Encounter: Payer: Self-pay | Admitting: Neurology

## 2013-11-12 ENCOUNTER — Ambulatory Visit
Admission: RE | Admit: 2013-11-12 | Discharge: 2013-11-12 | Disposition: A | Discharge status: Home / Self Care | Payer: Medicare Other | Source: Ambulatory Visit | Attending: Neurology | Admitting: Neurology

## 2013-11-12 DIAGNOSIS — R269 Unspecified abnormalities of gait and mobility: Secondary | ICD-10-CM

## 2013-11-12 DIAGNOSIS — G609 Hereditary and idiopathic neuropathy, unspecified: Secondary | ICD-10-CM | POA: Diagnosis not present

## 2013-11-12 DIAGNOSIS — G629 Polyneuropathy, unspecified: Secondary | ICD-10-CM

## 2013-11-16 ENCOUNTER — Telehealth: Payer: Self-pay | Admitting: Neurology

## 2013-11-16 ENCOUNTER — Ambulatory Visit: Payer: Medicare Other | Attending: Neurology | Admitting: Physical Therapy

## 2013-11-16 DIAGNOSIS — IMO0001 Reserved for inherently not codable concepts without codable children: Secondary | ICD-10-CM | POA: Insufficient documentation

## 2013-11-16 DIAGNOSIS — Z9181 History of falling: Secondary | ICD-10-CM | POA: Insufficient documentation

## 2013-11-16 DIAGNOSIS — R269 Unspecified abnormalities of gait and mobility: Secondary | ICD-10-CM | POA: Diagnosis not present

## 2013-11-16 NOTE — Telephone Encounter (Signed)
I called the patient, I discussed the MRI results again with him. The patient is in physical therapy for the walking. I have recommended that he be tapered off of the clonazepam. MRI the brain essentially looks normal for age, very minimal white matter changes seen.

## 2013-11-16 NOTE — Telephone Encounter (Signed)
Patient wife requesting a call back re: MRI results.

## 2013-11-16 NOTE — Telephone Encounter (Signed)
Returning Corey Larson's call

## 2013-11-16 NOTE — Telephone Encounter (Signed)
  I called the patient. The MRI the brain does not show extensive white matter changes, very minimal white matter changes seen. No explanation for walking problems or memory issues.   MRI brain 11/15/2013:  Impression   Abnormal MRI scan of the brain showing mild changes of  chronic microvascular ischemia and generalized cerebral atrophy.

## 2013-11-24 ENCOUNTER — Ambulatory Visit: Payer: Medicare Other | Admitting: Physical Therapy

## 2013-11-24 ENCOUNTER — Other Ambulatory Visit: Payer: Self-pay | Admitting: Cardiovascular Disease

## 2013-11-24 DIAGNOSIS — Z9181 History of falling: Secondary | ICD-10-CM | POA: Diagnosis not present

## 2013-11-24 DIAGNOSIS — IMO0001 Reserved for inherently not codable concepts without codable children: Secondary | ICD-10-CM | POA: Diagnosis not present

## 2013-11-24 DIAGNOSIS — R269 Unspecified abnormalities of gait and mobility: Secondary | ICD-10-CM | POA: Diagnosis not present

## 2013-11-24 NOTE — Telephone Encounter (Signed)
Rx refill sent to patient pharmacy   

## 2013-11-30 ENCOUNTER — Ambulatory Visit: Payer: Medicare Other | Admitting: Physical Therapy

## 2013-11-30 DIAGNOSIS — Z9181 History of falling: Secondary | ICD-10-CM | POA: Diagnosis not present

## 2013-11-30 DIAGNOSIS — R269 Unspecified abnormalities of gait and mobility: Secondary | ICD-10-CM | POA: Diagnosis not present

## 2013-11-30 DIAGNOSIS — IMO0001 Reserved for inherently not codable concepts without codable children: Secondary | ICD-10-CM | POA: Diagnosis not present

## 2013-12-02 ENCOUNTER — Ambulatory Visit (INDEPENDENT_AMBULATORY_CARE_PROVIDER_SITE_OTHER): Payer: Medicare Other | Admitting: Cardiovascular Disease

## 2013-12-02 ENCOUNTER — Encounter: Payer: Self-pay | Admitting: Cardiovascular Disease

## 2013-12-02 VITALS — BP 108/60 | HR 64 | Ht 66.0 in | Wt 173.6 lb

## 2013-12-02 DIAGNOSIS — N529 Male erectile dysfunction, unspecified: Secondary | ICD-10-CM | POA: Diagnosis not present

## 2013-12-02 DIAGNOSIS — G4733 Obstructive sleep apnea (adult) (pediatric): Secondary | ICD-10-CM | POA: Diagnosis not present

## 2013-12-02 DIAGNOSIS — I2510 Atherosclerotic heart disease of native coronary artery without angina pectoris: Secondary | ICD-10-CM

## 2013-12-02 DIAGNOSIS — E785 Hyperlipidemia, unspecified: Secondary | ICD-10-CM | POA: Diagnosis not present

## 2013-12-02 NOTE — Patient Instructions (Signed)
Your physician recommends that you schedule a follow-up appointment in: 6 months. No changes were made today in your therapy. 

## 2013-12-02 NOTE — Progress Notes (Signed)
Patient ID: Corey Larson., male   DOB: 11-29-1927, 80 y.o.   MRN: [id number]     HPI: Corey Mikami. is a 80 y.o. male who presents to the office follow-up cardiology evaluation.  Corey Larson has a history of  coronary artery disease and underwent stenting of a 95% focal LAD stenosis by Dr. Glade Lloyd in 2005. At that time he did have mild irregularities of his RCA. He has a history of mild obstructive sleep apnea due to his cardiovascular comorbidities was referred for CPAP titration but he never did get followup with getting a unit. He does have a history of hyperlipidemia, as well as some erectile dysfunction.  Apparently on April 15, 2013 he had been cutting the grass and noticed chest pressure. He ultimately presented to [facility] hospital and ruled in for a NSTEMI. Following day he underwent cardiac catheterization by Dr. Ellyn Hack and was found to have a widely patent LAD stent. There were minimal luminal irregularities in the LAD system. The circumflex vessel is normal. The RCA had an irregular 50-60% lesion the first bend of the vessel. Medical therapy was recommended. He was started on Plavix in addition to isosorbide mononitrate and saw [name] in followup in the office on 04/27/2013. He apparently had been scheduled for a nuclear stress test in December but this was not done.  Corey Larson participated in cardiac rehabilitation. He denies any further episodes of chest pressure. He has been taking Bystolic 5 mg, simvastatin 20 mg in addition to aspirin and Plavix for dual antiplatelet therapy.  He stopped taking isosorbide mononitrate to his wishes to take medication for erectile function.  For this reason, he was started on low-dose amlodipine 2.5 mg.  He also is a history of peripheral neuropathy and is on gabapentin.  He recently saw Dr. Jannifer Franklin for gait disturbance.  He also has some memory issues.  It was advised that he avoid taking his clonopin.  Presently, he denies chest pain.   He denies palpitations.  He denies presyncope or syncope.  He is taking sialoliths on an as-needed basis rather than Viagra.  He denies PND, orthopnea.  He's bothered by his peripheral neuropathy.   Past Medical History  Diagnosis Date  . CAD (coronary artery disease) 2005    stent to LAD by Dr. Glade Lloyd. mild irregulatities of RCA.    . OSA (obstructive sleep apnea)     mild, never on cpap  . Hyperlipidemia LDL goal < 70   . H/O exercise stress test 05/2012    no statistically significant ischemia.   . Peripheral neuropathy   . Macular degeneration     bilateral  . Hypertension   . CRF (chronic renal failure)   . Insomnia   . ED (erectile dysfunction)   . Vitamin D deficiency   . Abnormality of gait 11/05/2013  . History of TIA (transient ischemic attack)     transient confusion  . Macular degeneration     Right>left  . HOH (hard of hearing)     hearing aids    Past Surgical History  Procedure Laterality Date  . Cardiac catheterization      2005, stent placed DES LAD  . Knee surgery      right knee 2011   . Ankle surgery      right ankle 2002 fracture  . Coronary angioplasty    . Back surgery  1976    laminectomy  . Hernia repair  1981    "double  hernia"  . Cataract extraction      bilateral    No Known Allergies  Current Outpatient Prescriptions  Medication Sig Dispense Refill  . amLODipine (NORVASC) 2.5 MG tablet Take 1 tablet by mouth daily.      [name] aspirin 81 MG tablet Take 81 mg by mouth daily.      . B Complex-C (B-COMPLEX WITH VITAMIN C) tablet Take 1 tablet by mouth daily.      . chlordiazePOXIDE (LIBRIUM) 25 MG capsule Take 25 mg by mouth at bedtime.      . cholecalciferol (VITAMIN D) 1000 UNITS tablet Take 1,000 Units by mouth daily.      . clonazePAM (KLONOPIN) 0.5 MG tablet Take 1-2 tablets by mouth 3 times/day as needed-between meals & bedtime.      . clopidogrel (PLAVIX) 75 MG tablet Take 1 tablet (75 mg total) by mouth daily with breakfast.  30  tablet  11  . furosemide (LASIX) 20 MG tablet Take 1 tablet by mouth as needed.      . gabapentin (NEURONTIN) 800 MG tablet Take 800 mg by mouth 3 (three) times daily.      . nebivolol (BYSTOLIC) 10 MG tablet Takes 1/2 tablet      . nitroGLYCERIN (NITROSTAT) 0.4 MG SL tablet Place 1 tablet (0.4 mg total) under the tongue every 5 (five) minutes x 3 doses as needed for chest pain.  25 tablet  12  . simvastatin (ZOCOR) 40 MG tablet Take 20 mg by mouth at bedtime.      . tadalafil (CIALIS) 20 MG tablet Take 20 mg by mouth daily as needed for erectile dysfunction.       No current facility-administered medications for this visit.    History   Social History  . Marital Status: Married    Spouse Name: N/A    Number of Children: N/A  . Years of Education: N/A   Occupational History  . Not on file.   Social History Main Topics  . Smoking status: Former Smoker -- 30 years    Types: Cigarettes, Pipe    Quit date: 04/16/1983  . Smokeless tobacco: Never Used  . Alcohol Use: Yes     Comment: rarely  . Drug Use: No  . Sexual Activity: Not on file   Other Topics Concern  . Not on file   Social History Narrative  . No narrative on file    Family History  Problem Relation Age of Onset  . Diabetes Sister    ROS General: Negative; No fevers, chills, or night sweats;  HEENT: Positive for reduced hearing for which he uses hearing aids; No changes in vision, sinus congestion, difficulty swallowing Pulmonary: Negative; No cough, wheezing, shortness of breath, hemoptysis Cardiovascular: Negative; No chest pain, presyncope, syncope, palpatations GI: Negative; No nausea, vomiting, diarrhea, or abdominal pain GU: Positive for erectile dysfunction No dysuria, hematuria, or difficulty voiding Musculoskeletal: Negative; no myalgias, joint pain, or weakness Hematologic/Oncology: Negative; no easy bruising, bleeding Endocrine: Negative; no heat/cold intolerance; no diabetes Neuro: Positive for  peripheral neuropathy, and gait disturbance headaches Skin: Negative; No rashes or skin lesions Psychiatric: Negative; No behavioral problems, depression Sleep: Positive for mild sleep apnea, but does not use CPAP; no daytime sleepiness, hypersomnolence, bruxism, restless legs, hypnogognic hallucinations, no cataplexy Other comprehensive 14 point system review is negative.   PE BP 108/60  Pulse 64  Ht 5\' 6"  (1.676 m)  Wt 173 lb 9.6 oz (78.744 kg)  BMI 28.03 kg/m2  General:  Alert, oriented, no distress.  Skin: normal turgor, no rashes HEENT: Normocephalic, atraumatic. Pupils round and reactive; sclera anicteric;no lid lag. Extraocular muscles intact;no xanthelasmas.  Hearing aids in place Nose without nasal septal hypertrophy Mouth/Parynx benign; Mallinpatti scale 3 Neck: No JVD, no carotid bruits; normal carotid upstroke Lungs: clear to ausculatation and percussion; no wheezing or rales Chest wall: no tenderness to palpitation Heart: RRR, s1 s2 normal; 1/6 systolic murmur; no diastolic murmur, rub thrills or heaves Abdomen: soft, nontender; no hepatosplenomehaly, BS+; abdominal aorta nontender and not dilated by palpation. Back: no CVA tenderness Pulses 2+ Extremities: no clubbing cyanosis or edema, Homan's sign negative  Neurologic: grossly nonfocal; cranial nerves grossly normal. Psychologic: normal affect and mood.  ECG (independently read by me): Sinus rhythm at 64 with sinus arrhythmia.  Nonspecific T changes.  QTc interval 437 ms.  Prior February 2015 ECG (independently read by me): Sinus rhythm at 72 beats per minute. No ectopy.  LABS:  BMET    Component Value Date/Time   NA 140 04/16/2013 0503   K 3.8 04/16/2013 0503   CL 106 04/16/2013 0503   CO2 24 04/16/2013 0503   GLUCOSE 93 04/16/2013 0503   BUN 22 04/16/2013 0503   CREATININE 1.25 04/16/2013 0503   CALCIUM 8.9 04/16/2013 0503   GFRNONAA 52* 04/16/2013 0503   GFRAA 60* 04/16/2013 0503     Hepatic  Function Panel     Component Value Date/Time   PROT 6.3 04/16/2013 0503   ALBUMIN 3.3* 04/16/2013 0503   AST 25 04/16/2013 0503   ALT 12 04/16/2013 0503   ALKPHOS 47 04/16/2013 0503   BILITOT 1.3* 04/16/2013 0503   BILIDIR 0.2 04/16/2013 0503   IBILI 1.1* 04/16/2013 0503     CBC    Component Value Date/Time   WBC 6.7 04/17/2013 0525   RBC 4.09* 04/17/2013 0525   HGB 11.5* 04/17/2013 0525   HCT 34.7* 04/17/2013 0525   PLT 142* 04/17/2013 0525   MCV 84.8 04/17/2013 0525   MCH 28.1 04/17/2013 0525   MCHC 33.1 04/17/2013 0525   RDW 15.9* 04/17/2013 0525     BNP No results found for this basename: probnp    Lipid Panel     Component Value Date/Time   CHOL 105 04/16/2013 0503   TRIG 65 04/16/2013 0503   HDL 42 04/16/2013 0503   CHOLHDL 2.5 04/16/2013 0503   VLDL 13 04/16/2013 0503   LDLCALC 50 04/16/2013 0503     RADIOLOGY: No results found.    ASSESSMENT AND PLAN: Corey Larson is an 80 year old gentleman who is 10 years status post PCI to a high-grade 95% focal LAD stenosis. He suffered a small non-ST segment elevation MI last November 2014 10 and the culprit lesion was felt to be 50-60% which was not significantly obstructive and at that time medical therapy was recommended.  He is now back on aspirin in addition to his Plavix therapy.  His blood pressure today is controlled on the systolic 5 mg, amlodipine 2.5 mg and he has taken Lasix 20 mg approximately 2-3 times per week as needed for leg swelling.  He is now taking cialis rather than Viagra for erectile dysfunction on the as-needed basis.  I reviewed the half-life data differences regarding cialis versus Viagra.  We discussed the contraindication to isosorbide use.  He tells me Dr. Alyson Ingles rechecking blood work in August.  OS that these be forwarded to me for my review.  Target LDL is less than 70.  He  does have mild sinus arrhythmia noted on ECG.  I will see him in 6 months for cardiology reevaluation or  sooner if problems arise.     Troy Sine, MD, [facility]  12/02/2013 10:36 AM

## 2013-12-08 ENCOUNTER — Ambulatory Visit: Payer: Medicare Other | Attending: Neurology | Admitting: Physical Therapy

## 2013-12-08 DIAGNOSIS — Z9181 History of falling: Secondary | ICD-10-CM | POA: Diagnosis not present

## 2013-12-08 DIAGNOSIS — IMO0001 Reserved for inherently not codable concepts without codable children: Secondary | ICD-10-CM | POA: Diagnosis not present

## 2013-12-08 DIAGNOSIS — R269 Unspecified abnormalities of gait and mobility: Secondary | ICD-10-CM | POA: Diagnosis not present

## 2013-12-14 DIAGNOSIS — N489 Disorder of penis, unspecified: Secondary | ICD-10-CM | POA: Diagnosis not present

## 2013-12-14 DIAGNOSIS — I509 Heart failure, unspecified: Secondary | ICD-10-CM | POA: Diagnosis not present

## 2013-12-15 ENCOUNTER — Ambulatory Visit: Payer: Medicare Other | Admitting: Physical Therapy

## 2013-12-15 DIAGNOSIS — R269 Unspecified abnormalities of gait and mobility: Secondary | ICD-10-CM | POA: Diagnosis not present

## 2013-12-15 DIAGNOSIS — Z9181 History of falling: Secondary | ICD-10-CM | POA: Diagnosis not present

## 2013-12-15 DIAGNOSIS — IMO0001 Reserved for inherently not codable concepts without codable children: Secondary | ICD-10-CM | POA: Diagnosis not present

## 2014-01-20 DIAGNOSIS — I10 Essential (primary) hypertension: Secondary | ICD-10-CM | POA: Diagnosis not present

## 2014-01-20 DIAGNOSIS — E559 Vitamin D deficiency, unspecified: Secondary | ICD-10-CM | POA: Diagnosis not present

## 2014-01-20 DIAGNOSIS — M109 Gout, unspecified: Secondary | ICD-10-CM | POA: Diagnosis not present

## 2014-01-27 DIAGNOSIS — N183 Chronic kidney disease, stage 3 unspecified: Secondary | ICD-10-CM | POA: Diagnosis not present

## 2014-01-27 DIAGNOSIS — I10 Essential (primary) hypertension: Secondary | ICD-10-CM | POA: Diagnosis not present

## 2014-01-27 DIAGNOSIS — I2510 Atherosclerotic heart disease of native coronary artery without angina pectoris: Secondary | ICD-10-CM | POA: Diagnosis not present

## 2014-01-27 DIAGNOSIS — E785 Hyperlipidemia, unspecified: Secondary | ICD-10-CM | POA: Diagnosis not present

## 2014-02-05 ENCOUNTER — Ambulatory Visit: Payer: PRIVATE HEALTH INSURANCE | Admitting: Adult Health

## 2014-03-11 DIAGNOSIS — M1711 Unilateral primary osteoarthritis, right knee: Secondary | ICD-10-CM | POA: Diagnosis not present

## 2014-03-11 DIAGNOSIS — Z23 Encounter for immunization: Secondary | ICD-10-CM | POA: Diagnosis not present

## 2014-03-25 ENCOUNTER — Telehealth: Payer: Self-pay | Admitting: *Deleted

## 2014-03-25 NOTE — Telephone Encounter (Signed)
Faxed clearance for facet injection attention Estill Bamberg @ [facility]. [phone].

## 2014-04-13 DIAGNOSIS — M5137 Other intervertebral disc degeneration, lumbosacral region: Secondary | ICD-10-CM | POA: Diagnosis not present

## 2014-04-26 ENCOUNTER — Other Ambulatory Visit: Payer: Self-pay

## 2014-04-26 MED ORDER — NITROGLYCERIN 0.4 MG SL SUBL: 0.4000 mg | SUBLINGUAL_TABLET | SUBLINGUAL | Status: AC | PRN

## 2014-05-03 ENCOUNTER — Other Ambulatory Visit: Payer: Self-pay

## 2014-05-03 MED ORDER — CLOPIDOGREL BISULFATE 75 MG PO TABS
75.0000 mg | ORAL_TABLET | Freq: Every day | ORAL | Status: DC
Start: 2014-05-03 — End: 2015-05-09

## 2014-05-03 NOTE — Telephone Encounter (Signed)
Rx sent to pharmacy   

## 2014-05-04 DIAGNOSIS — H3531 Nonexudative age-related macular degeneration: Secondary | ICD-10-CM | POA: Diagnosis not present

## 2014-05-12 DIAGNOSIS — J069 Acute upper respiratory infection, unspecified: Secondary | ICD-10-CM | POA: Diagnosis not present

## 2014-05-13 ENCOUNTER — Encounter (HOSPITAL_COMMUNITY): Payer: Self-pay | Admitting: Cardiology

## 2014-06-03 DIAGNOSIS — G609 Hereditary and idiopathic neuropathy, unspecified: Secondary | ICD-10-CM | POA: Diagnosis not present

## 2014-06-03 DIAGNOSIS — G4700 Insomnia, unspecified: Secondary | ICD-10-CM | POA: Diagnosis not present

## 2014-06-03 DIAGNOSIS — E785 Hyperlipidemia, unspecified: Secondary | ICD-10-CM | POA: Diagnosis not present

## 2014-07-22 DIAGNOSIS — Z0000 Encounter for general adult medical examination without abnormal findings: Secondary | ICD-10-CM | POA: Diagnosis not present

## 2014-07-22 DIAGNOSIS — E785 Hyperlipidemia, unspecified: Secondary | ICD-10-CM | POA: Diagnosis not present

## 2014-07-22 DIAGNOSIS — Z23 Encounter for immunization: Secondary | ICD-10-CM | POA: Diagnosis not present

## 2014-07-22 DIAGNOSIS — M109 Gout, unspecified: Secondary | ICD-10-CM | POA: Diagnosis not present

## 2014-07-22 DIAGNOSIS — I10 Essential (primary) hypertension: Secondary | ICD-10-CM | POA: Diagnosis not present

## 2014-07-22 DIAGNOSIS — E559 Vitamin D deficiency, unspecified: Secondary | ICD-10-CM | POA: Diagnosis not present

## 2014-07-22 DIAGNOSIS — Z125 Encounter for screening for malignant neoplasm of prostate: Secondary | ICD-10-CM | POA: Diagnosis not present

## 2014-07-22 DIAGNOSIS — Z1389 Encounter for screening for other disorder: Secondary | ICD-10-CM | POA: Diagnosis not present

## 2014-08-03 DIAGNOSIS — I10 Essential (primary) hypertension: Secondary | ICD-10-CM | POA: Diagnosis not present

## 2014-08-03 DIAGNOSIS — I2510 Atherosclerotic heart disease of native coronary artery without angina pectoris: Secondary | ICD-10-CM | POA: Diagnosis not present

## 2014-08-03 DIAGNOSIS — I5032 Chronic diastolic (congestive) heart failure: Secondary | ICD-10-CM | POA: Diagnosis not present

## 2014-08-03 DIAGNOSIS — E785 Hyperlipidemia, unspecified: Secondary | ICD-10-CM | POA: Diagnosis not present

## 2014-08-07 ENCOUNTER — Other Ambulatory Visit: Payer: Self-pay

## 2014-08-07 ENCOUNTER — Encounter (HOSPITAL_COMMUNITY): Payer: Self-pay | Admitting: Nurse Practitioner

## 2014-08-07 ENCOUNTER — Inpatient Hospital Stay (HOSPITAL_COMMUNITY)
Admission: EM | Admit: 2014-08-07 | Discharge: 2014-08-09 | DRG: 195 | Disposition: A | Discharge status: Home Health | Payer: Medicare Other | Attending: Internal Medicine | Admitting: Internal Medicine

## 2014-08-07 ENCOUNTER — Emergency Department (HOSPITAL_COMMUNITY): Payer: Medicare Other

## 2014-08-07 DIAGNOSIS — G4733 Obstructive sleep apnea (adult) (pediatric): Secondary | ICD-10-CM | POA: Diagnosis present

## 2014-08-07 DIAGNOSIS — Z833 Family history of diabetes mellitus: Secondary | ICD-10-CM

## 2014-08-07 DIAGNOSIS — R197 Diarrhea, unspecified: Secondary | ICD-10-CM | POA: Diagnosis not present

## 2014-08-07 DIAGNOSIS — Z87891 Personal history of nicotine dependence: Secondary | ICD-10-CM | POA: Diagnosis not present

## 2014-08-07 DIAGNOSIS — H3530 Unspecified macular degeneration: Secondary | ICD-10-CM | POA: Diagnosis present

## 2014-08-07 DIAGNOSIS — R509 Fever, unspecified: Secondary | ICD-10-CM | POA: Diagnosis not present

## 2014-08-07 DIAGNOSIS — E785 Hyperlipidemia, unspecified: Secondary | ICD-10-CM | POA: Diagnosis present

## 2014-08-07 DIAGNOSIS — G629 Polyneuropathy, unspecified: Secondary | ICD-10-CM

## 2014-08-07 DIAGNOSIS — I2510 Atherosclerotic heart disease of native coronary artery without angina pectoris: Secondary | ICD-10-CM | POA: Diagnosis present

## 2014-08-07 DIAGNOSIS — J09X2 Influenza due to identified novel influenza A virus with other respiratory manifestations: Secondary | ICD-10-CM | POA: Diagnosis not present

## 2014-08-07 DIAGNOSIS — R531 Weakness: Secondary | ICD-10-CM | POA: Diagnosis not present

## 2014-08-07 DIAGNOSIS — N189 Chronic kidney disease, unspecified: Secondary | ICD-10-CM | POA: Diagnosis present

## 2014-08-07 DIAGNOSIS — J101 Influenza due to other identified influenza virus with other respiratory manifestations: Secondary | ICD-10-CM | POA: Diagnosis not present

## 2014-08-07 DIAGNOSIS — I129 Hypertensive chronic kidney disease with stage 1 through stage 4 chronic kidney disease, or unspecified chronic kidney disease: Secondary | ICD-10-CM | POA: Diagnosis present

## 2014-08-07 DIAGNOSIS — R05 Cough: Secondary | ICD-10-CM | POA: Diagnosis not present

## 2014-08-07 LAB — URINALYSIS, ROUTINE W REFLEX MICROSCOPIC
GLUCOSE, UA: NEGATIVE mg/dL
HGB URINE DIPSTICK: NEGATIVE
Ketones, ur: 15 mg/dL — AB
LEUKOCYTES UA: NEGATIVE
Nitrite: NEGATIVE
PROTEIN: 30 mg/dL — AB
SPECIFIC GRAVITY, URINE: 1.022 (ref 1.005–1.030)
Urobilinogen, UA: 1 mg/dL (ref 0.0–1.0)
pH: 5.5 (ref 5.0–8.0)

## 2014-08-07 LAB — CBC
HEMATOCRIT: 32.1 % — AB (ref 39.0–52.0)
HEMOGLOBIN: 10.2 g/dL — AB (ref 13.0–17.0)
MCH: 25.2 pg — ABNORMAL LOW (ref 26.0–34.0)
MCHC: 31.8 g/dL (ref 30.0–36.0)
MCV: 79.5 fL (ref 78.0–100.0)
Platelets: 145 10*3/uL — ABNORMAL LOW (ref 150–400)
RBC: 4.04 MIL/uL — ABNORMAL LOW (ref 4.22–5.81)
RDW: 17.5 % — ABNORMAL HIGH (ref 11.5–15.5)
WBC: 6.7 10*3/uL (ref 4.0–10.5)

## 2014-08-07 LAB — I-STAT TROPONIN, ED: Troponin i, poc: 0.02 ng/mL (ref 0.00–0.08)

## 2014-08-07 LAB — BASIC METABOLIC PANEL
Anion gap: 9 (ref 5–15)
BUN: 23 mg/dL (ref 6–23)
CALCIUM: 8.2 mg/dL — AB (ref 8.4–10.5)
CO2: 19 mmol/L (ref 19–32)
Chloride: 107 mmol/L (ref 96–112)
Creatinine, Ser: 1.61 mg/dL — ABNORMAL HIGH (ref 0.50–1.35)
GFR calc Af Amer: 44 mL/min — ABNORMAL LOW (ref >90)
GFR, EST NON AFRICAN AMERICAN: 38 mL/min — AB (ref >90)
GLUCOSE: 112 mg/dL — AB (ref 70–99)
Potassium: 4.2 mmol/L (ref 3.5–5.1)
Sodium: 135 mmol/L (ref 135–145)

## 2014-08-07 LAB — INFLUENZA PANEL BY PCR (TYPE A & B)
H1N1FLUPCR: DETECTED — AB
INFLBPCR: NEGATIVE
Influenza A By PCR: POSITIVE — AB

## 2014-08-07 LAB — I-STAT CG4 LACTIC ACID, ED: LACTIC ACID, VENOUS: 1.32 mmol/L (ref 0.5–2.0)

## 2014-08-07 LAB — URINE MICROSCOPIC-ADD ON

## 2014-08-07 MED ORDER — ACETAMINOPHEN 325 MG PO TABS: 325.0000 mg | ORAL_TABLET | Freq: Once | ORAL | Status: DC

## 2014-08-07 MED ORDER — CHLORDIAZEPOXIDE HCL 25 MG PO CAPS
25.0000 mg | ORAL_CAPSULE | ORAL | Status: DC | PRN
  Administered 2014-08-08: 25 mg via ORAL
  Filled 2014-08-07: qty 1

## 2014-08-07 MED ORDER — SODIUM CHLORIDE 0.9 % IV SOLN
INTRAVENOUS | Status: DC
  Administered 2014-08-07: via INTRAVENOUS
  Administered 2014-08-08: 1000 mL via INTRAVENOUS

## 2014-08-07 MED ORDER — GUAIFENESIN 100 MG/5ML PO SYRP
200.0000 mg | ORAL_SOLUTION | Freq: Three times a day (TID) | ORAL | Status: DC | PRN
  Filled 2014-08-07: qty 10

## 2014-08-07 MED ORDER — AMLODIPINE BESYLATE 2.5 MG PO TABS
2.5000 mg | ORAL_TABLET | Freq: Every day | ORAL | Status: DC
  Administered 2014-08-08 – 2014-08-09 (×2): 2.5 mg via ORAL
  Filled 2014-08-07 (×2): qty 1

## 2014-08-07 MED ORDER — GABAPENTIN 800 MG PO TABS
800.0000 mg | ORAL_TABLET | Freq: Three times a day (TID) | ORAL | Status: DC
  Filled 2014-08-07: qty 1

## 2014-08-07 MED ORDER — ACETAMINOPHEN 325 MG PO TABS
650.0000 mg | ORAL_TABLET | Freq: Four times a day (QID) | ORAL | Status: DC | PRN
  Administered 2014-08-08 (×2): 650 mg via ORAL
  Filled 2014-08-07 (×2): qty 2

## 2014-08-07 MED ORDER — SIMVASTATIN 20 MG PO TABS
20.0000 mg | ORAL_TABLET | Freq: Every day | ORAL | Status: DC
  Administered 2014-08-08 (×2): 20 mg via ORAL
  Filled 2014-08-07 (×3): qty 1

## 2014-08-07 MED ORDER — HEPARIN SODIUM (PORCINE) 5000 UNIT/ML IJ SOLN
5000.0000 [IU] | Freq: Three times a day (TID) | INTRAMUSCULAR | Status: DC
  Administered 2014-08-08 – 2014-08-09 (×6): 5000 [IU] via SUBCUTANEOUS
  Filled 2014-08-07 (×8): qty 1

## 2014-08-07 MED ORDER — FLUTICASONE PROPIONATE 50 MCG/ACT NA SUSP
1.0000 | NASAL | Status: DC | PRN
  Filled 2014-08-07: qty 16

## 2014-08-07 MED ORDER — NEBIVOLOL HCL 5 MG PO TABS
5.0000 mg | ORAL_TABLET | Freq: Every day | ORAL | Status: DC
  Administered 2014-08-08 (×2): 5 mg via ORAL
  Filled 2014-08-07 (×3): qty 1

## 2014-08-07 MED ORDER — SODIUM CHLORIDE 0.9 % IV SOLN
Freq: Once | INTRAVENOUS | Status: AC
Start: 2014-08-07 — End: 2014-08-07
  Administered 2014-08-07: 20:00:00 via INTRAVENOUS

## 2014-08-07 MED ORDER — OSELTAMIVIR PHOSPHATE 30 MG PO CAPS
30.0000 mg | ORAL_CAPSULE | Freq: Two times a day (BID) | ORAL | Status: DC
  Administered 2014-08-08 – 2014-08-09 (×4): 30 mg via ORAL
  Filled 2014-08-07 (×5): qty 1

## 2014-08-07 MED ORDER — ASPIRIN EC 81 MG PO TBEC
81.0000 mg | DELAYED_RELEASE_TABLET | Freq: Every day | ORAL | Status: DC
  Administered 2014-08-08 – 2014-08-09 (×2): 81 mg via ORAL
  Filled 2014-08-07 (×2): qty 1

## 2014-08-07 MED ORDER — CLOPIDOGREL BISULFATE 75 MG PO TABS
75.0000 mg | ORAL_TABLET | Freq: Every day | ORAL | Status: DC
  Administered 2014-08-08 – 2014-08-09 (×2): 75 mg via ORAL
  Filled 2014-08-07 (×3): qty 1

## 2014-08-07 MED ORDER — SODIUM CHLORIDE 0.9 % IV BOLUS (SEPSIS)
1000.0000 mL | Freq: Once | INTRAVENOUS | Status: AC
  Administered 2014-08-07: 1000 mL via INTRAVENOUS

## 2014-08-07 MED ORDER — GABAPENTIN 400 MG PO CAPS
800.0000 mg | ORAL_CAPSULE | Freq: Three times a day (TID) | ORAL | Status: DC
  Administered 2014-08-08 – 2014-08-09 (×6): 800 mg via ORAL
  Filled 2014-08-07 (×8): qty 2

## 2014-08-07 NOTE — H&P (Signed)
[facility] History and Physical  Corey Larson. GEX:[id number] DOB: 07/30/1927 DOA: 08/07/2014  Referring physician: EDP PCP: Thressa Sheller, MD   Chief Complaint: ILI   HPI: Corey Larson. is a 81 y.o. male who presents to the ED with 1 day history of generalized weakness, cough, URI symptoms, muscle aches, body aches.  He didn't know he was running a fever he says (fever is 103 in the ED).  Mildly hypoxic at UC, improved on [place], sent to ED.  Review of Systems: Systems reviewed.  As above, otherwise negative  Past Medical History  Diagnosis Date  . CAD (coronary artery disease) 2005    stent to LAD by Dr. Glade Lloyd. mild irregulatities of RCA.    . OSA (obstructive sleep apnea)     mild, never on cpap  . Hyperlipidemia LDL goal < 70   . H/O exercise stress test 05/2012    no statistically significant ischemia.   . Peripheral neuropathy   . Macular degeneration     bilateral  . Hypertension   . CRF (chronic renal failure)   . Insomnia   . ED (erectile dysfunction)   . Vitamin D deficiency   . Abnormality of gait 11/05/2013  . History of TIA (transient ischemic attack)     transient confusion  . Macular degeneration     Right>left  . HOH (hard of hearing)     hearing aids   Past Surgical History  Procedure Laterality Date  . Cardiac catheterization      2005, stent placed DES LAD  . Knee surgery      right knee 2011   . Ankle surgery      right ankle 2002 fracture  . Coronary angioplasty    . Back surgery  1976    laminectomy  . Hernia repair  1981    "double hernia"  . Cataract extraction      bilateral  . Left heart catheterization with coronary angiogram N/A 04/16/2013    Procedure: LEFT HEART CATHETERIZATION WITH CORONARY ANGIOGRAM;  Surgeon: Leonie Man, MD;  Location: [facility] CATH LAB;  Service: Cardiovascular;  Laterality: N/A;   Social History:  reports that he quit smoking about 31 years ago. His smoking use included Cigarettes and Pipe.  He quit after 30 years of use. He has never used smokeless tobacco. He reports that he drinks alcohol. He reports that he does not use illicit drugs.  Allergies  Allergen Reactions  . Klonopin [Clonazepam] Other (See Comments)    Sleep walking episodes    Family History  Problem Relation Age of Onset  . Diabetes Sister      Prior to Admission medications   Medication Sig Start Date End Date Taking? Authorizing Provider  acetaminophen (TYLENOL) 500 MG tablet Take 1,500 mg by mouth daily as needed for moderate pain.   Yes Historical Provider, MD  amLODipine (NORVASC) 2.5 MG tablet Take 1 tablet by mouth daily. 11/07/13  Yes Historical Provider, MD  aspirin 81 MG tablet Take 81 mg by mouth daily.   Yes Historical Provider, MD  B Complex-C (B-COMPLEX WITH VITAMIN C) tablet Take 1 tablet by mouth daily.   Yes Historical Provider, MD  chlordiazePOXIDE (LIBRIUM) 25 MG capsule Take 25 mg by mouth as needed (for sleep).    Yes Historical Provider, MD  clopidogrel (PLAVIX) 75 MG tablet Take 1 tablet (75 mg total) by mouth daily with breakfast. 05/03/14  Yes Troy Sine, MD  fluticasone [facility]) 50 MCG/ACT  nasal spray Place 1 spray into both nostrils as needed for allergies or rhinitis.   Yes Historical Provider, MD  gabapentin (NEURONTIN) 800 MG tablet Take 800 mg by mouth 3 (three) times daily.   Yes Historical Provider, MD  guaifenesin (ROBITUSSIN) 100 MG/5ML syrup Take 200 mg by mouth 3 (three) times daily as needed for cough or congestion.   Yes Historical Provider, MD  nebivolol (BYSTOLIC) 10 MG tablet Take 5 mg by mouth at bedtime. Takes 1/2 tablet   Yes Historical Provider, MD  nitroGLYCERIN (NITROSTAT) 0.4 MG SL tablet Place 1 tablet (0.4 mg total) under the tongue every 5 (five) minutes x 3 doses as needed for chest pain. 04/26/14   Troy Sine, MD  simvastatin (ZOCOR) 40 MG tablet Take 20 mg by mouth at bedtime.   Yes Historical Provider, MD  tadalafil (CIALIS) 20 MG tablet Take 20 mg  by mouth daily as needed for erectile dysfunction.   Yes Historical Provider, MD   Physical Exam: Filed Vitals:   08/07/14 2100  BP: 119/58  Pulse: 70  Temp:   Resp: 20    BP 119/58 mmHg  Pulse 70  Temp(Src) 98.1 F (36.7 C) (Oral)  Resp 20  SpO2 95%  General Appearance:    Alert, oriented, no distress, appears stated age  Head:    Normocephalic, atraumatic  Eyes:    PERRL, EOMI, sclera non-icteric        Nose:   Nares without drainage or epistaxis. Mucosa, turbinates normal  Throat:   Moist mucous membranes. Oropharynx without erythema or exudate.  Neck:   Supple. No carotid bruits.  No thyromegaly.  No lymphadenopathy.   Back:     No CVA tenderness, no spinal tenderness  Lungs:     Clear to auscultation bilaterally, without wheezes, rhonchi or rales  Chest wall:    No tenderness to palpitation  Heart:    Regular rate and rhythm without murmurs, gallops, rubs  Abdomen:     Soft, non-tender, nondistended, normal bowel sounds, no organomegaly  Genitalia:    deferred  Rectal:    deferred  Extremities:   No clubbing, cyanosis or edema.  Pulses:   2+ and symmetric all extremities  Skin:   Skin color, texture, turgor normal, no rashes or lesions  Lymph nodes:   Cervical, supraclavicular, and axillary nodes normal  Neurologic:   CNII-XII intact. Normal strength, sensation and reflexes      throughout    Labs on Admission:  Basic Metabolic Panel:  Recent Labs Lab 08/07/14 1735  NA 135  K 4.2  CL 107  CO2 19  GLUCOSE 112*  BUN 23  CREATININE 1.61*  CALCIUM 8.2*   Liver Function Tests: No results for input(s): AST, ALT, ALKPHOS, BILITOT, PROT, ALBUMIN in the last 168 hours. No results for input(s): LIPASE, AMYLASE in the last 168 hours. No results for input(s): AMMONIA in the last 168 hours. CBC:  Recent Labs Lab 08/07/14 1735  WBC 6.7  HGB 10.2*  HCT 32.1*  MCV 79.5  PLT 145*   Cardiac Enzymes: No results for input(s): CKTOTAL, CKMB, CKMBINDEX, TROPONINI  in the last 168 hours.  BNP (last 3 results) No results for input(s): PROBNP in the last 8760 hours. CBG: No results for input(s): GLUCAP in the last 168 hours.  Radiological Exams on Admission: Dg Chest 2 View  08/07/2014   CLINICAL DATA:  Fevers, headache. Weakness and congestion since Wednesday. Ex-smoker.  EXAM: CHEST  2 VIEW  COMPARISON:  04/15/2013  FINDINGS: Hyperinflation. Patient rotated right. Midline trachea. Mild cardiomegaly. Tortuous thoracic aorta. No pleural effusion or pneumothorax. No congestive failure. Mild right base volume loss.  IMPRESSION: Hyperinflation cardiomegaly.  No acute superimposed process.   Electronically Signed   By: Abigail Miyamoto M.D.   On: 08/07/2014 19:03    EKG: Independently reviewed.  Assessment/Plan Principal Problem:   Influenza A (H1N1)   1. Influenza A H1N1 - despite having had flu vaccine this year per patient. 1. tamiflu 2. IVF 3. PT/OT 4. Tylenol for fever 2. HTN - continue home meds    Code Status: Full  Family Communication: No family in room Disposition Plan: Admit to inpatient   Time spent: 74 min  GARDNER, JARED M. [facility] Pager [phone]  If 7AM-7PM, please contact the day team taking care of the patient [email or web address] Password [facility] 08/07/2014, 9:58 PM

## 2014-08-07 NOTE — Progress Notes (Signed)
Report received form Miguel Aschoff, RN in ED.  Awaiting pt's arrival.

## 2014-08-07 NOTE — ED Notes (Signed)
Patient transported to X-ray 

## 2014-08-07 NOTE — ED Notes (Signed)
Hes had cough, cold symptoms since Wednesday. He has been traveling back and forth to [place] daily this week and staying in a hotel there. When he woke this am he was too weak to get out of bed and wife had to get hotel staff to help him from bed to car in wheelchair. He denies pain. He is A&O.

## 2014-08-07 NOTE — ED Provider Notes (Addendum)
CSN: [id number]     Arrival date & time 08/07/14  1649 History   First MD Initiated Contact with Patient 08/07/14 1704     Chief Complaint  Patient presents with  . Weakness  . Cough     (Consider location/radiation/quality/duration/timing/severity/associated sxs/prior Treatment) HPI Comments: Patient presents to the ER for evaluation of generalized weakness and cold symptoms. Symptoms began 2 days ago. Yesterday, patient became very weak. He was staying in a hotel, patient was too weak to even get out of bed. Hotel staff had to help get him out of bed. Wife brought him home yesterday and he continued to be very weak. He has had a cough associated with the generalized weakness. He was brought to urgent care today for evaluation and was noted to be hypoxic, was sent to the ER.  In addition to the generalized weakness, wife reports that he has been confused. When she was driving here, he was in the passenger seat, and kept saying that he could not reach the brake pedal. He does not feel short of breath. He denies headache, neck pain and stiffness, chest pain, abdominal pain. He has not had any nausea, vomiting or diarrhea. He denies urinary symptoms.  Patient is a 81 y.o. male presenting with weakness and cough.  Weakness Pertinent negatives include no chest pain.  Cough Associated symptoms: chills   Associated symptoms: no chest pain     Past Medical History  Diagnosis Date  . CAD (coronary artery disease) 2005    stent to LAD by Dr. Glade Lloyd. mild irregulatities of RCA.    . OSA (obstructive sleep apnea)     mild, never on cpap  . Hyperlipidemia LDL goal < 70   . H/O exercise stress test 05/2012    no statistically significant ischemia.   . Peripheral neuropathy   . Macular degeneration     bilateral  . Hypertension   . CRF (chronic renal failure)   . Insomnia   . ED (erectile dysfunction)   . Vitamin D deficiency   . Abnormality of gait 11/05/2013  . History of TIA (transient  ischemic attack)     transient confusion  . Macular degeneration     Right>left  . HOH (hard of hearing)     hearing aids   Past Surgical History  Procedure Laterality Date  . Cardiac catheterization      2005, stent placed DES LAD  . Knee surgery      right knee 2011   . Ankle surgery      right ankle 2002 fracture  . Coronary angioplasty    . Back surgery  1976    laminectomy  . Hernia repair  1981    "double hernia"  . Cataract extraction      bilateral  . Left heart catheterization with coronary angiogram N/A 04/16/2013    Procedure: LEFT HEART CATHETERIZATION WITH CORONARY ANGIOGRAM;  Surgeon: Leonie Man, MD;  Location: [facility] CATH LAB;  Service: Cardiovascular;  Laterality: N/A;   Family History  Problem Relation Age of Onset  . Diabetes Sister    History  Substance Use Topics  . Smoking status: Former Smoker -- 30 years    Types: Cigarettes, Pipe    Quit date: 04/16/1983  . Smokeless tobacco: Never Used  . Alcohol Use: Yes     Comment: rarely    Review of Systems  Constitutional: Positive for chills and fatigue.  Respiratory: Positive for cough.   Cardiovascular: Negative for chest pain.  Gastrointestinal: Negative.   Neurological: Positive for weakness.  Psychiatric/Behavioral: Positive for confusion.  All other systems reviewed and are negative.     Allergies  Klonopin  Home Medications   Prior to Admission medications   Medication Sig Start Date End Date Taking? Authorizing Provider  acetaminophen (TYLENOL) 500 MG tablet Take 1,500 mg by mouth daily as needed for moderate pain.   Yes Historical Provider, MD  amLODipine (NORVASC) 2.5 MG tablet Take 1 tablet by mouth daily. 11/07/13  Yes Historical Provider, MD  aspirin 81 MG tablet Take 81 mg by mouth daily.   Yes Historical Provider, MD  B Complex-C (B-COMPLEX WITH VITAMIN C) tablet Take 1 tablet by mouth daily.   Yes Historical Provider, MD  chlordiazePOXIDE (LIBRIUM) 25 MG capsule Take 25 mg by  mouth as needed (for sleep).    Yes Historical Provider, MD  clopidogrel (PLAVIX) 75 MG tablet Take 1 tablet (75 mg total) by mouth daily with breakfast. 05/03/14  Yes Troy Sine, MD  fluticasone [facility]) 50 MCG/ACT nasal spray Place 1 spray into both nostrils as needed for allergies or rhinitis.   Yes Historical Provider, MD  gabapentin (NEURONTIN) 800 MG tablet Take 800 mg by mouth 3 (three) times daily.   Yes Historical Provider, MD  guaifenesin (ROBITUSSIN) 100 MG/5ML syrup Take 200 mg by mouth 3 (three) times daily as needed for cough or congestion.   Yes Historical Provider, MD  nebivolol (BYSTOLIC) 10 MG tablet Take 5 mg by mouth at bedtime. Takes 1/2 tablet   Yes Historical Provider, MD  nitroGLYCERIN (NITROSTAT) 0.4 MG SL tablet Place 1 tablet (0.4 mg total) under the tongue every 5 (five) minutes x 3 doses as needed for chest pain. 04/26/14   Troy Sine, MD  simvastatin (ZOCOR) 40 MG tablet Take 20 mg by mouth at bedtime.   Yes Historical Provider, MD  tadalafil (CIALIS) 20 MG tablet Take 20 mg by mouth daily as needed for erectile dysfunction.   Yes Historical Provider, MD   BP 119/58 mmHg  Pulse 70  Temp(Src) 98.1 F (36.7 C) (Oral)  Resp 20  SpO2 95% Physical Exam  Constitutional: He is oriented to person, place, and time. He appears well-developed and well-nourished. No distress.  HENT:  Head: Normocephalic and atraumatic.  Right Ear: Hearing normal.  Left Ear: Hearing normal.  Nose: Nose normal.  Mouth/Throat: Oropharynx is clear and moist and mucous membranes are normal.  Eyes: Conjunctivae and EOM are normal. Pupils are equal, round, and reactive to light.  Neck: Normal range of motion. Neck supple.  Cardiovascular: Regular rhythm, S1 normal and S2 normal.  Exam reveals no gallop and no friction rub.   No murmur heard. Pulmonary/Chest: Effort normal and breath sounds normal. No respiratory distress. He exhibits no tenderness.  Abdominal: Soft. Normal appearance  and bowel sounds are normal. There is no hepatosplenomegaly. There is no tenderness. There is no rebound, no guarding, no tenderness at McBurney's point and negative Murphy's sign. No hernia.  Musculoskeletal: Normal range of motion.  Neurological: He is alert and oriented to person, place, and time. He has normal strength. No cranial nerve deficit or sensory deficit. Coordination normal. GCS eye subscore is 4. GCS verbal subscore is 5. GCS motor subscore is 6.  Extraocular muscle movement: normal No visual field cut Pupils: equal and reactive both direct and consensual response is normal No nystagmus present    Sensory function is intact to light touch, pinprick Proprioception intact  Grip strength 5/5  symmetric in upper extremities Lower extremity strength 5/5 against gravity No pronator drift Normal finger to nose bilaterally Normal heel to shin bilaterally  Gait: normal    Skin: Skin is warm, dry and intact. No rash noted. No cyanosis.  Psychiatric: He has a normal mood and affect. His speech is normal and behavior is normal. Thought content normal.  Nursing note and vitals reviewed.   ED Course  Procedures (including critical care time) Labs Review Labs Reviewed  CBC - Abnormal; Notable for the following:    RBC 4.04 (*)    Hemoglobin 10.2 (*)    HCT 32.1 (*)    MCH 25.2 (*)    RDW 17.5 (*)    Platelets 145 (*)    All other components within normal limits  BASIC METABOLIC PANEL - Abnormal; Notable for the following:    Glucose, Bld 112 (*)    Creatinine, Ser 1.61 (*)    Calcium 8.2 (*)    GFR calc non Af Amer 38 (*)    GFR calc Af Amer 44 (*)    All other components within normal limits  URINALYSIS, ROUTINE W REFLEX MICROSCOPIC - Abnormal; Notable for the following:    Color, Urine AMBER (*)    Bilirubin Urine SMALL (*)    Ketones, ur 15 (*)    Protein, ur 30 (*)    All other components within normal limits  INFLUENZA PANEL BY PCR (TYPE A & B, H1N1) - Abnormal;  Notable for the following:    Influenza A By PCR POSITIVE (*)    H1N1 flu by pcr DETECTED (*)    All other components within normal limits  URINE MICROSCOPIC-ADD ON - Abnormal; Notable for the following:    Casts HYALINE CASTS (*)    All other components within normal limits  CULTURE, BLOOD (ROUTINE X 2)  CULTURE, BLOOD (ROUTINE X 2)  URINE CULTURE  I-STAT TROPOININ, ED  I-STAT CG4 LACTIC ACID, ED    Imaging Review Dg Chest 2 View  08/07/2014   CLINICAL DATA:  Fevers, headache. Weakness and congestion since Wednesday. Ex-smoker.  EXAM: CHEST  2 VIEW  COMPARISON:  04/15/2013  FINDINGS: Hyperinflation. Patient rotated right. Midline trachea. Mild cardiomegaly. Tortuous thoracic aorta. No pleural effusion or pneumothorax. No congestive failure. Mild right base volume loss.  IMPRESSION: Hyperinflation cardiomegaly.  No acute superimposed process.   Electronically Signed   By: Abigail Miyamoto M.D.   On: 08/07/2014 19:03     EKG Interpretation   Date/Time:  Saturday August 07 2014 16:55:29 EST Ventricular Rate:  83 PR Interval:  188 QRS Duration: 78 QT Interval:  362 QTC Calculation: 425 R Axis:   -8 Text Interpretation:  Normal sinus rhythm Nonspecific ST and T wave  abnormality Abnormal ECG No significant change since last tracing  Confirmed by Reniyah Gootee  MD, [place] [phone]) on 08/07/2014 5:29:49 PM      MDM   Final diagnoses:  Fever  Influenza A (H1N1)    Patient presents to the ER for evaluation of generalized weakness and fever. Patient became ill in the last 2 days. He has become extremely weak, unable to ambulate on his own. He has had a cough associated with the symptoms. Patient was reportedly hypoxic at urgent care earlier, but has not been hypoxic here in the ER. Chest x-ray does not show evidence of pneumonia.  Patient did become mildly hypotensive here in the ER, but he has responded to IV fluids. He is still extremely weak here  in the ER. He has been unable to ambulate  secondary to generalized weakness. He required 2 assistants to walk 5 feet, could not stand on his own.  Wife reports some confusion prior to arrival here in the ER. I have not seen that here. He does not appear to have any signs of CNS infection. There is no meningismus on exam.   Patient's blood work has been essentially normal here in the ER. He was positive for influenza A H1 N1. Patient initiated on Tamiflu. Patient will be hospitalized for further management of fever with acute delirium and severe generalized weakness.    Orpah Greek, MD 08/07/14 3810  Orpah Greek, MD 08/07/14 2147

## 2014-08-07 NOTE — Progress Notes (Signed)
NURSING PROGRESS NOTE  Corey Larson [id number] Admission Data: 08/07/2014 11:42 PM Attending Provider: Etta Quill, DO CWU:[id number],TUUEK, MD Code Status: Full  Corey Larson. is a 81 y.o. male patient admitted from ED:  -No acute distress noted.  -No complaints of shortness of breath.  -No complaints of chest pain.   Cardiac Monitoring: Box # 25 in place. Cardiac monitor yields:SR.  Blood pressure 140/73, pulse 76, temperature 98.9 F (37.2 C), temperature source Oral, resp. rate 18, SpO2 99 %.   IV Fluids:  IV in place, occlusive dsg intact without redness, IV cath intact in Rt arm Allergies:  Klonopin  Past Medical History:   has a past medical history of CAD (coronary artery disease) (2005); OSA (obstructive sleep apnea); Hyperlipidemia LDL goal < 70; H/O exercise stress test (05/2012); Peripheral neuropathy; Macular degeneration; Hypertension; CRF (chronic renal failure); Insomnia; ED (erectile dysfunction); Vitamin D deficiency; Abnormality of gait (11/05/2013); History of TIA (transient ischemic attack); Macular degeneration; and HOH (hard of hearing).  Past Surgical History:   has past surgical history that includes Cardiac catheterization; Knee surgery; Ankle surgery; Coronary angioplasty; Back surgery (1976); Hernia repair (1981); Cataract extraction; and left heart catheterization with coronary angiogram (N/A, 04/16/2013).  Social History:   reports that he quit smoking about 31 years ago. His smoking use included Cigarettes and Pipe. He quit after 30 years of use. He has never used smokeless tobacco. He reports that he drinks alcohol. He reports that he does not use illicit drugs.  Skin: Ecchymosis in bilateral arms  Patient/Family orientated to room. Information packet given to patient/family. Admission inpatient armband information verified with patient/family to include name and date of birth and placed on patient arm. Side rails up x 2, fall assessment and  education completed with patient/family. Patient/family able to verbalize understanding of risk associated with falls and verbalized understanding to call for assistance before getting out of bed. Call light within reach. Patient/family able to voice and demonstrate understanding of unit orientation instructions.

## 2014-08-08 ENCOUNTER — Encounter (HOSPITAL_COMMUNITY): Payer: Self-pay | Admitting: *Deleted

## 2014-08-08 DIAGNOSIS — R197 Diarrhea, unspecified: Secondary | ICD-10-CM

## 2014-08-08 DIAGNOSIS — E785 Hyperlipidemia, unspecified: Secondary | ICD-10-CM

## 2014-08-08 DIAGNOSIS — G629 Polyneuropathy, unspecified: Secondary | ICD-10-CM

## 2014-08-08 LAB — BASIC METABOLIC PANEL
Anion gap: 7 (ref 5–15)
BUN: 17 mg/dL (ref 6–23)
CHLORIDE: 109 mmol/L (ref 96–112)
CO2: 22 mmol/L (ref 19–32)
Calcium: 8 mg/dL — ABNORMAL LOW (ref 8.4–10.5)
Creatinine, Ser: 1.26 mg/dL (ref 0.50–1.35)
GFR calc Af Amer: 59 mL/min — ABNORMAL LOW (ref >90)
GFR calc non Af Amer: 51 mL/min — ABNORMAL LOW (ref >90)
GLUCOSE: 105 mg/dL — AB (ref 70–99)
POTASSIUM: 3.9 mmol/L (ref 3.5–5.1)
Sodium: 138 mmol/L (ref 135–145)

## 2014-08-08 LAB — CBC
HEMATOCRIT: 31.7 % — AB (ref 39.0–52.0)
HEMOGLOBIN: 10 g/dL — AB (ref 13.0–17.0)
MCH: 25.4 pg — ABNORMAL LOW (ref 26.0–34.0)
MCHC: 31.5 g/dL (ref 30.0–36.0)
MCV: 80.5 fL (ref 78.0–100.0)
Platelets: 130 10*3/uL — ABNORMAL LOW (ref 150–400)
RBC: 3.94 MIL/uL — AB (ref 4.22–5.81)
RDW: 17.5 % — ABNORMAL HIGH (ref 11.5–15.5)
WBC: 5.7 10*3/uL (ref 4.0–10.5)

## 2014-08-08 MED ORDER — LORAZEPAM 0.5 MG PO TABS
0.5000 mg | ORAL_TABLET | Freq: Once | ORAL | Status: AC
  Administered 2014-08-08: 0.5 mg via ORAL
  Filled 2014-08-08: qty 1

## 2014-08-08 MED ORDER — LOPERAMIDE HCL 2 MG PO CAPS
2.0000 mg | ORAL_CAPSULE | ORAL | Status: DC | PRN
  Administered 2014-08-08: 2 mg via ORAL
  Filled 2014-08-08: qty 1

## 2014-08-08 NOTE — Progress Notes (Signed)
Utilization Review Completed.   Krishauna Schatzman, RN, BSN Nurse Case Manager  

## 2014-08-08 NOTE — Progress Notes (Signed)
Pt spiked fever 102.9 this morning . Pt is found more restless this morning and is trying to get out of the bed  more frequently. On-call provider Fredirick Maudlin, NP notified via text page. Pt had blood culture and urine culture sent yesterday. Order received to give prn tylenol at this time. Will cont to monitor.

## 2014-08-08 NOTE — Evaluation (Signed)
Occupational Therapy Evaluation and Discharge  Patient Details Name: Corey Larson. MRN: [id number] DOB: [birth year] Today's Date: 08/08/2014    History of Present Illness Pt is an 81 y.o. Male admitted with the flu (H1N1) with symptoms of generalized weakness, cough, URI symptoms, muscle aches, body aches as well as a fever.   Clinical Impression   PTA pt was independent with ADLs and IADLs. He is limited by flu symptoms and generalized weakness. Pt requires min guard for functional mobility and ADLs due to weakness and feel that he will progress quickly once symptoms resolve. No further acute OT needs.     Follow Up Recommendations  No OT follow up    Equipment Recommendations  None recommended by OT    Recommendations for Other Services       Precautions / Restrictions Precautions Precautions: Fall Precaution Comments: due to weakness Restrictions Weight Bearing Restrictions: No      Mobility Bed Mobility Overal bed mobility: Needs Assistance Bed Mobility: Supine to Sit;Sit to Supine     Supine to sit: Min guard Sit to supine: Min guard   General bed mobility comments: Min guard, no physical assist needed. Pt able to pull on hand rail to get up.   Transfers Overall transfer level: Needs assistance Equipment used: 1 person hand held assist Transfers: Sit to/from [facility] Sit to Stand: Min guard Stand pivot transfers: Min guard       General transfer comment: Min guard assist for safety due to generalized weakness.          ADL Overall ADL's : Needs assistance/impaired                                       General ADL Comments: Pt requires min guard for functional mobility and ADLs due to weakness from the flu. Suspect pt will progress quickly once symptoms resolve and will return to [place].                Pertinent Vitals/Pain Pain Assessment: No/denies pain     Hand Dominance     Extremity/Trunk  Assessment Upper Extremity Assessment Upper Extremity Assessment: Generalized weakness   Lower Extremity Assessment Lower Extremity Assessment: Generalized weakness   Cervical / Trunk Assessment Cervical / Trunk Assessment: Normal   Communication Communication Communication: No difficulties   Cognition Arousal/Alertness: Awake/alert Behavior During Therapy: WFL for tasks assessed/performed Overall Cognitive Status: Within Functional Limits for tasks assessed                                Home Living Family/patient expects to be discharged to:: Private residence Living Arrangements: Spouse/significant other Available Help at Discharge: Family;Available PRN/intermittently Type of Home: House Home Access: Stairs to enter Entrance Stairs-Number of Steps: 3   Home Layout: Two level;Bed/bath upstairs Alternate Level Stairs-Number of Steps: 12 Alternate Level Stairs-Rails: Right           Home Equipment: None          Prior Functioning/Environment Level of Independence: Independent        Comments: Hd gotten back from a meeting in [place] when symptoms began    OT Diagnosis: Generalized weakness                End of Session  Activity Tolerance: Patient tolerated treatment well Patient left: in  bed;with call bell/phone within reach;with bed alarm set   Time: 1401-1415 OT Time Calculation (min): 14 min Charges:  OT General Charges $OT Visit: 1 Procedure OT Evaluation $Initial OT Evaluation Tier I: 1 Procedure G-Codes:    Juluis Rainier Aug 13, 2014, 2:49 PM  Cyndie Chime, OTR/L Occupational Therapist [phone] (pager)

## 2014-08-08 NOTE — Plan of Care (Signed)
Problem: Phase III Progression Outcomes Goal: Foley discontinued Outcome: Not Applicable Date Met:  40/39/79 Pt voids

## 2014-08-08 NOTE — Evaluation (Signed)
Physical Therapy Evaluation Patient Details Name: Corey Larson. MRN: [id number] DOB: Mar 03, 1928 Today's Date: 08/08/2014   History of Present Illness  Admitted with the flu, H1N1  Past Medical History  Diagnosis Date  . CAD (coronary artery disease) 2005    stent to LAD by Dr. Glade Lloyd. mild irregulatities of RCA.    . OSA (obstructive sleep apnea)     mild, never on cpap  . Hyperlipidemia LDL goal < 70   . H/O exercise stress test 05/2012    no statistically significant ischemia.   . Peripheral neuropathy   . Macular degeneration     bilateral  . Hypertension   . CRF (chronic renal failure)   . Insomnia   . ED (erectile dysfunction)   . Vitamin D deficiency   . Abnormality of gait 11/05/2013  . History of TIA (transient ischemic attack)     transient confusion  . Macular degeneration     Right>left  . HOH (hard of hearing)     hearing aids   Past Surgical History  Procedure Laterality Date  . Cardiac catheterization      2005, stent placed DES LAD  . Knee surgery      right knee 2011   . Ankle surgery      right ankle 2002 fracture  . Coronary angioplasty    . Back surgery  1976    laminectomy  . Hernia repair  1981    "double hernia"  . Cataract extraction      bilateral  . Left heart catheterization with coronary angiogram N/A 04/16/2013    Procedure: LEFT HEART CATHETERIZATION WITH CORONARY ANGIOGRAM;  Surgeon: Leonie Man, MD;  Location: [facility] CATH LAB;  Service: Cardiovascular;  Laterality: N/A;     Clinical Impression  Pt admitted with above diagnosis. Pt currently with functional limitations due to the deficits listed below (see PT Problem List).  Pt will benefit from skilled PT to increase their independence and safety with mobility to allow discharge to the venue listed below.       Follow Up Recommendations Outpatient PT    Equipment Recommendations  Other (comment) (perhaps cane, though may not need)    Recommendations for Other Services        Precautions / Restrictions Precautions Precautions: Fall Precaution Comments: fall risk present, but minimal; will likely be much improved as he gets better      Mobility  Bed Mobility Overal bed mobility: Needs Assistance Bed Mobility: Supine to Sit     Supine to sit: Mod assist     General bed mobility comments: Mod handheld assist to pull trunk to sit  Transfers Overall transfer level: Needs assistance Equipment used: 1 person hand held assist Transfers: Sit to/from Stand Sit to Stand: Min guard         General transfer comment: Cues for safety and hand placement and to self-monitor for activity tolerance; Overall good rise  Ambulation/Gait Ambulation/Gait assistance: Min assist Ambulation Distance (Feet): 150 Feet Assistive device: 1 person hand held assist (and pushing IV pole) Gait Pattern/deviations: Step-through pattern     General Gait Details: Cues to self-monitor for activity tolerance; Noted at times pt pushing IV pole too far ahead, getting his center of mass in front of his feet; cues fo rcontrol  Stairs            Wheelchair Mobility    Modified Rankin (Stroke Patients Only)       Balance  Pertinent Vitals/Pain Pain Assessment: No/denies pain (but mentioned neuropathy in feet)    Home Living Family/patient expects to be discharged to:: Private residence Living Arrangements: Spouse/significant other Available Help at Discharge: Family;Available PRN/intermittently Type of Home: House Home Access: Stairs to enter   [employer] of Steps: 3 Home Layout: Two level;Bed/bath upstairs Home Equipment: None      Prior Function Level of Independence: Independent         Comments: Hd gotten back from a meeting in [place] when symptoms began     Hand Dominance        Extremity/Trunk Assessment   Upper Extremity Assessment: Defer to OT evaluation            Lower Extremity Assessment: Generalized weakness         Communication   Communication: No difficulties  Cognition Arousal/Alertness: Awake/alert Behavior During Therapy: WFL for tasks assessed/performed Overall Cognitive Status: Within Functional Limits for tasks assessed                      General Comments General comments (skin integrity, edema, etc.): Pt mentioned neuropathic pain in feet, and how that is affecting his balance; Discussed follow-ing up with his PCP, and possibly Outpt PT for balance    Exercises        Assessment/Plan    PT Assessment Patient needs continued PT services  PT Diagnosis Difficulty walking;Generalized weakness   PT Problem List Decreased strength;Decreased activity tolerance;Decreased balance;Decreased mobility;Decreased knowledge of use of DME;Decreased knowledge of precautions  PT Treatment Interventions DME instruction;Gait training;Stair training;Functional mobility training;Therapeutic activities;Therapeutic exercise;Patient/family education   PT Goals (Current goals can be found in the Care Plan section) Acute Rehab PT Goals Patient Stated Goal: hopes to feel better and go home soom PT Goal Formulation: With patient Time For Goal Achievement: 08/22/14 Potential to Achieve Goals: Good    Frequency Min 3X/week   Barriers to discharge        Co-evaluation               End of Session   Activity Tolerance: Patient tolerated treatment well Patient left: in chair;with call bell/phone within reach;with chair alarm set Nurse Communication: Mobility status         Time: 2446-2863 PT Time Calculation (min) (ACUTE ONLY): 12 min   Charges:   PT Evaluation $Initial PT Evaluation Tier I: 1 Procedure     PT G CodesRoney Marion Hamff 08/08/2014, 1:40 PM  Roney Marion, [place] Pager [phone] Office [phone]

## 2014-08-08 NOTE — Progress Notes (Signed)
Pt is confused, agitated, and trying to get out of the bed constantly. Pt will be moved to camera room for safety. On-call provider Fredirick Maudlin, NP made aware of pt's activity. Will monitor.

## 2014-08-08 NOTE — Progress Notes (Signed)
[facility] PROGRESS NOTE  Corey Larson. [id number]:[id number] DOB: [birth year] DOA: 08/07/2014 PCP: Thressa Sheller, MD  Assessment/Plan: 1. Influenza A, H1N1 1. Pt is s/p flu vaccine this past year 2. On Tamiflu 3. Have advised close contacts to notify their PCP for consideration of prophylactic Tamiflu 4. Febrile this AM 5. Overall feels improved 6. Continue supportive care 2. HTN 1. BP stable 2. Cont amlodipine and bystolic per home regimen 3. CAD 1. Stable 2. Denies chest pain or sob 4. HLD 1. Cont statin per home regimen 5. Fevers 1. Secondary to above 2. Continue tylenol PRN 6. Diarrhea 1. Suspect secondary to flu 2. No recent abx 3. Imodium PRN 7. DVT prophylaxis 1. Heparin subQ  Code Status: Full Family Communication: Pt in room, family at bedside (indicate person spoken with, relationship, and if by phone, the number) Disposition Plan: Possible home 3/7   Consultants:    Procedures:    Antibiotics:  Tamiflu 3/5>>> (indicate start date, and stop date if known)  HPI/Subjective: Feels better today, but still weak and having diarrhea  Objective: Filed Vitals:   08/07/14 2315 08/08/14 0540 08/08/14 0653 08/08/14 1316  BP: 140/73 130/58  121/55  Pulse: 76 88  71  Temp: 98.9 F (37.2 C) 102.9 F (39.4 C) 98.1 F (36.7 C) 98.3 F (36.8 C)  TempSrc: Oral Oral Oral Oral  Resp: 18 18  14   Height: 5' 6.5" (1.689 m)     Weight: 79.6 kg (175 lb 7.8 oz)     SpO2: 99% 98%  98%    Intake/Output Summary (Last 24 hours) at 08/08/14 1451 Last data filed at 08/08/14 1444  Gross per 24 hour  Intake   1636 ml  Output   2155 ml  Net   -519 ml   Filed Weights   08/07/14 2315  Weight: 79.6 kg (175 lb 7.8 oz)    Exam:   General:  Awake, in nad  Cardiovascular: regular, s1, s2  Respiratory: normal resp effort, no wheezing  Abdomen: soft,nondistended  Musculoskeletal: perfused, no clubbing   Data Reviewed: Basic Metabolic Panel:  Recent  Labs Lab 08/07/14 1735 08/08/14 0537  NA 135 138  K 4.2 3.9  CL 107 109  CO2 19 22  GLUCOSE 112* 105*  BUN 23 17  CREATININE 1.61* 1.26  CALCIUM 8.2* 8.0*   Liver Function Tests: No results for input(s): AST, ALT, ALKPHOS, BILITOT, PROT, ALBUMIN in the last 168 hours. No results for input(s): LIPASE, AMYLASE in the last 168 hours. No results for input(s): AMMONIA in the last 168 hours. CBC:  Recent Labs Lab 08/07/14 1735 08/08/14 0537  WBC 6.7 5.7  HGB 10.2* 10.0*  HCT 32.1* 31.7*  MCV 79.5 80.5  PLT 145* 130*   Cardiac Enzymes: No results for input(s): CKTOTAL, CKMB, CKMBINDEX, TROPONINI in the last 168 hours. BNP (last 3 results) No results for input(s): BNP in the last 8760 hours.  ProBNP (last 3 results) No results for input(s): PROBNP in the last 8760 hours.  CBG: No results for input(s): GLUCAP in the last 168 hours.  Recent Results (from the past 240 hour(s))  Culture, blood (routine x 2)     Status: None (Preliminary result)   Collection Time: 08/07/14  5:35 PM  Result Value Ref Range Status   Specimen Description BLOOD RIGHT FOREARM  Final   Special Requests BOTTLES DRAWN AEROBIC AND ANAEROBIC 5CC  Final   Culture PENDING  Incomplete   Report Status PENDING  Incomplete  Studies: Dg Chest 2 View  08/07/2014   CLINICAL DATA:  Fevers, headache. Weakness and congestion since Wednesday. Ex-smoker.  EXAM: CHEST  2 VIEW  COMPARISON:  04/15/2013  FINDINGS: Hyperinflation. Patient rotated right. Midline trachea. Mild cardiomegaly. Tortuous thoracic aorta. No pleural effusion or pneumothorax. No congestive failure. Mild right base volume loss.  IMPRESSION: Hyperinflation cardiomegaly.  No acute superimposed process.   Electronically Signed   By: Abigail Miyamoto M.D.   On: 08/07/2014 19:03    Scheduled Meds: . acetaminophen  325 mg Oral Once  . amLODipine  2.5 mg Oral Daily  . aspirin EC  81 mg Oral Daily  . clopidogrel  75 mg Oral Q breakfast  . gabapentin   800 mg Oral TID  . heparin  5,000 Units Subcutaneous 3 times per day  . nebivolol  5 mg Oral QHS  . oseltamivir  30 mg Oral BID  . simvastatin  20 mg Oral QHS   Continuous Infusions: . sodium chloride 1,000 mL (08/08/14 0856)    Principal Problem:   Influenza A (H1N1) Active Problems:   CAD (coronary artery disease), Hx of LAD DES in 2005   Peripheral neuropathy   Hyperlipidemia with target LDL less than 70   Ovetta Bazzano, [facility] Pager [phone]. If 7PM-7AM, please contact night-coverage at [email or web address], password [facility] 08/08/2014, 2:51 PM  LOS: 1 day

## 2014-08-08 NOTE — Progress Notes (Signed)
Pt is getting out of the bed more frequently. Po ativan was given earlier with no effect. On-call provider Fredirick Maudlin, NP notified via text page. Will monitor.

## 2014-08-09 LAB — BASIC METABOLIC PANEL
ANION GAP: 6 (ref 5–15)
BUN: 14 mg/dL (ref 6–23)
CO2: 19 mmol/L (ref 19–32)
Calcium: 7.8 mg/dL — ABNORMAL LOW (ref 8.4–10.5)
Chloride: 112 mmol/L (ref 96–112)
Creatinine, Ser: 1.07 mg/dL (ref 0.50–1.35)
GFR calc Af Amer: 71 mL/min — ABNORMAL LOW (ref >90)
GFR calc non Af Amer: 62 mL/min — ABNORMAL LOW (ref >90)
Glucose, Bld: 103 mg/dL — ABNORMAL HIGH (ref 70–99)
Potassium: 3.9 mmol/L (ref 3.5–5.1)
Sodium: 137 mmol/L (ref 135–145)

## 2014-08-09 LAB — CBC
HEMATOCRIT: 31.3 % — AB (ref 39.0–52.0)
Hemoglobin: 9.9 g/dL — ABNORMAL LOW (ref 13.0–17.0)
MCH: 25 pg — ABNORMAL LOW (ref 26.0–34.0)
MCHC: 31.6 g/dL (ref 30.0–36.0)
MCV: 79 fL (ref 78.0–100.0)
Platelets: 129 10*3/uL — ABNORMAL LOW (ref 150–400)
RBC: 3.96 MIL/uL — ABNORMAL LOW (ref 4.22–5.81)
RDW: 17.6 % — AB (ref 11.5–15.5)
WBC: 6.1 10*3/uL (ref 4.0–10.5)

## 2014-08-09 LAB — URINE CULTURE
Colony Count: NO GROWTH
Culture: NO GROWTH

## 2014-08-09 MED ORDER — PREDNISONE 50 MG PO TABS
60.0000 mg | ORAL_TABLET | Freq: Every day | ORAL | Status: DC
  Administered 2014-08-09: 60 mg via ORAL
  Filled 2014-08-09 (×2): qty 1

## 2014-08-09 MED ORDER — ALBUTEROL SULFATE HFA 108 (90 BASE) MCG/ACT IN AERS: 2.0000 | INHALATION_SPRAY | Freq: Four times a day (QID) | RESPIRATORY_TRACT | Status: DC | PRN

## 2014-08-09 MED ORDER — ALBUTEROL SULFATE (2.5 MG/3ML) 0.083% IN NEBU
INHALATION_SOLUTION | RESPIRATORY_TRACT | Status: AC
  Administered 2014-08-09: 05:00:00
  Filled 2014-08-09: qty 3

## 2014-08-09 MED ORDER — OSELTAMIVIR PHOSPHATE 30 MG PO CAPS: 30.0000 mg | ORAL_CAPSULE | Freq: Two times a day (BID) | ORAL | Status: DC

## 2014-08-09 MED ORDER — ALBUTEROL SULFATE (2.5 MG/3ML) 0.083% IN NEBU
2.5000 mg | INHALATION_SOLUTION | RESPIRATORY_TRACT | Status: DC | PRN
  Administered 2014-08-09: 2.5 mg via RESPIRATORY_TRACT
  Filled 2014-08-09: qty 3

## 2014-08-09 MED ORDER — PREDNISONE 20 MG PO TABS: 60.0000 mg | ORAL_TABLET | Freq: Every day | ORAL | Status: DC

## 2014-08-09 MED ORDER — LOPERAMIDE HCL 2 MG PO CAPS: 2.0000 mg | ORAL_CAPSULE | ORAL | Status: DC | PRN

## 2014-08-09 NOTE — Progress Notes (Signed)
Physical Therapy Treatment Patient Details Name: Corey Larson. MRN: [id number] DOB: 1927-06-05 Today's Date: 08/09/2014    History of Present Illness Pt is an 81 y.o. Male admitted with the flu (H1N1) with symptoms of generalized weakness, cough, URI symptoms, muscle aches, body aches as well as a fever.    PT Comments    Patient progressing well towards PT goals, ambulating up to 185 feet today, demonstrating improved balance with the use of a rolling walker for support. Safely completed stair, transfer, and bed mobility training with family present, answering all questions/concerns. All in agreement that HHPT would be more appropriate at this time vs OP PT due to pt weakness and difficulty rising from a low surface (i.e. Car). Feel he is adequate for d/c from a mobility standpoint when medically ready.  Follow Up Recommendations  Home health PT;Supervision for mobility/OOB     Equipment Recommendations  Rolling walker with 5" wheels    Recommendations for Other Services       Precautions / Restrictions Precautions Precautions: Fall Precaution Comments: fall risk present, but minimal; will likely be much improved as he gets better Restrictions Weight Bearing Restrictions: No    Mobility  Bed Mobility Overal bed mobility: Needs Assistance Bed Mobility: Supine to Sit;Sit to Supine     Supine to sit: Min guard Sit to supine: Min guard   General bed mobility comments: Min guard for safety. Some difficulty brining LEs onto bed, rocks for momentum to rise. Did not require physical assist for this task today.  Transfers Overall transfer level: Needs assistance Equipment used: Rolling walker (2 wheeled) Transfers: Sit to/from Stand Sit to Stand: Min guard;From elevated surface         General transfer comment: Min guard for safety with cues for hand placement. Performed from bed surface similar to height at home, and recliner without physical  assist.  Ambulation/Gait Ambulation/Gait assistance: Min guard Ambulation Distance (Feet): 185 Feet Assistive device: Rolling walker (2 wheeled) Gait Pattern/deviations: Step-through pattern;Shuffle;Decreased stride length Gait velocity: decreased   General Gait Details: Cues for upright posture and walker placement for proximity. Min guard for safety with intermittent cues to increase foot clearance due to shuffle that became more prevelant with fatigue towards end of distance. No loss of balance noted during bout, no buckling of LEs.   Stairs Stairs: Yes Stairs assistance: Min guard Stair Management: One rail Left;Step to pattern;Sideways;Alternating pattern Number of Stairs: 12 General stair comments: Educated on safe stair navigation techniques. Cues for use of rail similar to home environment and sideways approach sequencing. Close guard for safety but did not need physical assist.  Wheelchair Mobility    Modified Rankin (Stroke Patients Only)       Balance                                    Cognition Arousal/Alertness: Awake/alert Behavior During Therapy: WFL for tasks assessed/performed Overall Cognitive Status: Within Functional Limits for tasks assessed                      Exercises      General Comments General comments (skin integrity, edema, etc.): Wife was present during thearpy session. All questions answered. Pt states he feels more confident with RW for support. Wife agrees pt would benefit from HHPT vs OP PT due to weakness and is worried about him getting in/out of a car  until his strength improves.      Pertinent Vitals/Pain Pain Assessment: No/denies pain    Home Living                      Prior Function            PT Goals (current goals can now be found in the care plan section) Acute Rehab PT Goals PT Goal Formulation: With patient Time For Goal Achievement: 08/22/14 Potential to Achieve Goals:  Good Progress towards PT goals: Progressing toward goals    Frequency  Min 3X/week    PT Plan Discharge plan needs to be updated    Co-evaluation             End of Session   Activity Tolerance: Patient tolerated treatment well Patient left: in chair;with call bell/phone within reach     Time: 8891-6945 PT Time Calculation (min) (ACUTE ONLY): 24 min  Charges:  $Gait Training: 8-22 mins $Therapeutic Activity: 8-22 mins                    G Codes:      Ellouise Newer September 06, 2014, 5:04 PM Camille Bal [place], [place]

## 2014-08-09 NOTE — Progress Notes (Signed)
Patient wheezzing denies sob, and sats 99%, breathing treatment order and given by RT

## 2014-08-09 NOTE — Progress Notes (Signed)
NURSING PROGRESS NOTE  Corey Larson [id number] Discharge Data: 08/09/2014 5:10 PM Attending Provider: Donne Hazel, MD CBJ:[email or web address],HYWVP, MD     [name] to be D/C'd Home with home health PT per MD order.  Discussed with the patient the After Visit Summary and all questions fully answered. All IV's discontinued with no bleeding noted. All belongings returned to patient for patient to take home.   Last Vital Signs:  Blood pressure 126/54, pulse 109, temperature 99.7 F (37.6 C), temperature source Oral, resp. rate 16, height 5' 6.5" (1.689 m), weight 79.6 kg (175 lb 7.8 oz), SpO2 98 %.  Discharge Medication List   Medication List    STOP taking these medications        tadalafil 20 MG tablet  Commonly known as:  CIALIS      TAKE these medications        acetaminophen 500 MG tablet  Commonly known as:  TYLENOL  Take 1,500 mg by mouth daily as needed for moderate pain.     albuterol 108 (90 BASE) MCG/ACT inhaler  Commonly known as:  PROVENTIL HFA;VENTOLIN HFA  Inhale 2 puffs into the lungs every 6 (six) hours as needed for wheezing or shortness of breath.     amLODipine 2.5 MG tablet  Commonly known as:  NORVASC  Take 1 tablet by mouth daily.     aspirin 81 MG tablet  Take 81 mg by mouth daily.     B-complex with vitamin C tablet  Take 1 tablet by mouth daily.     chlordiazePOXIDE 25 MG capsule  Commonly known as:  LIBRIUM  Take 25 mg by mouth as needed (for sleep).     clopidogrel 75 MG tablet  Commonly known as:  PLAVIX  Take 1 tablet (75 mg total) by mouth daily with breakfast.     fluticasone 50 MCG/ACT nasal spray  Commonly known as:  FLONASE  Place 1 spray into both nostrils as needed for allergies or rhinitis.     gabapentin 800 MG tablet  Commonly known as:  NEURONTIN  Take 800 mg by mouth 3 (three) times daily.     guaifenesin 100 MG/5ML syrup  Commonly known as:  ROBITUSSIN  Take 200 mg by mouth 3 (three) times daily as needed  for cough or congestion.     loperamide 2 MG capsule  Commonly known as:  IMODIUM  Take 1 capsule (2 mg total) by mouth as needed for diarrhea or loose stools.     nebivolol 10 MG tablet  Commonly known as:  BYSTOLIC  Take 5 mg by mouth at bedtime. Takes 1/2 tablet     nitroGLYCERIN 0.4 MG SL tablet  Commonly known as:  NITROSTAT  Place 1 tablet (0.4 mg total) under the tongue every 5 (five) minutes x 3 doses as needed for chest pain.     oseltamivir 30 MG capsule  Commonly known as:  TAMIFLU  Take 1 capsule (30 mg total) by mouth 2 (two) times daily.     predniSONE 20 MG tablet  Commonly known as:  DELTASONE  Take 3 tablets (60 mg total) by mouth daily with breakfast.     simvastatin 40 MG tablet  Commonly known as:  ZOCOR  Take 20 mg by mouth at bedtime.         [name], RN

## 2014-08-09 NOTE — Discharge Summary (Signed)
Physician Discharge Summary  Corey Larson. JJK:[id number] DOB: 1928/02/25 DOA: 08/07/2014  PCP: Thressa Sheller, MD  Admit date: 08/07/2014 Discharge date: 08/09/2014  Time spent: 25 minutes  Recommendations for Outpatient Follow-up:  1. Follow up with PCP in 1-2 weeks  Discharge Diagnoses:  Principal Problem:   Influenza A (H1N1) Active Problems:   CAD (coronary artery disease), Hx of LAD DES in 2005   Peripheral neuropathy   Hyperlipidemia with target LDL less than 70   Discharge Condition: Improved  Diet recommendation: Heart healthy  Filed Weights   08/07/14 2315  Weight: 79.6 kg (175 lb 7.8 oz)    History of present illness:  Please see admit h and p from 3/5 for details. Briefly, pt presented with cough, URI symptoms, found to be flu pos. Pt admitted for further work up.  Hospital Course:  1. Influenza A, H1N1 1. Pt is s/p flu vaccine this past year 2. On Tamiflu, to continue on discharge 3. Have advised close contacts to notify their PCP for consideration of prophylactic Tamiflu 4. Overall feels improved 5. Continue supportive care 6. Patient remained stable on minimal O2 support 7. He will be discharged with prednisone taper with albuterol MDI PRN 2. HTN 1. BP stable 2. Cont amlodipine and bystolic per home regimen 3. CAD 1. Stable 2. Denies chest pain or sob 4. HLD 1. Cont statin per home regimen 5. Fevers 1. Secondary to above 2. Continue tylenol PRN 6. Diarrhea 1. Suspect secondary to flu 2. No recent abx 3. Imodium PRN 7. DVT prophylaxis 1. Heparin subQ   Consultations:  none  Discharge Exam: Filed Vitals:   08/09/14 0627 08/09/14 1012 08/09/14 1104 08/09/14 1553  BP: 137/66   126/54  Pulse:      Temp:    99.7 F (37.6 C)  TempSrc:    Oral  Resp:    16  Height:      Weight:      SpO2:  100% 97% 98%    General: awake, in nad Cardiovascular: regular, s1, s2 Respiratory: normal resp effort, no wheezing  Discharge  Instructions     Medication List    STOP taking these medications        tadalafil 20 MG tablet  Commonly known as:  CIALIS      TAKE these medications        acetaminophen 500 MG tablet  Commonly known as:  TYLENOL  Take 1,500 mg by mouth daily as needed for moderate pain.     albuterol 108 (90 BASE) MCG/ACT inhaler  Commonly known as:  PROVENTIL HFA;VENTOLIN HFA  Inhale 2 puffs into the lungs every 6 (six) hours as needed for wheezing or shortness of breath.     amLODipine 2.5 MG tablet  Commonly known as:  NORVASC  Take 1 tablet by mouth daily.     aspirin 81 MG tablet  Take 81 mg by mouth daily.     B-complex with vitamin C tablet  Take 1 tablet by mouth daily.     chlordiazePOXIDE 25 MG capsule  Commonly known as:  LIBRIUM  Take 25 mg by mouth as needed (for sleep).     clopidogrel 75 MG tablet  Commonly known as:  PLAVIX  Take 1 tablet (75 mg total) by mouth daily with breakfast.     fluticasone 50 MCG/ACT nasal spray  Commonly known as:  FLONASE  Place 1 spray into both nostrils as needed for allergies or rhinitis.     gabapentin  800 MG tablet  Commonly known as:  NEURONTIN  Take 800 mg by mouth 3 (three) times daily.     guaifenesin 100 MG/5ML syrup  Commonly known as:  ROBITUSSIN  Take 200 mg by mouth 3 (three) times daily as needed for cough or congestion.     loperamide 2 MG capsule  Commonly known as:  IMODIUM  Take 1 capsule (2 mg total) by mouth as needed for diarrhea or loose stools.     nebivolol 10 MG tablet  Commonly known as:  BYSTOLIC  Take 5 mg by mouth at bedtime. Takes 1/2 tablet     nitroGLYCERIN 0.4 MG SL tablet  Commonly known as:  NITROSTAT  Place 1 tablet (0.4 mg total) under the tongue every 5 (five) minutes x 3 doses as needed for chest pain.     oseltamivir 30 MG capsule  Commonly known as:  TAMIFLU  Take 1 capsule (30 mg total) by mouth 2 (two) times daily.     predniSONE 20 MG tablet  Commonly known as:  DELTASONE   Take 3 tablets (60 mg total) by mouth daily with breakfast.     simvastatin 40 MG tablet  Commonly known as:  ZOCOR  Take 20 mg by mouth at bedtime.       Allergies  Allergen Reactions  . Klonopin [Clonazepam] Other (See Comments)    Sleep walking episodes   Follow-up Information    Follow up with Thressa Sheller, MD. Schedule an appointment as soon as possible for a visit in 1 week.   Specialty:  Internal Medicine   Contact information:   [place], [place] [address] [phone]        The results of significant diagnostics from this hospitalization (including imaging, microbiology, ancillary and laboratory) are listed below for reference.    Significant Diagnostic Studies: Dg Chest 2 View  08/07/2014   CLINICAL DATA:  Fevers, headache. Weakness and congestion since Wednesday. Ex-smoker.  EXAM: CHEST  2 VIEW  COMPARISON:  04/15/2013  FINDINGS: Hyperinflation. Patient rotated right. Midline trachea. Mild cardiomegaly. Tortuous thoracic aorta. No pleural effusion or pneumothorax. No congestive failure. Mild right base volume loss.  IMPRESSION: Hyperinflation cardiomegaly.  No acute superimposed process.   Electronically Signed   By: Abigail Miyamoto M.D.   On: 08/07/2014 19:03    Microbiology: Recent Results (from the past 240 hour(s))  Culture, blood (routine x 2)     Status: None (Preliminary result)   Collection Time: 08/07/14  5:35 PM  Result Value Ref Range Status   Specimen Description BLOOD RIGHT FOREARM  Final   Special Requests BOTTLES DRAWN AEROBIC AND ANAEROBIC 5CC  Final   Culture   Final           BLOOD CULTURE RECEIVED NO GROWTH TO DATE CULTURE WILL BE HELD FOR 5 DAYS BEFORE ISSUING A FINAL NEGATIVE REPORT Performed at [facility]    Report Status PENDING  Incomplete  Culture, blood (routine x 2)     Status: None (Preliminary result)   Collection Time: 08/07/14  5:35 PM  Result Value Ref Range Status   Specimen Description  BLOOD LEFT ARM  Final   Special Requests BOTTLES DRAWN AEROBIC AND ANAEROBIC 5 CC  Final   Culture   Final           BLOOD CULTURE RECEIVED NO GROWTH TO DATE CULTURE WILL BE HELD FOR 5 DAYS BEFORE ISSUING A FINAL NEGATIVE REPORT Performed at [facility]  Report Status PENDING  Incomplete  Urine culture     Status: None   Collection Time: 08/07/14  6:33 PM  Result Value Ref Range Status   Specimen Description URINE, CATHETERIZED  Final   Special Requests NONE  Final   Colony Count NO GROWTH Performed at [facility]   Final   Culture NO GROWTH Performed at [facility]   Final   Report Status 08/09/2014 FINAL  Final     Labs: Basic Metabolic Panel:  Recent Labs Lab 08/07/14 1735 08/08/14 0537 08/09/14 0748  NA 135 138 137  K 4.2 3.9 3.9  CL 107 109 112  CO2 19 22 19   GLUCOSE 112* 105* 103*  BUN 23 17 14   CREATININE 1.61* 1.26 1.07  CALCIUM 8.2* 8.0* 7.8*   Liver Function Tests: No results for input(s): AST, ALT, ALKPHOS, BILITOT, PROT, ALBUMIN in the last 168 hours. No results for input(s): LIPASE, AMYLASE in the last 168 hours. No results for input(s): AMMONIA in the last 168 hours. CBC:  Recent Labs Lab 08/07/14 1735 08/08/14 0537 08/09/14 0748  WBC 6.7 5.7 6.1  HGB 10.2* 10.0* 9.9*  HCT 32.1* 31.7* 31.3*  MCV 79.5 80.5 79.0  PLT 145* 130* 129*   Cardiac Enzymes: No results for input(s): CKTOTAL, CKMB, CKMBINDEX, TROPONINI in the last 168 hours. BNP: BNP (last 3 results) No results for input(s): BNP in the last 8760 hours.  ProBNP (last 3 results) No results for input(s): PROBNP in the last 8760 hours.  CBG: No results for input(s): GLUCAP in the last 168 hours.   Signed:  CHIU, STEPHEN K  [facility] 08/09/2014, 5:00 PM

## 2014-08-11 ENCOUNTER — Other Ambulatory Visit: Payer: Self-pay | Admitting: Cardiovascular Disease

## 2014-08-11 NOTE — Care Management Note (Signed)
    Page 1 of 1   08/11/2014     10:34:12 AM CARE MANAGEMENT NOTE 08/11/2014  Patient:  Corey Larson, Corey Larson   Account Number:  [id number]  Date Initiated:  08/11/2014  Documentation initiated by:  Tomi Bamberger  Subjective/Objective Assessment:   admit- lives with spouse.     Action/Plan:   Anticipated DC Date:  08/09/2014   Anticipated DC Plan:  [place]  CM consult      [facility] Choice  HOME HEALTH   Choice offered to / List presented to:  C-3 Spouse        HH arranged  HH-2 PT      [place].   Status of service:  Completed, signed off Medicare Important Message given?  NA - LOS <3 / Initial given by admissions (If response is "NO", the following Medicare IM given date fields will be blank) Date Medicare IM given:   Medicare IM given by:   Date Additional Medicare IM given:   Additional Medicare IM given by:    Discharge Disposition:  [place]  Per UR Regulation:  Reviewed for med. necessity/level of care/duration of stay  If discussed at [place] of Stay Meetings, dates discussed:    Comments:  08/11/14 Ethan ,BSN [phone] patient dc home ,NCM contacted patient at home, spouse chose [facility] for hhpt, referral made to [facility].

## 2014-08-11 NOTE — Telephone Encounter (Signed)
Rx(s) sent to pharmacy electronically.  

## 2014-08-13 DIAGNOSIS — J09X2 Influenza due to identified novel influenza A virus with other respiratory manifestations: Secondary | ICD-10-CM | POA: Diagnosis not present

## 2014-08-13 DIAGNOSIS — I2510 Atherosclerotic heart disease of native coronary artery without angina pectoris: Secondary | ICD-10-CM | POA: Diagnosis not present

## 2014-08-13 DIAGNOSIS — H3530 Unspecified macular degeneration: Secondary | ICD-10-CM | POA: Diagnosis not present

## 2014-08-13 DIAGNOSIS — G4733 Obstructive sleep apnea (adult) (pediatric): Secondary | ICD-10-CM | POA: Diagnosis not present

## 2014-08-13 DIAGNOSIS — N189 Chronic kidney disease, unspecified: Secondary | ICD-10-CM | POA: Diagnosis not present

## 2014-08-13 DIAGNOSIS — G629 Polyneuropathy, unspecified: Secondary | ICD-10-CM | POA: Diagnosis not present

## 2014-08-13 DIAGNOSIS — M6281 Muscle weakness (generalized): Secondary | ICD-10-CM | POA: Diagnosis not present

## 2014-08-13 DIAGNOSIS — R05 Cough: Secondary | ICD-10-CM | POA: Diagnosis not present

## 2014-08-13 DIAGNOSIS — I129 Hypertensive chronic kidney disease with stage 1 through stage 4 chronic kidney disease, or unspecified chronic kidney disease: Secondary | ICD-10-CM | POA: Diagnosis not present

## 2014-08-14 LAB — CULTURE, BLOOD (ROUTINE X 2)
CULTURE: NO GROWTH
Culture: NO GROWTH

## 2014-08-16 DIAGNOSIS — R05 Cough: Secondary | ICD-10-CM | POA: Diagnosis not present

## 2014-08-16 DIAGNOSIS — N189 Chronic kidney disease, unspecified: Secondary | ICD-10-CM | POA: Diagnosis not present

## 2014-08-16 DIAGNOSIS — I129 Hypertensive chronic kidney disease with stage 1 through stage 4 chronic kidney disease, or unspecified chronic kidney disease: Secondary | ICD-10-CM | POA: Diagnosis not present

## 2014-08-16 DIAGNOSIS — I2510 Atherosclerotic heart disease of native coronary artery without angina pectoris: Secondary | ICD-10-CM | POA: Diagnosis not present

## 2014-08-16 DIAGNOSIS — M6281 Muscle weakness (generalized): Secondary | ICD-10-CM | POA: Diagnosis not present

## 2014-08-16 DIAGNOSIS — J09X2 Influenza due to identified novel influenza A virus with other respiratory manifestations: Secondary | ICD-10-CM | POA: Diagnosis not present

## 2014-08-17 DIAGNOSIS — J189 Pneumonia, unspecified organism: Secondary | ICD-10-CM | POA: Diagnosis not present

## 2014-08-17 DIAGNOSIS — J209 Acute bronchitis, unspecified: Secondary | ICD-10-CM | POA: Diagnosis not present

## 2014-08-18 DIAGNOSIS — I2510 Atherosclerotic heart disease of native coronary artery without angina pectoris: Secondary | ICD-10-CM | POA: Diagnosis not present

## 2014-08-18 DIAGNOSIS — J09X2 Influenza due to identified novel influenza A virus with other respiratory manifestations: Secondary | ICD-10-CM | POA: Diagnosis not present

## 2014-08-18 DIAGNOSIS — N189 Chronic kidney disease, unspecified: Secondary | ICD-10-CM | POA: Diagnosis not present

## 2014-08-18 DIAGNOSIS — R05 Cough: Secondary | ICD-10-CM | POA: Diagnosis not present

## 2014-08-18 DIAGNOSIS — I129 Hypertensive chronic kidney disease with stage 1 through stage 4 chronic kidney disease, or unspecified chronic kidney disease: Secondary | ICD-10-CM | POA: Diagnosis not present

## 2014-08-18 DIAGNOSIS — M6281 Muscle weakness (generalized): Secondary | ICD-10-CM | POA: Diagnosis not present

## 2014-08-20 DIAGNOSIS — J09X2 Influenza due to identified novel influenza A virus with other respiratory manifestations: Secondary | ICD-10-CM | POA: Diagnosis not present

## 2014-08-20 DIAGNOSIS — I2510 Atherosclerotic heart disease of native coronary artery without angina pectoris: Secondary | ICD-10-CM | POA: Diagnosis not present

## 2014-08-20 DIAGNOSIS — M6281 Muscle weakness (generalized): Secondary | ICD-10-CM | POA: Diagnosis not present

## 2014-08-20 DIAGNOSIS — R05 Cough: Secondary | ICD-10-CM | POA: Diagnosis not present

## 2014-08-20 DIAGNOSIS — N189 Chronic kidney disease, unspecified: Secondary | ICD-10-CM | POA: Diagnosis not present

## 2014-08-20 DIAGNOSIS — I129 Hypertensive chronic kidney disease with stage 1 through stage 4 chronic kidney disease, or unspecified chronic kidney disease: Secondary | ICD-10-CM | POA: Diagnosis not present

## 2014-08-23 DIAGNOSIS — I2510 Atherosclerotic heart disease of native coronary artery without angina pectoris: Secondary | ICD-10-CM | POA: Diagnosis not present

## 2014-08-23 DIAGNOSIS — R05 Cough: Secondary | ICD-10-CM | POA: Diagnosis not present

## 2014-08-23 DIAGNOSIS — N189 Chronic kidney disease, unspecified: Secondary | ICD-10-CM | POA: Diagnosis not present

## 2014-08-23 DIAGNOSIS — M6281 Muscle weakness (generalized): Secondary | ICD-10-CM | POA: Diagnosis not present

## 2014-08-23 DIAGNOSIS — I129 Hypertensive chronic kidney disease with stage 1 through stage 4 chronic kidney disease, or unspecified chronic kidney disease: Secondary | ICD-10-CM | POA: Diagnosis not present

## 2014-08-23 DIAGNOSIS — J09X2 Influenza due to identified novel influenza A virus with other respiratory manifestations: Secondary | ICD-10-CM | POA: Diagnosis not present

## 2014-09-11 DIAGNOSIS — I2510 Atherosclerotic heart disease of native coronary artery without angina pectoris: Secondary | ICD-10-CM | POA: Diagnosis not present

## 2014-09-11 DIAGNOSIS — M6281 Muscle weakness (generalized): Secondary | ICD-10-CM | POA: Diagnosis not present

## 2014-09-11 DIAGNOSIS — I129 Hypertensive chronic kidney disease with stage 1 through stage 4 chronic kidney disease, or unspecified chronic kidney disease: Secondary | ICD-10-CM | POA: Diagnosis not present

## 2014-09-11 DIAGNOSIS — J09X2 Influenza due to identified novel influenza A virus with other respiratory manifestations: Secondary | ICD-10-CM | POA: Diagnosis not present

## 2014-09-11 DIAGNOSIS — N189 Chronic kidney disease, unspecified: Secondary | ICD-10-CM | POA: Diagnosis not present

## 2014-09-11 DIAGNOSIS — R05 Cough: Secondary | ICD-10-CM | POA: Diagnosis not present

## 2014-09-16 DIAGNOSIS — H3531 Nonexudative age-related macular degeneration: Secondary | ICD-10-CM | POA: Diagnosis not present

## 2014-09-16 DIAGNOSIS — H04123 Dry eye syndrome of bilateral lacrimal glands: Secondary | ICD-10-CM | POA: Diagnosis not present

## 2014-09-16 DIAGNOSIS — Z961 Presence of intraocular lens: Secondary | ICD-10-CM | POA: Diagnosis not present

## 2014-09-16 DIAGNOSIS — H16103 Unspecified superficial keratitis, bilateral: Secondary | ICD-10-CM | POA: Diagnosis not present

## 2014-09-21 DIAGNOSIS — H903 Sensorineural hearing loss, bilateral: Secondary | ICD-10-CM | POA: Diagnosis not present

## 2014-10-01 DIAGNOSIS — R05 Cough: Secondary | ICD-10-CM | POA: Diagnosis not present

## 2014-10-01 DIAGNOSIS — I2510 Atherosclerotic heart disease of native coronary artery without angina pectoris: Secondary | ICD-10-CM | POA: Diagnosis not present

## 2014-10-01 DIAGNOSIS — M6281 Muscle weakness (generalized): Secondary | ICD-10-CM | POA: Diagnosis not present

## 2014-10-07 DIAGNOSIS — D485 Neoplasm of uncertain behavior of skin: Secondary | ICD-10-CM | POA: Diagnosis not present

## 2014-10-07 DIAGNOSIS — L570 Actinic keratosis: Secondary | ICD-10-CM | POA: Diagnosis not present

## 2014-10-07 DIAGNOSIS — Z85828 Personal history of other malignant neoplasm of skin: Secondary | ICD-10-CM | POA: Diagnosis not present

## 2014-10-07 DIAGNOSIS — D1801 Hemangioma of skin and subcutaneous tissue: Secondary | ICD-10-CM | POA: Diagnosis not present

## 2014-10-07 DIAGNOSIS — C44719 Basal cell carcinoma of skin of left lower limb, including hip: Secondary | ICD-10-CM | POA: Diagnosis not present

## 2014-10-07 DIAGNOSIS — L821 Other seborrheic keratosis: Secondary | ICD-10-CM | POA: Diagnosis not present

## 2014-10-07 DIAGNOSIS — C44619 Basal cell carcinoma of skin of left upper limb, including shoulder: Secondary | ICD-10-CM | POA: Diagnosis not present

## 2014-10-20 DIAGNOSIS — M1711 Unilateral primary osteoarthritis, right knee: Secondary | ICD-10-CM | POA: Diagnosis not present

## 2014-11-11 DIAGNOSIS — G4700 Insomnia, unspecified: Secondary | ICD-10-CM | POA: Diagnosis not present

## 2014-11-24 ENCOUNTER — Encounter: Payer: Self-pay | Admitting: *Deleted

## 2014-11-30 ENCOUNTER — Encounter: Payer: Self-pay | Admitting: Cardiovascular Disease

## 2014-12-09 DIAGNOSIS — H3531 Nonexudative age-related macular degeneration: Secondary | ICD-10-CM | POA: Diagnosis not present

## 2014-12-09 DIAGNOSIS — H3532 Exudative age-related macular degeneration: Secondary | ICD-10-CM | POA: Diagnosis not present

## 2015-01-25 DIAGNOSIS — E784 Other hyperlipidemia: Secondary | ICD-10-CM | POA: Diagnosis not present

## 2015-01-25 DIAGNOSIS — E559 Vitamin D deficiency, unspecified: Secondary | ICD-10-CM | POA: Diagnosis not present

## 2015-01-25 DIAGNOSIS — I10 Essential (primary) hypertension: Secondary | ICD-10-CM | POA: Diagnosis not present

## 2015-01-25 DIAGNOSIS — M109 Gout, unspecified: Secondary | ICD-10-CM | POA: Diagnosis not present

## 2015-02-02 DIAGNOSIS — Z23 Encounter for immunization: Secondary | ICD-10-CM | POA: Diagnosis not present

## 2015-02-02 DIAGNOSIS — R293 Abnormal posture: Secondary | ICD-10-CM | POA: Diagnosis not present

## 2015-02-02 DIAGNOSIS — M549 Dorsalgia, unspecified: Secondary | ICD-10-CM | POA: Diagnosis not present

## 2015-02-02 DIAGNOSIS — G8929 Other chronic pain: Secondary | ICD-10-CM | POA: Diagnosis not present

## 2015-02-22 DIAGNOSIS — L089 Local infection of the skin and subcutaneous tissue, unspecified: Secondary | ICD-10-CM | POA: Diagnosis not present

## 2015-03-24 ENCOUNTER — Ambulatory Visit: Payer: Medicare Other | Attending: Internal Medicine | Admitting: Physical Therapy

## 2015-03-24 DIAGNOSIS — G8929 Other chronic pain: Secondary | ICD-10-CM

## 2015-03-24 DIAGNOSIS — R531 Weakness: Secondary | ICD-10-CM | POA: Diagnosis not present

## 2015-03-24 DIAGNOSIS — M545 Low back pain: Secondary | ICD-10-CM | POA: Insufficient documentation

## 2015-03-24 NOTE — Patient Instructions (Signed)
   Lower Trunk Rotation Stretch   Keeping back flat and feet together, rotate knees to left side. Hold ___30_ seconds. Repeat __3_ times per set. . Do __2__ sessions per day.  http://orth.exer.us/122   Copyright  VHI. All rights reserved.    Hamstring Step 2   Left foot relaxed, knee straight, other leg bent, foot flat. Raise straight leg further upward to maximal range. Hold _30__ seconds. Relax leg completely down. Repeat ___ times.  Copyright  VHI. All rights reserved.   Knee to Chest (Flexion)   Pull knee toward chest. Feel stretch in lower back or buttock area. Breathing deeply, Hold ____ seconds. Repeat with other knee. Repeat ____ times. Do ____ sessions per day.  Isometric Abdominal   Lying on back with knees bent, tighten stomach by pressing elbows down. Hold ____ seconds. Repeat ____ times per set. Do ____ sets per session. Do ____ sessions per day.   Hip Stretch  Put right ankle over left knee. Let right knee fall downward, but keep ankle in place. Feel the stretch in hip. May push down gently with hand to feel stretch. Hold ____ seconds while counting out loud. Repeat with other leg. Repeat ____ times. Do ____ sessions per day.  opyright  VHI. All rights reserved.  [facility] [address], [place], [place] [address] Phone # [phone] Fax [phone]

## 2015-03-24 NOTE — Therapy (Signed)
[facility] [address] [place], [place], [address] Phone: [phone]   Fax:  [phone]  Physical Therapy Evaluation  Patient Details  Name: Corey Larson. MRN: [id number] Date of Birth: [birth year] Referring Provider: Noah Delaine  Encounter Date: 03/24/2015      PT End of Session - 03/24/15 1048    Visit Number 1   Date for PT Re-Evaluation 05/19/15   PT Start Time 4383   PT Stop Time 1055   PT Time Calculation (min) 32 min   Activity Tolerance Patient tolerated treatment well   Behavior During Therapy [facility] for tasks assessed/performed      Past Medical History  Diagnosis Date  . CAD (coronary artery disease) 2005    stent to LAD by Dr. Glade Lloyd. mild irregulatities of RCA.    . OSA (obstructive sleep apnea)     mild, never on cpap  . Hyperlipidemia LDL goal < 70   . H/O exercise stress test 05/2012    no statistically significant ischemia.   . Peripheral neuropathy ([place])   . Macular degeneration     bilateral  . Hypertension   . CRF (chronic renal failure)   . Insomnia   . ED (erectile dysfunction)   . Vitamin D deficiency   . Abnormality of gait 11/05/2013  . History of TIA (transient ischemic attack)     transient confusion  . Macular degeneration     Right>left  . HOH (hard of hearing)     hearing aids    Past Surgical History  Procedure Laterality Date  . Knee surgery      right knee 2011   . Ankle surgery      right ankle 2002 fracture  . Back surgery  1976    laminectomy  . Hernia repair  1981    "double hernia"  . Cataract extraction      bilateral  . Left heart catheterization with coronary angiogram N/A 04/16/2013    Procedure: LEFT HEART CATHETERIZATION WITH CORONARY ANGIOGRAM;  Surgeon: Leonie Man, MD;  Location: [facility] CATH LAB;  Service: Cardiovascular;  Laterality: N/A;  . Transthoracic echocardiogram  05/12/2010    EF =>55%. MILD CONCENTRIC LVH. TRACE MITRAL REGURG.   . Nm myocar multiple w/spect  05/13/2012    MILD UPPER SEPTAL THINNING AND DIAPHRAGMATIC ATTENUATION W/O SIGNIFICANT ISCHEMIA. EF 66%.  . Cardiac catheterization      2005, stent placed DES LAD  . Coronary angioplasty      There were no vitals filed for this visit.  Visit Diagnosis:  Weakness generalized - Plan: PT plan of care cert/re-cert  Chronic bilateral low back pain without sciatica - Plan: PT plan of care cert/re-cert      Subjective Assessment - 03/24/15 1025    Subjective Pt has had back pain for 30-40 years, surgery for ruptured disc 40 years ago. Pt has been exercising regularly but still gets pain when he over exerts himself.  Pt has gotten shots in back from orthopedic surgeon, last shot 6 months ago.     Pertinent History peripheral neuropathy   Limitations Standing   How long can you stand comfortably? 10 minutes   Patient Stated Goals decrease pain and stiffness, be able to work in yard and stand without symptoms   Currently in Pain? No/denies            [facility] PT Assessment - 03/24/15 0001    Assessment   Medical Diagnosis low back ache  Referring Provider [facility]   Precautions   Precautions None   Restrictions   Weight Bearing Restrictions No   Balance Screen   Has the patient fallen in the past 6 months Yes   Has the patient had a decrease in activity level because of a fear of falling?  Yes   [place] Private residence   Living Arrangements Spouse/significant other   Prior Function   Level of [place] Retired   Associate Professor   Overall Cognitive Status Within Functional Limits for tasks assessed   Observation/Other Assessments   Observations Thoracic kyphosis, pelvis in Rt rotation, Rt hip lower than Lt   Focus on Therapeutic Outcomes (FOTO)  42%   Sensation   Light Touch --  neuropathy   ROM / Strength   AROM / PROM / Strength Strength   Strength   Overall Strength Comments Bilat hips  3/5, hip abd 3/5, hip ext 3-/5   Flexibility   Soft Tissue Assessment /Muscle Length --  decreased HS and quad length bilat   Palpation   Palpation comment no tenderness to palpation lumbar spine or sacrum                   [facility] Adult PT Treatment/Exercise - 03/24/15 0001    Exercises   Exercises Lumbar   Lumbar Exercises: Stretches   Active Hamstring Stretch 1 rep;30 seconds   Single Knee to Chest Stretch 1 rep;30 seconds   Lower Trunk Rotation 1 rep;30 seconds   Pelvic Tilt 5 reps;10 seconds   Piriformis Stretch 1 rep;30 seconds  seated                PT Education - 03/24/15 1048    Education provided Yes   Education Details HEP, PT POC   Person(s) Educated Patient   Methods Explanation;Demonstration;Handout   Comprehension Verbalized understanding;Returned demonstration          PT Short Term Goals - 03/24/15 1054    PT SHORT TERM GOAL #1   Title Pt will be independent in initial HEP   Time 4   Period Weeks   Status New   PT SHORT TERM GOAL #2   Title Pt will improve hip strength to 4/5 all directions   Time 4   Period Weeks   Status New   PT SHORT TERM GOAL #3   Title Pt will tolerate standing x 15 minutes withotu increased pain   Time 4   Period Weeks   Status New           PT Long Term Goals - 03/24/15 1055    PT LONG TERM GOAL #1   Title Pt will improve FOTO to < = 40% to demo improved functional mobility   Time 8   Period Weeks   Status New   PT LONG TERM GOAL #2   Title Pt will improve hip strength to 4+/5 all directions   Time 8   Period Weeks   Status New   PT LONG TERM GOAL #3   Title Pt will be indepents with advanced HEP   Time 8   Period Weeks   Status New   PT LONG TERM GOAL #4   Title Pt will tolerate standing x 20 minutes without increased pain   Time 8   Period Weeks   Status New               Plan - 03/24/15 1052  Clinical Impression Statement Pt presents wtih postural deformity, pain,  weakness and decreased flexibility limiting his abiliyt to particpate in standing and leisure tasks. Pt will benefit from skilled PT to address deficits in increase funcitonal mobility.   Pt will benefit from skilled therapeutic intervention in order to improve on the following deficits Decreased endurance;Decreased range of motion;Pain;Increased fascial restricitons;Impaired sensation;Impaired flexibility;Difficulty walking;Decreased strength;Decreased activity tolerance;Postural dysfunction   Rehab Potential Good   PT Frequency 2x / week   PT Duration 8 weeks   PT Treatment/Interventions ADLs/Self Care Home Management;Cryotherapy;Electrical Stimulation;Iontophoresis 4mg /ml Dexamethasone;Moist Heat;Taping;Patient/family education;Neuromuscular re-education;Therapeutic exercise;Therapeutic activities;Functional mobility training;Manual techniques;Passive range of motion   PT Next Visit Plan assess HEP, progress core strength   PT Home Exercise Plan stretching   Consulted and Agree with Plan of Care Patient          G-Codes - 04-10-15 1106    Functional Assessment Tool Used FOTO 42% limited   Functional Limitation Mobility: Walking and moving around   Mobility: Walking and Moving Around Current Status (H4765) At least 40 percent but less than 60 percent impaired, limited or restricted   Mobility: Walking and Moving Around Goal Status [phone]) At least 40 percent but less than 60 percent impaired, limited or restricted       Problem List Patient Active Problem List   Diagnosis Date Noted  . Influenza A (H1N1) 08/07/2014  . Abnormality of gait 11/05/2013  . OSA (obstructive sleep apnea) 07/23/2013  . Erectile dysfunction 07/23/2013  . NSTEMI (non-ST elevated myocardial infarction) ([place]) 04/16/2013  . Anginal chest pain at rest [facility]) 04/15/2013  . CAD (coronary artery disease), Hx of LAD DES in 2005 04/15/2013  . Peripheral neuropathy ([place]) 04/15/2013  . Hyperlipidemia with target LDL  less than 70 04/15/2013    Isabelle Course, PT, DPT  04-10-15, 11:08 AM  [facility] [address], [place], [place], [address] Phone: [phone]   Fax:  [phone]  Name: Corey Larson. MRN: [id number] Date of Birth: 08/31/1927

## 2015-03-29 ENCOUNTER — Ambulatory Visit: Payer: Medicare Other

## 2015-03-29 DIAGNOSIS — M545 Low back pain: Secondary | ICD-10-CM | POA: Diagnosis not present

## 2015-03-29 DIAGNOSIS — G8929 Other chronic pain: Secondary | ICD-10-CM | POA: Diagnosis not present

## 2015-03-29 DIAGNOSIS — R531 Weakness: Secondary | ICD-10-CM | POA: Diagnosis not present

## 2015-03-29 NOTE — Therapy (Signed)
[facility] [address] [place], [place], [address] Phone: [phone]   Fax:  [phone]  Physical Therapy Treatment  Patient Details  Name: Corey Larson. MRN: [id number] Date of Birth: May 24, 1928 Referring Provider: Noah Delaine  Encounter Date: 03/29/2015      PT End of Session - 03/29/15 1141    Visit Number 2   Number of Visits 10   Date for PT Re-Evaluation 05/19/15   PT Start Time 1104   PT Stop Time 1143   PT Time Calculation (min) 39 min   Activity Tolerance Patient tolerated treatment well   Behavior During Therapy [facility] for tasks assessed/performed      Past Medical History  Diagnosis Date  . CAD (coronary artery disease) 2005    stent to LAD by Dr. Glade Lloyd. mild irregulatities of RCA.    . OSA (obstructive sleep apnea)     mild, never on cpap  . Hyperlipidemia LDL goal < 70   . H/O exercise stress test 05/2012    no statistically significant ischemia.   . Peripheral neuropathy ([place])   . Macular degeneration     bilateral  . Hypertension   . CRF (chronic renal failure)   . Insomnia   . ED (erectile dysfunction)   . Vitamin D deficiency   . Abnormality of gait 11/05/2013  . History of TIA (transient ischemic attack)     transient confusion  . Macular degeneration     Right>left  . HOH (hard of hearing)     hearing aids    Past Surgical History  Procedure Laterality Date  . Knee surgery      right knee 2011   . Ankle surgery      right ankle 2002 fracture  . Back surgery  1976    laminectomy  . Hernia repair  1981    "double hernia"  . Cataract extraction      bilateral  . Left heart catheterization with coronary angiogram N/A 04/16/2013    Procedure: LEFT HEART CATHETERIZATION WITH CORONARY ANGIOGRAM;  Surgeon: Leonie Man, MD;  Location: [facility] CATH LAB;  Service: Cardiovascular;  Laterality: N/A;  . Transthoracic echocardiogram  05/12/2010    EF =>55%. MILD CONCENTRIC LVH.  TRACE MITRAL REGURG.  . Nm myocar multiple w/spect  05/13/2012    MILD UPPER SEPTAL THINNING AND DIAPHRAGMATIC ATTENUATION W/O SIGNIFICANT ISCHEMIA. EF 66%.  . Cardiac catheterization      2005, stent placed DES LAD  . Coronary angioplasty      There were no vitals filed for this visit.  Visit Diagnosis:  Weakness generalized  Chronic bilateral low back pain without sciatica      Subjective Assessment - 03/29/15 1109    Subjective Pt reports that he has not been compliant with his HEP.  "I always feel stiff"   Currently in Pain? No/denies                         [facility] Adult PT Treatment/Exercise - 03/29/15 0001    Exercises   Exercises Knee/Hip   Lumbar Exercises: Stretches   Active Hamstring Stretch 30 seconds;3 reps  seated and supine with strap   Single Knee to Chest Stretch 30 seconds;3 reps   Lower Trunk Rotation 3 reps;20 seconds   Pelvic Tilt 5 reps;10 seconds   Piriformis Stretch 3 reps;20 seconds  seated   Knee/Hip Exercises: Aerobic   Nustep Level 1 x 8 minutes  seat 8, arms 10                PT Education - 03/29/15 1128    Education provided Yes   Education Details HEP: seated hamstring stretch   Person(s) Educated Patient   Methods Explanation;Demonstration;Handout   Comprehension Verbalized understanding;Returned demonstration          PT Short Term Goals - 03/29/15 1110    PT SHORT TERM GOAL #1   Title Pt will be independent in initial HEP   Time 4   Period Weeks   Status On-going   PT SHORT TERM GOAL #3   Title Pt will tolerate standing x 15 minutes withotu increased pain   Time 4   Period Weeks   Status On-going           PT Long Term Goals - 03/24/15 1055    PT LONG TERM GOAL #1   Title Pt will improve FOTO to < = 40% to demo improved functional mobility   Time 8   Period Weeks   Status New   PT LONG TERM GOAL #2   Title Pt will improve hip strength to 4+/5 all directions   Time 8   Period Weeks    Status New   PT LONG TERM GOAL #3   Title Pt will be indepents with advanced HEP   Time 8   Period Weeks   Status New   PT LONG TERM GOAL #4   Title Pt will tolerate standing x 20 minutes without increased pain   Time 8   Period Weeks   Status New               Plan - 03/29/15 1111    Clinical Impression Statement Pt with only 1 session after evaluation.  Pt reports only minimal compliance with HEP.  Pt demonstrated HEP correctly in the clinic today.  No significant change in status due to only 1 session of PT.  Pt with postural dysfunction, pain and weakness that will benefit from skilled PT for advancement HEP and pain management.     Pt will benefit from skilled therapeutic intervention in order to improve on the following deficits Decreased endurance;Decreased range of motion;Pain;Increased fascial restricitons;Impaired sensation;Impaired flexibility;Difficulty walking;Decreased strength;Decreased activity tolerance;Postural dysfunction   Rehab Potential Good   PT Frequency 2x / week   PT Duration 8 weeks   PT Treatment/Interventions ADLs/Self Care Home Management;Cryotherapy;Electrical Stimulation;Iontophoresis 4mg /ml Dexamethasone;Moist Heat;Taping;Patient/family education;Neuromuscular re-education;Therapeutic exercise;Therapeutic activities;Functional mobility training;Manual techniques;Passive range of motion   PT Next Visit Plan Continue hip flexibility, core strength, hip strength exercises   Consulted and Agree with Plan of Care Patient        Problem List Patient Active Problem List   Diagnosis Date Noted  . Influenza A (H1N1) 08/07/2014  . Abnormality of gait 11/05/2013  . OSA (obstructive sleep apnea) 07/23/2013  . Erectile dysfunction 07/23/2013  . NSTEMI (non-ST elevated myocardial infarction) ([place]) 04/16/2013  . Anginal chest pain at rest [facility]) 04/15/2013  . CAD (coronary artery disease), Hx of LAD DES in 2005 04/15/2013  . Peripheral neuropathy ([place])  04/15/2013  . Hyperlipidemia with target LDL less than 70 04/15/2013    Ziaire Hagos, PT 03/29/2015, 11:42 AM  [facility] [address] [place], [place], [address] Phone: [phone]   Fax:  [phone]  Name: Jeren Dufrane. MRN: [id number] Date of Birth: [birth year]

## 2015-03-29 NOTE — Patient Instructions (Signed)
HIP: Hamstrings - Short Sitting    Rest leg on raised surface. Keep knee straight. Lift chest. Hold _20__ seconds. _3__ reps per set, __3_ sets per day  Copyright  VHI. All rights reserved.  [facility] [address], [address] [place], [place] [address] Phone # [phone] Fax [phone] 

## 2015-03-31 ENCOUNTER — Encounter: Payer: Self-pay | Admitting: Physical Therapy

## 2015-03-31 ENCOUNTER — Ambulatory Visit: Payer: Medicare Other | Admitting: Physical Therapy

## 2015-03-31 DIAGNOSIS — G8929 Other chronic pain: Secondary | ICD-10-CM | POA: Diagnosis not present

## 2015-03-31 DIAGNOSIS — R531 Weakness: Secondary | ICD-10-CM | POA: Diagnosis not present

## 2015-03-31 DIAGNOSIS — M545 Low back pain, unspecified: Secondary | ICD-10-CM

## 2015-03-31 NOTE — Therapy (Signed)
[facility] [address], [place], [place], [address] Phone: [phone]   Fax:  [phone]  Physical Therapy Treatment  Patient Details  Name: Corey Larson. MRN: [id number] Date of Birth: [birth year] Referring Provider: Noah Delaine  Encounter Date: 03/31/2015      PT End of Session - 03/31/15 1238    Visit Number 3   Number of Visits 10  Medicare   Date for PT Re-Evaluation 05/19/15   PT Start Time 1230   PT Stop Time 1310   PT Time Calculation (min) 40 min   Activity Tolerance Patient tolerated treatment well   Behavior During Therapy [facility] for tasks assessed/performed      Past Medical History  Diagnosis Date  . CAD (coronary artery disease) 2005    stent to LAD by Dr. Glade Lloyd. mild irregulatities of RCA.    . OSA (obstructive sleep apnea)     mild, never on cpap  . Hyperlipidemia LDL goal < 70   . H/O exercise stress test 05/2012    no statistically significant ischemia.   . Peripheral neuropathy ([place])   . Macular degeneration     bilateral  . Hypertension   . CRF (chronic renal failure)   . Insomnia   . ED (erectile dysfunction)   . Vitamin D deficiency   . Abnormality of gait 11/05/2013  . History of TIA (transient ischemic attack)     transient confusion  . Macular degeneration     Right>left  . HOH (hard of hearing)     hearing aids    Past Surgical History  Procedure Laterality Date  . Knee surgery      right knee 2011   . Ankle surgery      right ankle 2002 fracture  . Back surgery  1976    laminectomy  . Hernia repair  1981    "double hernia"  . Cataract extraction      bilateral  . Left heart catheterization with coronary angiogram N/A 04/16/2013    Procedure: LEFT HEART CATHETERIZATION WITH CORONARY ANGIOGRAM;  Surgeon: Leonie Man, MD;  Location: [facility] CATH LAB;  Service: Cardiovascular;  Laterality: N/A;  . Transthoracic echocardiogram  05/12/2010    EF =>55%. MILD  CONCENTRIC LVH. TRACE MITRAL REGURG.  . Nm myocar multiple w/spect  05/13/2012    MILD UPPER SEPTAL THINNING AND DIAPHRAGMATIC ATTENUATION W/O SIGNIFICANT ISCHEMIA. EF 66%.  . Cardiac catheterization      2005, stent placed DES LAD  . Coronary angioplasty      There were no vitals filed for this visit.  Visit Diagnosis:  Weakness generalized  Chronic bilateral low back pain without sciatica      Subjective Assessment - 03/31/15 1239    Subjective I am getting over a cold.  Twisting movements cause pain.  I am not able to play golf due to pain.    Pertinent History peripheral neuropathy   Limitations Standing   How long can you stand comfortably? 10 minutes   Patient Stated Goals decrease pain and stiffness, be able to work in yard and stand without symptoms   Currently in Pain? No/denies                         [facility] Adult PT Treatment/Exercise - 03/31/15 0001    Posture/Postural Control   Posture/Postural Control Postural limitations   Postural Limitations Rounded Shoulders;Forward head;Increased thoracic kyphosis   Posture Comments instructed on  correct posture in sitting and standing   Lumbar Exercises: Stretches   Active Hamstring Stretch 30 seconds;3 reps  seated and supine with strap   Single Knee to Chest Stretch 30 seconds;3 reps   Lower Trunk Rotation 3 reps;20 seconds   Pelvic Tilt 5 reps;10 seconds   Lumbar Exercises: Supine   Ab Set 10 reps  hold 10 sec while pushing into physioball   AB Set Limitations tried breathing and hip adduction to contract the abdominals    Bridge 10 reps   Other Supine Lumbar Exercises hookly bringing ball overhead to contract abdominals;    Knee/Hip Exercises: Standing   Other Standing Knee Exercises walking up steps 2x no rails with supervision due to balance   Knee/Hip Exercises: Seated   Sit to Sand 10 reps;without UE support  supervision due to going forward a little once in standing                 PT Education - 03/31/15 1307    Education provided No          PT Short Term Goals - 03/29/15 1110    PT SHORT TERM GOAL #1   Title Pt will be independent in initial HEP   Time 4   Period Weeks   Status On-going   PT SHORT TERM GOAL #3   Title Pt will tolerate standing x 15 minutes withotu increased pain   Time 4   Period Weeks   Status On-going           PT Long Term Goals - 03/24/15 1055    PT LONG TERM GOAL #1   Title Pt will improve FOTO to < = 40% to demo improved functional mobility   Time 8   Period Weeks   Status New   PT LONG TERM GOAL #2   Title Pt will improve hip strength to 4+/5 all directions   Time 8   Period Weeks   Status New   PT LONG TERM GOAL #3   Title Pt will be indepents with advanced HEP   Time 8   Period Weeks   Status New   PT LONG TERM GOAL #4   Title Pt will tolerate standing x 20 minutes without increased pain   Time 8   Period Weeks   Status New               Plan - 03/31/15 1307    Clinical Impression Statement Patient has not don alot of stretching due ot having a cold.  Patient was tired during therapy due to his cold.  Patient has to be monitored for balance with steps and sit to stand.  Patient had difficulty with abdominal contraction and needed man different verbal cues to find the one he is able to understand.  Patien twould benefit form physical therapy to improve core strength and work on posture.    Pt will benefit from skilled therapeutic intervention in order to improve on the following deficits Decreased endurance;Decreased range of motion;Pain;Increased fascial restricitons;Impaired sensation;Impaired flexibility;Difficulty walking;Decreased strength;Decreased activity tolerance;Postural dysfunction   Rehab Potential Good   PT Frequency 2x / week   PT Duration 8 weeks   PT Treatment/Interventions ADLs/Self Care Home Management;Cryotherapy;Electrical Stimulation;Iontophoresis 4mg /ml Dexamethasone;Moist  Heat;Taping;Patient/family education;Neuromuscular re-education;Therapeutic exercise;Therapeutic activities;Functional mobility training;Manual techniques;Passive range of motion   PT Next Visit Plan back extensor strength, posture   PT Home Exercise Plan posture   Consulted and Agree with Plan of Care Patient  Problem List Patient Active Problem List   Diagnosis Date Noted  . Influenza A (H1N1) 08/07/2014  . Abnormality of gait 11/05/2013  . OSA (obstructive sleep apnea) 07/23/2013  . Erectile dysfunction 07/23/2013  . NSTEMI (non-ST elevated myocardial infarction) ([place]) 04/16/2013  . Anginal chest pain at rest [facility]) 04/15/2013  . CAD (coronary artery disease), Hx of LAD DES in 2005 04/15/2013  . Peripheral neuropathy ([place]) 04/15/2013  . Hyperlipidemia with target LDL less than 70 04/15/2013    Jeyden Coffelt,PT 03/31/2015, 1:11 PM  [facility] [address] [place], [place], [address] Phone: [phone]   Fax:  [phone]  Name: Corey Larson. MRN: [id number] Date of Birth: 18-Jul-1927

## 2015-04-05 ENCOUNTER — Ambulatory Visit: Payer: Medicare Other | Attending: Internal Medicine | Admitting: Physical Therapy

## 2015-04-05 ENCOUNTER — Encounter: Payer: Self-pay | Admitting: Physical Therapy

## 2015-04-05 DIAGNOSIS — G8929 Other chronic pain: Secondary | ICD-10-CM | POA: Diagnosis not present

## 2015-04-05 DIAGNOSIS — M545 Low back pain, unspecified: Secondary | ICD-10-CM

## 2015-04-05 DIAGNOSIS — R531 Weakness: Secondary | ICD-10-CM | POA: Insufficient documentation

## 2015-04-05 NOTE — Patient Instructions (Addendum)
HIP / KNEE: Extension - Standing    Squeeze glutes. Raise and lift leg backward. Keep knee straight or slightly bent. _30__ reps per set, _1__ sets per day, _1__ days per week Hold onto a support.  Copyright  VHI. All rights reserved.  HIP: Abduction - Standing    Squeeze glutes. Raise leg out and slightly back. _30__ reps per set, _1__ sets per day, _1__ days per week Hold onto a support.  Copyright  VHI. All rights reserved.  Bridging    Slowly raise buttocks from floor, keeping stomach tight. Repeat _15___ times per set. Do __1__ sets per session. Do __1__ sessions per day.  http://orth.exer.us/1096   Copyright  VHI. All rights reserved.    PNF Strengthening: Resisted   Standing with resistive band around each hand, bring right arm up and away, thumb back. Repeat _10___ times per set. Do _2___ sets per session. Do _1-2___ sessions per day.      Resisted Horizontal Abduction: Bilateral   Sit or stand, tubing in both hands, arms out in front. Keeping arms straight, pinch shoulder blades together and stretch arms out. Repeat _10___ times per set. Do 2____ sets per session. Do _1-2___ sessions per day.  [facility] [address] [place], [place] [address] Phone # [phone] Fax [phone]

## 2015-04-05 NOTE — Therapy (Signed)
[facility] [address] [place], [place], [address] Phone: [phone]   Fax:  [phone]  Physical Therapy Treatment  Patient Details  Name: Corey Larson. MRN: [id number] Date of Birth: [birth year] Referring Provider: Dr. Thressa Sheller  Encounter Date: 04/05/2015      PT End of Session - 04/05/15 1104    Visit Number 4   Number of Visits 10  Medicare   Date for PT Re-Evaluation 05/19/15   PT Start Time 1100   PT Stop Time 1140   PT Time Calculation (min) 40 min   Activity Tolerance Patient tolerated treatment well   Behavior During Therapy [facility] for tasks assessed/performed      Past Medical History  Diagnosis Date  . CAD (coronary artery disease) 2005    stent to LAD by Dr. Glade Lloyd. mild irregulatities of RCA.    . OSA (obstructive sleep apnea)     mild, never on cpap  . Hyperlipidemia LDL goal < 70   . H/O exercise stress test 05/2012    no statistically significant ischemia.   . Peripheral neuropathy ([place])   . Macular degeneration     bilateral  . Hypertension   . CRF (chronic renal failure)   . Insomnia   . ED (erectile dysfunction)   . Vitamin D deficiency   . Abnormality of gait 11/05/2013  . History of TIA (transient ischemic attack)     transient confusion  . Macular degeneration     Right>left  . HOH (hard of hearing)     hearing aids    Past Surgical History  Procedure Laterality Date  . Knee surgery      right knee 2011   . Ankle surgery      right ankle 2002 fracture  . Back surgery  1976    laminectomy  . Hernia repair  1981    "double hernia"  . Cataract extraction      bilateral  . Left heart catheterization with coronary angiogram N/A 04/16/2013    Procedure: LEFT HEART CATHETERIZATION WITH CORONARY ANGIOGRAM;  Surgeon: Leonie Man, MD;  Location: [facility] CATH LAB;  Service: Cardiovascular;  Laterality: N/A;  . Transthoracic echocardiogram  05/12/2010    EF =>55%.  MILD CONCENTRIC LVH. TRACE MITRAL REGURG.  . Nm myocar multiple w/spect  05/13/2012    MILD UPPER SEPTAL THINNING AND DIAPHRAGMATIC ATTENUATION W/O SIGNIFICANT ISCHEMIA. EF 66%.  . Cardiac catheterization      2005, stent placed DES LAD  . Coronary angioplasty      There were no vitals filed for this visit.  Visit Diagnosis:  Weakness generalized  Chronic bilateral low back pain without sciatica      Subjective Assessment - 04/05/15 1104    Subjective I am having trouble getting over a cold.  I have not been able to do my exercises.    Pertinent History peripheral neuropathy   Limitations Standing   How long can you stand comfortably? 10 minutes   Patient Stated Goals decrease pain and stiffness, be able to work in yard and stand without symptoms   Currently in Pain? No/denies            [facility] PT Assessment - 04/05/15 0001    Assessment   Medical Diagnosis low back ache   Referring Provider Dr. Thressa Sheller   Onset Date/Surgical Date 02/03/15   Balance Screen   Has the patient fallen in the past 6 months No  Has the patient had a decrease in activity level because of a fear of falling?  No   Is the patient reluctant to leave their home because of a fear of falling?  No   Cognition   Overall Cognitive Status Within Functional Limits for tasks assessed   Strength   Overall Strength Comments Bilat hips 3/5, hip abd 3/5, hip ext 3-/5                     [facility] Adult PT Treatment/Exercise - 04/05/15 0001    Lumbar Exercises: Stretches   Active Hamstring Stretch 30 seconds;3 reps  seated and supine with strap   Lumbar Exercises: Aerobic   Elliptical level 1 2 min   winded afterwards   Lumbar Exercises: Supine   Bent Knee Raise 10 reps  each side with arms, VC to coordinate   Bridge 10 reps   Knee/Hip Exercises: Standing   Hip Abduction Stengthening;Right;Left  30x   Other Standing Knee Exercises standing hip extension 30x bil., abduction 30x bil.     Shoulder Exercises: Seated   Horizontal ABduction 10 reps;Strengthening;Theraband   Theraband Level (Shoulder Horizontal ABduction) Level 1 (Yellow)   Other Seated Exercises Diagonal  with yellow band 10 each way                PT Education - 04/05/15 1126    Education provided Yes   Education Details standing hip abduction and extension; sitting yellow therapband exercises   Person(s) Educated Patient   Methods Explanation;Demonstration;Handout   Comprehension Returned demonstration;Verbalized understanding          PT Short Term Goals - 04/05/15 1139    PT SHORT TERM GOAL #1   Title Pt will be independent in initial HEP   Time 4   Period Weeks   Status Achieved   PT SHORT TERM GOAL #2   Title Pt will improve hip strength to 4/5 all directions   Time 4   Period Weeks   Status New   PT SHORT TERM GOAL #3   Title Pt will tolerate standing x 15 minutes withotu increased pain   Time 4   Period Weeks   Status Achieved  gets pain in feet due to neuropathy           PT Long Term Goals - 03/24/15 1055    PT LONG TERM GOAL #1   Title Pt will improve FOTO to < = 40% to demo improved functional mobility   Time 8   Period Weeks   Status New   PT LONG TERM GOAL #2   Title Pt will improve hip strength to 4+/5 all directions   Time 8   Period Weeks   Status New   PT LONG TERM GOAL #3   Title Pt will be indepents with advanced HEP   Time 8   Period Weeks   Status New   PT LONG TERM GOAL #4   Title Pt will tolerate standing x 20 minutes without increased pain   Time 8   Period Weeks   Status New               Plan - 04/05/15 1143    Clinical Impression Statement Patient is a 81 year old male with diagnosis of low back ache.  Patient has met STG # #3 for his back but will have increased pain in feet due to neuropathy.  Patient was able to do the elliptical but need to  sit and rest after 2 min. Patient needs back strengthening due to his forward  posture.  Patient continues to fatique easily due to his cold.  Patient would benefit from physical therapy to reduce back pain and increase strength.    Pt will benefit from skilled therapeutic intervention in order to improve on the following deficits Decreased endurance;Decreased range of motion;Pain;Increased fascial restricitons;Impaired sensation;Impaired flexibility;Difficulty walking;Decreased strength;Decreased activity tolerance;Postural dysfunction   Rehab Potential Good   Clinical Impairments Affecting Rehab Potential None   PT Frequency 2x / week   PT Duration 8 weeks   PT Treatment/Interventions ADLs/Self Care Home Management;Cryotherapy;Electrical Stimulation;Iontophoresis 46m/ml Dexamethasone;Moist Heat;Taping;Patient/family education;Neuromuscular re-education;Therapeutic exercise;Therapeutic activities;Functional mobility training;Manual techniques;Passive range of motion   PT Next Visit Plan back extensor strength, posture   PT Home Exercise Plan progress as needed   Recommended Other Services None   Consulted and Agree with Plan of Care Patient        Problem List Patient Active Problem List   Diagnosis Date Noted  . Influenza A (H1N1) 08/07/2014  . Abnormality of gait 11/05/2013  . OSA (obstructive sleep apnea) 07/23/2013  . Erectile dysfunction 07/23/2013  . NSTEMI (non-ST elevated myocardial infarction) ([place] 04/16/2013  . Anginal chest pain at rest ([facility] 04/15/2013  . CAD (coronary artery disease), Hx of LAD DES in 2005 04/15/2013  . Peripheral neuropathy ([place] 04/15/2013  . Hyperlipidemia with target LDL less than 70 04/15/2013    GRAY,CHERYL,PT 04/05/2015, 11:47 AM  [facility] [address] [place] [address]Phone: [phone]  Fax:  [phone] Name: RErmine Larson MRN: [id number] of Birth: 128-May-1929

## 2015-04-07 ENCOUNTER — Ambulatory Visit: Payer: Medicare Other | Admitting: Physical Therapy

## 2015-04-07 DIAGNOSIS — M545 Low back pain: Secondary | ICD-10-CM | POA: Diagnosis not present

## 2015-04-07 DIAGNOSIS — R531 Weakness: Secondary | ICD-10-CM

## 2015-04-07 DIAGNOSIS — G8929 Other chronic pain: Secondary | ICD-10-CM | POA: Diagnosis not present

## 2015-04-07 NOTE — Therapy (Signed)
[facility] [address], [place], [place], [address] Phone: [phone]   Fax:  [phone]  Physical Therapy Treatment  Patient Details  Name: Corey Larson. MRN: [id number] Date of Birth: 07-15-1927 Referring Provider: Dr. Thressa Sheller  Encounter Date: 04/07/2015      PT End of Session - 04/07/15 1049    PT Start Time 1015   PT Stop Time 1055   PT Time Calculation (min) 40 min   Activity Tolerance Patient tolerated treatment well   Behavior During Therapy [facility] for tasks assessed/performed      Past Medical History  Diagnosis Date  . CAD (coronary artery disease) 2005    stent to LAD by Dr. Glade Lloyd. mild irregulatities of RCA.    . OSA (obstructive sleep apnea)     mild, never on cpap  . Hyperlipidemia LDL goal < 70   . H/O exercise stress test 05/2012    no statistically significant ischemia.   . Peripheral neuropathy ([place])   . Macular degeneration     bilateral  . Hypertension   . CRF (chronic renal failure)   . Insomnia   . ED (erectile dysfunction)   . Vitamin D deficiency   . Abnormality of gait 11/05/2013  . History of TIA (transient ischemic attack)     transient confusion  . Macular degeneration     Right>left  . HOH (hard of hearing)     hearing aids    Past Surgical History  Procedure Laterality Date  . Knee surgery      right knee 2011   . Ankle surgery      right ankle 2002 fracture  . Back surgery  1976    laminectomy  . Hernia repair  1981    "double hernia"  . Cataract extraction      bilateral  . Left heart catheterization with coronary angiogram N/A 04/16/2013    Procedure: LEFT HEART CATHETERIZATION WITH CORONARY ANGIOGRAM;  Surgeon: Leonie Man, MD;  Location: [facility] CATH LAB;  Service: Cardiovascular;  Laterality: N/A;  . Transthoracic echocardiogram  05/12/2010    EF =>55%. MILD CONCENTRIC LVH. TRACE MITRAL REGURG.  . Nm myocar multiple w/spect  05/13/2012   MILD UPPER SEPTAL THINNING AND DIAPHRAGMATIC ATTENUATION W/O SIGNIFICANT ISCHEMIA. EF 66%.  . Cardiac catheterization      2005, stent placed DES LAD  . Coronary angioplasty      There were no vitals filed for this visit.  Visit Diagnosis:  Weakness generalized  Chronic bilateral low back pain without sciatica      Subjective Assessment - 04/07/15 1020    Subjective I feel like I'm getting over my cold. I did the exercises with the band.   Pertinent History peripheral neuropathy   Limitations Standing   Patient Stated Goals decrease pain and stiffness, be able to work in yard and stand without symptoms                         [facility] Adult PT Treatment/Exercise - 04/07/15 0001    Exercises   Exercises Shoulder   Lumbar Exercises: Aerobic   Stationary Bike 5 mins level 2   Lumbar Exercises: Supine   Bent Knee Raise 20 reps   Bridge 20 reps   Knee/Hip Exercises: Standing   Hip Abduction --  30x   Hip Extension Stengthening;Both  30x   Shoulder Exercises: Seated   Horizontal ABduction Strengthening;Both;20 reps;Theraband  Theraband Level (Shoulder Horizontal ABduction) Level 1 (Yellow)   Other Seated Exercises diagonals x 10 bilat   Shoulder Exercises: Standing   Extension Strengthening;Both;20 reps;Theraband   Theraband Level (Shoulder Extension) Level 1 (Yellow)   Row Both;20 reps;Theraband   Theraband Level (Shoulder Row) Level 1 (Yellow)                  PT Short Term Goals - 04/07/15 1046    PT SHORT TERM GOAL #1   Title Pt will be independent in initial HEP   Time 4   Period Weeks   Status Achieved   PT SHORT TERM GOAL #2   Title Pt will improve hip strength to 4/5 all directions   Time 4   Period Weeks   Status On-going   PT SHORT TERM GOAL #3   Title Pt will tolerate standing x 15 minutes withotu increased pain   Time 4   Period Weeks   Status Achieved           PT Long Term Goals - 04/07/15 1047    PT LONG TERM GOAL  #1   Title Pt will improve FOTO to < = 40% to demo improved functional mobility   Time 8   Period Weeks   Status On-going   PT LONG TERM GOAL #2   Title Pt will improve hip strength to 4+/5 all directions   Time 8   Period Weeks   Status On-going   PT LONG TERM GOAL #3   Title Pt will be indepents with advanced HEP   Time 8   Period Weeks   Status On-going   PT LONG TERM GOAL #4   Title Pt will tolerate standing x 20 minutes without increased pain   Time 8   Period Weeks   Status On-going               Plan - 04/07/15 1049    Clinical Impression Statement Pt improves posture with cues, able to tolerate back ext exercises today. still difficulty with coordinating ab contraction with LE movement. Pt will continue to beneit from skilled PT to improve strength and posture and decrease pain.   Pt will benefit from skilled therapeutic intervention in order to improve on the following deficits Decreased endurance;Decreased range of motion;Pain;Increased fascial restricitons;Impaired sensation;Impaired flexibility;Difficulty walking;Decreased strength;Decreased activity tolerance;Postural dysfunction   Rehab Potential Good   Clinical Impairments Affecting Rehab Potential None   PT Frequency 2x / week   PT Duration 8 weeks   PT Treatment/Interventions ADLs/Self Care Home Management;Cryotherapy;Electrical Stimulation;Iontophoresis 4mg /ml Dexamethasone;Moist Heat;Taping;Patient/family education;Neuromuscular re-education;Therapeutic exercise;Therapeutic activities;Functional mobility training;Manual techniques;Passive range of motion   PT Next Visit Plan continue back extensor strength, add theraband to standing hip exercises        Problem List Patient Active Problem List   Diagnosis Date Noted  . Influenza A (H1N1) 08/07/2014  . Abnormality of gait 11/05/2013  . OSA (obstructive sleep apnea) 07/23/2013  . Erectile dysfunction 07/23/2013  . NSTEMI (non-ST elevated myocardial  infarction) ([place]) 04/16/2013  . Anginal chest pain at rest [facility]) 04/15/2013  . CAD (coronary artery disease), Hx of LAD DES in 2005 04/15/2013  . Peripheral neuropathy ([place]) 04/15/2013  . Hyperlipidemia with target LDL less than 70 04/15/2013    Isabelle Course, PT, DPT  04/07/2015, 10:52 AM  [facility] [address], [place], [place], [address] Phone: [phone]   Fax:  [phone]  Name: Corey Larson. MRN:  [id number] Date of Birth: 12/12/1927

## 2015-04-12 ENCOUNTER — Ambulatory Visit: Payer: Medicare Other | Admitting: Physical Therapy

## 2015-04-12 ENCOUNTER — Encounter: Payer: Self-pay | Admitting: Physical Therapy

## 2015-04-12 DIAGNOSIS — G8929 Other chronic pain: Secondary | ICD-10-CM

## 2015-04-12 DIAGNOSIS — M545 Low back pain: Secondary | ICD-10-CM

## 2015-04-12 DIAGNOSIS — R531 Weakness: Secondary | ICD-10-CM

## 2015-04-12 NOTE — Therapy (Signed)
[facility] [address], [place], [place], [address] Phone: [phone]   Fax:  [phone]  Physical Therapy Treatment  Patient Details  Name: Corey Larson. MRN: [id number] Date of Birth: 1927/11/08 Referring Provider: Dr. Thressa Sheller  Encounter Date: 04/12/2015      PT End of Session - 04/12/15 1200    Visit Number 6   Number of Visits 10  Medicare   Date for PT Re-Evaluation 05/19/15   PT Start Time 6734   PT Stop Time 1225   PT Time Calculation (min) 40 min   Activity Tolerance Patient tolerated treatment well   Behavior During Therapy [facility] for tasks assessed/performed      Past Medical History  Diagnosis Date  . CAD (coronary artery disease) 2005    stent to LAD by Dr. Glade Lloyd. mild irregulatities of RCA.    . OSA (obstructive sleep apnea)     mild, never on cpap  . Hyperlipidemia LDL goal < 70   . H/O exercise stress test 05/2012    no statistically significant ischemia.   . Peripheral neuropathy ([place])   . Macular degeneration     bilateral  . Hypertension   . CRF (chronic renal failure)   . Insomnia   . ED (erectile dysfunction)   . Vitamin D deficiency   . Abnormality of gait 11/05/2013  . History of TIA (transient ischemic attack)     transient confusion  . Macular degeneration     Right>left  . HOH (hard of hearing)     hearing aids    Past Surgical History  Procedure Laterality Date  . Knee surgery      right knee 2011   . Ankle surgery      right ankle 2002 fracture  . Back surgery  1976    laminectomy  . Hernia repair  1981    "double hernia"  . Cataract extraction      bilateral  . Left heart catheterization with coronary angiogram N/A 04/16/2013    Procedure: LEFT HEART CATHETERIZATION WITH CORONARY ANGIOGRAM;  Surgeon: Leonie Man, MD;  Location: [facility] CATH LAB;  Service: Cardiovascular;  Laterality: N/A;  . Transthoracic echocardiogram  05/12/2010    EF =>55%.  MILD CONCENTRIC LVH. TRACE MITRAL REGURG.  . Nm myocar multiple w/spect  05/13/2012    MILD UPPER SEPTAL THINNING AND DIAPHRAGMATIC ATTENUATION W/O SIGNIFICANT ISCHEMIA. EF 66%.  . Cardiac catheterization      2005, stent placed DES LAD  . Coronary angioplasty      There were no vitals filed for this visit.  Visit Diagnosis:  Weakness generalized  Chronic bilateral low back pain without sciatica      Subjective Assessment - 04/12/15 1158    Subjective I feel stronger and better since I am over my cold. I am doing my exercises at home regularly.    Limitations Standing   How long can you stand comfortably? 15 min.   Patient Stated Goals decrease pain and stiffness, be able to work in yard and stand without symptoms   Currently in Pain? No/denies            [facility] PT Assessment - 04/12/15 0001    Assessment   Medical Diagnosis low back ache   Onset Date/Surgical Date 02/03/15   ROM / Strength   AROM / PROM / Strength Strength   Strength   Strength Assessment Site Hip   Right Hip Flexion 3+/5  Right Hip Extension 4+/5   Right Hip ABduction 4+/5   Left Hip Flexion 4-/5   Left Hip Extension 4-/5   Left Hip ABduction 4-/5                     [facility] Adult PT Treatment/Exercise - 04/12/15 0001    Lumbar Exercises: Stretches   Passive Hamstring Stretch 30 seconds;1 rep  bil.    Piriformis Stretch 1 rep;30 seconds  bil.    Lumbar Exercises: Aerobic   Stationary Bike 6 mins level 2  right knee pain   Lumbar Exercises: Supine   Bridge 10 reps  10x with knees in/out   Lumbar Exercises: Sidelying   Hip Abduction 10 reps  2 sets; bil.    Hip Abduction Limitations tactile cues to prevent hip flexion   Knee/Hip Exercises: Standing   Hip Abduction Left;Right;3 sets;10 reps  1#    Hip Extension Right;Left;3 sets;10 reps  1#   Other Standing Knee Exercises resistive walking 20# 5x forward/5xbackward with supervision   Shoulder Exercises: Standing   Extension  Strengthening;Both;10 reps  pulleys 15# 2x10   Extension Limitations VC on upright posture                PT Education - 04/12/15 1226    Education provided No          PT Short Term Goals - 04/12/15 1200    PT SHORT TERM GOAL #1   Title Pt will be independent in initial HEP   Time 4   Period Weeks   Status Achieved   PT SHORT TERM GOAL #2   Title Pt will improve hip strength to 4/5 all directions   Time 4   Period Weeks   Status On-going  left hip is 4-/5 and right hip flexion 3+/5   PT SHORT TERM GOAL #3   Title Pt will tolerate standing x 15 minutes withotu increased pain   Time 4   Period Weeks   Status Achieved           PT Long Term Goals - 04/07/15 1047    PT LONG TERM GOAL #1   Title Pt will improve FOTO to < = 40% to demo improved functional mobility   Time 8   Period Weeks   Status On-going   PT LONG TERM GOAL #2   Title Pt will improve hip strength to 4+/5 all directions   Time 8   Period Weeks   Status On-going   PT LONG TERM GOAL #3   Title Pt will be indepents with advanced HEP   Time 8   Period Weeks   Status On-going   PT LONG TERM GOAL #4   Title Pt will tolerate standing x 20 minutes without increased pain   Time 8   Period Weeks   Status On-going               Plan - 04/12/15 1226    Clinical Impression Statement Patient is able to exercise regularly due to feeling better from his cold.  Hip strength right/left: felxion 3+/4-/5,abduction 4+/4-/5, and extension 4+/4-/5.  Patient has increased strength. Patient has ont met his trength gaol due to hip weakness. Patient woul dbenefit form phsyical therpay to increase strength .    Pt will benefit from skilled therapeutic intervention in order to improve on the following deficits Decreased endurance;Decreased range of motion;Pain;Increased fascial restricitons;Impaired sensation;Impaired flexibility;Difficulty walking;Decreased strength;Decreased activity tolerance;Postural  dysfunction   Rehab  Potential Good   Clinical Impairments Affecting Rehab Potential None   PT Frequency 2x / week   PT Duration 8 weeks   PT Treatment/Interventions ADLs/Self Care Home Management;Cryotherapy;Electrical Stimulation;Iontophoresis 60m/ml Dexamethasone;Moist Heat;Taping;Patient/family education;Neuromuscular re-education;Therapeutic exercise;Therapeutic activities;Functional mobility training;Manual techniques;Passive range of motion   PT Next Visit Plan continue back extensor strength, add theraband to standing hip exercises; work on getting him ready for golf   PT Home Exercise Plan hip theraband exercise   Consulted and Agree with Plan of Care Patient        Problem List Patient Active Problem List   Diagnosis Date Noted  . Influenza A (H1N1) 08/07/2014  . Abnormality of gait 11/05/2013  . OSA (obstructive sleep apnea) 07/23/2013  . Erectile dysfunction 07/23/2013  . [place]STEMI (non-ST elevated myocardial infarction) ([place] 04/16/2013  . [facility] 04/15[facility]  . CAD (coronary artery disease), Hx of LAD DES in 2005 04/15/2013  . Peripheral neuropathy ([place] 04/15/2013  . Hyperlipidemia with target LDL less than 70 04/15/2013    Corey Larson,PT 04/12/2015, 12:29 PM  [facility] [address] [place] [address]Phone: [phone]  Fax:  [phone] Name: RNikki Rusnak MRN: [id number] of Birth: 1April 22, 1929

## 2015-04-14 ENCOUNTER — Ambulatory Visit: Payer: Medicare Other | Admitting: Physical Therapy

## 2015-04-14 DIAGNOSIS — R531 Weakness: Secondary | ICD-10-CM

## 2015-04-14 DIAGNOSIS — M545 Low back pain: Secondary | ICD-10-CM

## 2015-04-14 DIAGNOSIS — G8929 Other chronic pain: Secondary | ICD-10-CM | POA: Diagnosis not present

## 2015-04-14 NOTE — Therapy (Signed)
[facility] [address] [place], [place], [address] Phone: [phone]   Fax:  [phone]  Physical Therapy Treatment  Patient Details  Name: Corey Larson. MRN: [id number] Date of Birth: [birth year] Referring Provider: Dr. Thressa Sheller  Encounter Date: 04/14/2015      PT End of Session - 04/14/15 1053    Visit Number 7   Number of Visits 10   Date for PT Re-Evaluation 05/19/15   PT Start Time 1016   PT Stop Time 1056   PT Time Calculation (min) 40 min   Activity Tolerance Patient tolerated treatment well   Behavior During Therapy [facility] for tasks assessed/performed      Past Medical History  Diagnosis Date  . CAD (coronary artery disease) 2005    stent to LAD by Dr. Glade Lloyd. mild irregulatities of RCA.    . OSA (obstructive sleep apnea)     mild, never on cpap  . Hyperlipidemia LDL goal < 70   . H/O exercise stress test 05/2012    no statistically significant ischemia.   . Peripheral neuropathy ([place])   . Macular degeneration     bilateral  . Hypertension   . CRF (chronic renal failure)   . Insomnia   . ED (erectile dysfunction)   . Vitamin D deficiency   . Abnormality of gait 11/05/2013  . History of TIA (transient ischemic attack)     transient confusion  . Macular degeneration     Right>left  . HOH (hard of hearing)     hearing aids    Past Surgical History  Procedure Laterality Date  . Knee surgery      right knee 2011   . Ankle surgery      right ankle 2002 fracture  . Back surgery  1976    laminectomy  . Hernia repair  1981    "double hernia"  . Cataract extraction      bilateral  . Left heart catheterization with coronary angiogram N/A 04/16/2013    Procedure: LEFT HEART CATHETERIZATION WITH CORONARY ANGIOGRAM;  Surgeon: Leonie Man, MD;  Location: [facility] CATH LAB;  Service: Cardiovascular;  Laterality: N/A;  . Transthoracic echocardiogram  05/12/2010    EF =>55%. MILD  CONCENTRIC LVH. TRACE MITRAL REGURG.  . Nm myocar multiple w/spect  05/13/2012    MILD UPPER SEPTAL THINNING AND DIAPHRAGMATIC ATTENUATION W/O SIGNIFICANT ISCHEMIA. EF 66%.  . Cardiac catheterization      2005, stent placed DES LAD  . Coronary angioplasty      There were no vitals filed for this visit.  Visit Diagnosis:  Weakness generalized  Chronic bilateral low back pain without sciatica      Subjective Assessment - 04/14/15 1022    Subjective I feel pretty good, getting stronger.   Currently in Pain? No/denies                         [facility] Adult PT Treatment/Exercise - 04/14/15 0001    Lumbar Exercises: Stretches   Active Hamstring Stretch 2 reps;30 seconds  seated   Piriformis Stretch 2 reps;30 seconds  seated   Lumbar Exercises: Aerobic   Stationary Bike 5 mins level 2   Lumbar Exercises: Standing   Other Standing Lumbar Exercises simulated golf swing with yellow theraband x 10 bilat   Lumbar Exercises: Supine   Bridge 10 reps  10x with knees in/out   Lumbar Exercises: Sidelying   Hip Abduction  10 reps  2 sets bilat   Knee/Hip Exercises: Standing   Other Standing Knee Exercises resistive walking 5 x fwd, 5 x bkwd 20#                  PT Short Term Goals - 04/14/15 1055    PT SHORT TERM GOAL #1   Title Pt will be independent in initial HEP   Time 4   Period Weeks   Status Achieved   PT SHORT TERM GOAL #2   Title Pt will improve hip strength to 4/5 all directions   Time 4   Period Weeks   Status On-going   PT SHORT TERM GOAL #3   Title Pt will tolerate standing x 15 minutes withotu increased pain   Time 4   Status Achieved           PT Long Term Goals - 04/14/15 1056    PT LONG TERM GOAL #1   Title Pt will improve FOTO to < = 40% to demo improved functional mobility   Time 8   Period Weeks   Status On-going   PT LONG TERM GOAL #2   Title Pt will improve hip strength to 4+/5 all directions   Time 8   Period Weeks    Status On-going   PT LONG TERM GOAL #3   Title Pt will be indepents with advanced HEP   Time 8   Period Weeks   Status On-going   PT LONG TERM GOAL #4   Title Pt will tolerate standing x 20 minutes without increased pain   Time 8   Period Weeks   Status On-going               Plan - 04/14/15 1054    Clinical Impression Statement Patient improving strenght, still with limited endurance and flexed posture. Will continue to benefit from skilled PT to improve strength and posture.   Pt will benefit from skilled therapeutic intervention in order to improve on the following deficits Decreased endurance;Decreased range of motion;Pain;Increased fascial restricitons;Impaired sensation;Impaired flexibility;Difficulty walking;Decreased strength;Decreased activity tolerance;Postural dysfunction   Rehab Potential Good   PT Frequency 2x / week   PT Duration 8 weeks   PT Next Visit Plan continue golf exercises, add therabnd to standing hip exercises        Problem List Patient Active Problem List   Diagnosis Date Noted  . Influenza A (H1N1) 08/07/2014  . Abnormality of gait 11/05/2013  . OSA (obstructive sleep apnea) 07/23/2013  . Erectile dysfunction 07/23/2013  . NSTEMI (non-ST elevated myocardial infarction) ([place]) 04/16/2013  . Anginal chest pain at rest [facility]) 04/15/2013  . CAD (coronary artery disease), Hx of LAD DES in 2005 04/15/2013  . Peripheral neuropathy ([place]) 04/15/2013  . Hyperlipidemia with target LDL less than 70 04/15/2013    Corey Larson, PT, DPT  04/14/2015, 10:57 AM  [facility] [address], [place], [place], [address] Phone: [phone]   Fax:  [phone]  Name: Corey Larson. MRN: [id number] Date of Birth: 15-Aug-1927

## 2015-04-19 ENCOUNTER — Encounter: Payer: Self-pay | Admitting: Physical Therapy

## 2015-04-19 ENCOUNTER — Ambulatory Visit: Payer: Medicare Other | Admitting: Physical Therapy

## 2015-04-19 DIAGNOSIS — M545 Low back pain: Secondary | ICD-10-CM

## 2015-04-19 DIAGNOSIS — G8929 Other chronic pain: Secondary | ICD-10-CM | POA: Diagnosis not present

## 2015-04-19 DIAGNOSIS — R531 Weakness: Secondary | ICD-10-CM

## 2015-04-19 NOTE — Therapy (Signed)
[facility] [address] [place], [place], [address] Phone: [phone]   Fax:  [phone]  Physical Therapy Treatment  Patient Details  Name: Corey Larson. MRN: [id number] Date of Birth: [birth year] Referring Provider: Dr. Thressa Sheller  Encounter Date: 04/19/2015      PT End of Session - 04/19/15 1154    Visit Number 8   Number of Visits 10  Medicare   Date for PT Re-Evaluation 05/19/15   PT Start Time 1155   PT Stop Time 1230   PT Time Calculation (min) 35 min   Activity Tolerance Patient tolerated treatment well   Behavior During Therapy [facility] for tasks assessed/performed      Past Medical History  Diagnosis Date  . CAD (coronary artery disease) 2005    stent to LAD by Dr. Glade Lloyd. mild irregulatities of RCA.    . OSA (obstructive sleep apnea)     mild, never on cpap  . Hyperlipidemia LDL goal < 70   . H/O exercise stress test 05/2012    no statistically significant ischemia.   . Peripheral neuropathy ([place])   . Macular degeneration     bilateral  . Hypertension   . CRF (chronic renal failure)   . Insomnia   . ED (erectile dysfunction)   . Vitamin D deficiency   . Abnormality of gait 11/05/2013  . History of TIA (transient ischemic attack)     transient confusion  . Macular degeneration     Right>left  . HOH (hard of hearing)     hearing aids    Past Surgical History  Procedure Laterality Date  . Knee surgery      right knee 2011   . Ankle surgery      right ankle 2002 fracture  . Back surgery  1976    laminectomy  . Hernia repair  1981    "double hernia"  . Cataract extraction      bilateral  . Left heart catheterization with coronary angiogram N/A 04/16/2013    Procedure: LEFT HEART CATHETERIZATION WITH CORONARY ANGIOGRAM;  Surgeon: Leonie Man, MD;  Location: [facility] CATH LAB;  Service: Cardiovascular;  Laterality: N/A;  . Transthoracic echocardiogram  05/12/2010    EF =>55%.  MILD CONCENTRIC LVH. TRACE MITRAL REGURG.  . Nm myocar multiple w/spect  05/13/2012    MILD UPPER SEPTAL THINNING AND DIAPHRAGMATIC ATTENUATION W/O SIGNIFICANT ISCHEMIA. EF 66%.  . Cardiac catheterization      2005, stent placed DES LAD  . Coronary angioplasty      There were no vitals filed for this visit.  Visit Diagnosis:  Weakness generalized  Chronic bilateral low back pain without sciatica      Subjective Assessment - 04/19/15 1155    Subjective I am sleeping better. I have more energy than normal.    Pertinent History peripheral neuropathy   Limitations Standing   How long can you stand comfortably? 15 min.   Patient Stated Goals decrease pain and stiffness, be able to work in yard and stand without symptoms   Currently in Pain? No/denies            [facility] PT Assessment - 04/19/15 0001    Strength   Right Hip Flexion 3+/5   Right Hip ABduction 4+/5   Left Hip Flexion 4/5   Left Hip ABduction 4-/5                     [facility] Adult  PT Treatment/Exercise - 04/19/15 0001    Lumbar Exercises: Stretches   Active Hamstring Stretch 2 reps;30 seconds  seated   Lumbar Exercises: Aerobic   Stationary Bike 5 mins level 2   Lumbar Exercises: Supine   Bridge 10 reps  10x with knees in/out   Lumbar Exercises: Sidelying   Hip Abduction 10 reps  2 sets bilat   Knee/Hip Exercises: Standing   Wall Squat 10 reps;5 seconds  ball behind back   Rebounder marching wiht finger tips 1 min   Other Standing Knee Exercises resistive walking 5 x fwd, 5 x bkwd 20#   Shoulder Exercises: Standing   Other Standing Exercises modified push up with hands on mat 5 times   Shoulder Exercises: Pulleys   Other Pulley Exercises bil. shoulder extension 15# with tactile cues to retract scapula   Other Pulley Exercises bil. shoulder extension 15# 10 with tactile cues to extesnd thoracic                PT Education - 04/19/15 1225    Education provided No          PT  Short Term Goals - 04/14/15 1055    PT SHORT TERM GOAL #1   Title Pt will be independent in initial HEP   Time 4   Period Weeks   Status Achieved   PT SHORT TERM GOAL #2   Title Pt will improve hip strength to 4/5 all directions   Time 4   Period Weeks   Status On-going   PT SHORT TERM GOAL #3   Title Pt will tolerate standing x 15 minutes withotu increased pain   Time 4   Status Achieved           PT Long Term Goals - 04/19/15 1156    PT LONG TERM GOAL #1   Title Pt will improve FOTO to < = 40% to demo improved functional mobility   Time 8   Period Weeks   Status On-going   PT LONG TERM GOAL #2   Title Pt will improve hip strength to 4+/5 all directions   Time 8   Period Weeks   Status On-going   PT LONG TERM GOAL #3   Title Pt will be indepents with advanced HEP   Time 8   Period Weeks   Status On-going   PT LONG TERM GOAL #4   Title Pt will tolerate standing x 20 minutes without increased pain   Time 8   Period Weeks   Status On-going               Plan - 04/19/15 1220    Clinical Impression Statement Patient is a 81 year old male with diagnosis of low back ache.  Patient reports increased energy and stamina.  Patient hip strength right/left/5: flexion 3+/4/5 and abduction 4+/4-/5. 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  Patien thas difficulty holding himself upright due to weak back extensors. Patient would benefit from physical therapy to improve sttrength.    Pt will benefit from skilled therapeutic intervention in order to improve on the following deficits Decreased endurance;Decreased range of motion;Pain;Increased fascial restricitons;Impaired sensation;Impaired flexibility;Difficulty walking;Decreased strength;Decreased activity tolerance;Postural dysfunction   Rehab Potential Good   Clinical Impairments Affecting Rehab Potential None   PT Frequency 2x / week   PT Duration 8 weeks   PT Treatment/Interventions ADLs/Self Care Home  Management;Cryotherapy;Electrical Stimulation;Iontophoresis 4mg /ml Dexamethasone;Moist Heat;Taping;Patient/family education;Neuromuscular re-education;Therapeutic exercise;Therapeutic activities;Functional mobility training;Manual techniques;Passive range of motion  PT Next Visit Plan continue golf exercises, add therabnd to standing hip exercises   PT Home Exercise Plan progress as tolerated   Consulted and Agree with Plan of Care Patient        Problem List Patient Active Problem List   Diagnosis Date Noted  . Influenza A (H1N1) 08/07/2014  . Abnormality of gait 11/05/2013  . OSA (obstructive sleep apnea) 07/23/2013  . Erectile dysfunction 07/23/2013  . NSTEMI (non-ST elevated myocardial infarction) ([place]) 04/16/2013  . Anginal chest pain at rest [facility]) 04/15/2013  . CAD (coronary artery disease), Hx of LAD DES in 2005 04/15/2013  . Peripheral neuropathy ([place]) 04/15/2013  . Hyperlipidemia with target LDL less than 70 04/15/2013    Vu Liebman,PT 04/19/2015, 12:25 PM  [facility] [address], [place], [place], [address] Phone: [phone]   Fax:  [phone]  Name: Llewelyn Kalinoski. MRN: [id number] Date of Birth: [birth year]

## 2015-04-21 ENCOUNTER — Ambulatory Visit: Payer: Medicare Other | Admitting: Physical Therapy

## 2015-04-21 DIAGNOSIS — G8929 Other chronic pain: Secondary | ICD-10-CM | POA: Diagnosis not present

## 2015-04-21 DIAGNOSIS — R531 Weakness: Secondary | ICD-10-CM

## 2015-04-21 DIAGNOSIS — L821 Other seborrheic keratosis: Secondary | ICD-10-CM | POA: Diagnosis not present

## 2015-04-21 DIAGNOSIS — M545 Low back pain: Secondary | ICD-10-CM | POA: Diagnosis not present

## 2015-04-21 DIAGNOSIS — Z85828 Personal history of other malignant neoplasm of skin: Secondary | ICD-10-CM | POA: Diagnosis not present

## 2015-04-21 DIAGNOSIS — L570 Actinic keratosis: Secondary | ICD-10-CM | POA: Diagnosis not present

## 2015-04-21 DIAGNOSIS — D692 Other nonthrombocytopenic purpura: Secondary | ICD-10-CM | POA: Diagnosis not present

## 2015-04-21 DIAGNOSIS — D1801 Hemangioma of skin and subcutaneous tissue: Secondary | ICD-10-CM | POA: Diagnosis not present

## 2015-04-21 NOTE — Therapy (Signed)
[facility] [address] [place], [place], [address] Phone: [phone]   Fax:  [phone]  Physical Therapy Treatment  Patient Details  Name: Corey Larson. MRN: [id number] Date of Birth: 1927/10/28 Referring Provider: Dr. Thressa Sheller  Encounter Date: 04/21/2015      PT End of Session - 04/21/15 1054    Visit Number 9   Number of Visits 10   Date for PT Re-Evaluation 05/19/15   PT Start Time 1017   PT Stop Time 1055   PT Time Calculation (min) 38 min      Past Medical History  Diagnosis Date  . CAD (coronary artery disease) 2005    stent to LAD by Dr. Glade Lloyd. mild irregulatities of RCA.    . OSA (obstructive sleep apnea)     mild, never on cpap  . Hyperlipidemia LDL goal < 70   . H/O exercise stress test 05/2012    no statistically significant ischemia.   . Peripheral neuropathy ([place])   . Macular degeneration     bilateral  . Hypertension   . CRF (chronic renal failure)   . Insomnia   . ED (erectile dysfunction)   . Vitamin D deficiency   . Abnormality of gait 11/05/2013  . History of TIA (transient ischemic attack)     transient confusion  . Macular degeneration     Right>left  . HOH (hard of hearing)     hearing aids    Past Surgical History  Procedure Laterality Date  . Knee surgery      right knee 2011   . Ankle surgery      right ankle 2002 fracture  . Back surgery  1976    laminectomy  . Hernia repair  1981    "double hernia"  . Cataract extraction      bilateral  . Left heart catheterization with coronary angiogram N/A 04/16/2013    Procedure: LEFT HEART CATHETERIZATION WITH CORONARY ANGIOGRAM;  Surgeon: Leonie Man, MD;  Location: [facility] CATH LAB;  Service: Cardiovascular;  Laterality: N/A;  . Transthoracic echocardiogram  05/12/2010    EF =>55%. MILD CONCENTRIC LVH. TRACE MITRAL REGURG.  . Nm myocar multiple w/spect  05/13/2012    MILD UPPER SEPTAL THINNING AND  DIAPHRAGMATIC ATTENUATION W/O SIGNIFICANT ISCHEMIA. EF 66%.  . Cardiac catheterization      2005, stent placed DES LAD  . Coronary angioplasty      There were no vitals filed for this visit.  Visit Diagnosis:  Weakness generalized  Chronic bilateral low back pain without sciatica      Subjective Assessment - 04/21/15 1019    Subjective I guess I'm doing all right   Currently in Pain? No/denies                         [facility] Adult PT Treatment/Exercise - 04/21/15 0001    Lumbar Exercises: Stretches   Active Hamstring Stretch 2 reps;30 seconds  seated   Lumbar Exercises: Aerobic   Stationary Bike 5 minis level 2   Lumbar Exercises: Supine   Ab Set 10 reps  with marching   Bridge 10 reps  10 reps with kneees in/out   Knee/Hip Exercises: Standing   Rebounder marching x 1 min, fingertip hold   Other Standing Knee Exercises resisted walking 10 fwd, 10 bkwd 25#   Knee/Hip Exercises: Sidelying   Hip ABduction Strengthening;Both;2 sets;10 reps   Shoulder Exercises: Seated  Retraction Both;20 reps   Shoulder Exercises: Pulleys   Other Pulley Exercises bilat shld ext 20# cues for thoracic extension                  PT Short Term Goals - 04/21/15 1057    PT SHORT TERM GOAL #1   Title Pt will be independent in initial HEP   Status Achieved   PT SHORT TERM GOAL #2   Title Pt will improve hip strength to 4/5 all directions   Status On-going   PT SHORT TERM GOAL #3   Title Pt will tolerate standing x 15 minutes withotu increased pain   Status Achieved           PT Long Term Goals - 04/21/15 1058    PT LONG TERM GOAL #1   Title Pt will improve FOTO to < = 40% to demo improved functional mobility   Time 8   Period Weeks   Status On-going   PT LONG TERM GOAL #2   Title Pt will improve hip strength to 4+/5 all directions   Time 8   Period Weeks   Status On-going   PT LONG TERM GOAL #3   Title Pt will be indepents with advanced HEP   Time 8    Period Weeks   Status On-going   PT LONG TERM GOAL #4   Title Pt will tolerate standing x 20 minutes without increased pain   Time 8   Period Weeks   Status On-going               Plan - 04/21/15 1055    Clinical Impression Statement Pt says he feels stronger, still with decreased flexibility.  Pt will continue to benfeit from skilled PT for strength, posture, and endurance.   Pt will benefit from skilled therapeutic intervention in order to improve on the following deficits Decreased endurance;Decreased range of motion;Pain;Increased fascial restricitons;Impaired sensation;Impaired flexibility;Difficulty walking;Decreased strength;Decreased activity tolerance;Postural dysfunction   PT Frequency 2x / week   PT Duration 8 weeks   PT Treatment/Interventions ADLs/Self Care Home Management;Cryotherapy;Electrical Stimulation;Iontophoresis 4mg /ml Dexamethasone;Moist Heat;Taping;Patient/family education;Neuromuscular re-education;Therapeutic exercise;Therapeutic activities;Functional mobility training;Manual techniques;Passive range of motion   PT Next Visit Plan reassess goals, continue posture exercises        Problem List Patient Active Problem List   Diagnosis Date Noted  . Influenza A (H1N1) 08/07/2014  . Abnormality of gait 11/05/2013  . OSA (obstructive sleep apnea) 07/23/2013  . Erectile dysfunction 07/23/2013  . NSTEMI (non-ST elevated myocardial infarction) ([place]) 04/16/2013  . Anginal chest pain at rest [facility]) 04/15/2013  . CAD (coronary artery disease), Hx of LAD DES in 2005 04/15/2013  . Peripheral neuropathy ([place]) 04/15/2013  . Hyperlipidemia with target LDL less than 70 04/15/2013    Isabelle Course, PT, DPT  04/21/2015, 10:59 AM  [facility] [address], [place], [place], [address] Phone: [phone]   Fax:  [phone]  Name: Corey Larson. MRN: [id number] Date of Birth:  May 05, 1928

## 2015-04-26 ENCOUNTER — Encounter: Payer: Self-pay | Admitting: Physical Therapy

## 2015-04-26 ENCOUNTER — Ambulatory Visit: Payer: Medicare Other | Admitting: Physical Therapy

## 2015-04-26 DIAGNOSIS — R531 Weakness: Secondary | ICD-10-CM | POA: Diagnosis not present

## 2015-04-26 DIAGNOSIS — G8929 Other chronic pain: Secondary | ICD-10-CM

## 2015-04-26 DIAGNOSIS — M545 Low back pain: Secondary | ICD-10-CM

## 2015-04-26 NOTE — Therapy (Signed)
[facility] [address] [place], [place], [address] Phone: [phone]   Fax:  [phone]  Physical Therapy Treatment  Patient Details  Name: Corey Larson. MRN: [id number] Date of Birth: 08/18/1927 Referring Provider: Dr. Janit Bern  Encounter Date: 04/26/2015      PT End of Session - 04/26/15 1059    Visit Number 10   Number of Visits 20  Medicare   Date for PT Re-Evaluation 05/19/15   PT Start Time 0272   PT Stop Time 1100   PT Time Calculation (min) 45 min   Activity Tolerance Patient tolerated treatment well   Behavior During Therapy [facility] for tasks assessed/performed      Past Medical History  Diagnosis Date  . CAD (coronary artery disease) 2005    stent to LAD by Dr. Glade Lloyd. mild irregulatities of RCA.    . OSA (obstructive sleep apnea)     mild, never on cpap  . Hyperlipidemia LDL goal < 70   . H/O exercise stress test 05/2012    no statistically significant ischemia.   . Peripheral neuropathy ([place])   . Macular degeneration     bilateral  . Hypertension   . CRF (chronic renal failure)   . Insomnia   . ED (erectile dysfunction)   . Vitamin D deficiency   . Abnormality of gait 11/05/2013  . History of TIA (transient ischemic attack)     transient confusion  . Macular degeneration     Right>left  . HOH (hard of hearing)     hearing aids    Past Surgical History  Procedure Laterality Date  . Knee surgery      right knee 2011   . Ankle surgery      right ankle 2002 fracture  . Back surgery  1976    laminectomy  . Hernia repair  1981    "double hernia"  . Cataract extraction      bilateral  . Left heart catheterization with coronary angiogram N/A 04/16/2013    Procedure: LEFT HEART CATHETERIZATION WITH CORONARY ANGIOGRAM;  Surgeon: Leonie Man, MD;  Location: [facility] CATH LAB;  Service: Cardiovascular;  Laterality: N/A;  . Transthoracic echocardiogram  05/12/2010    EF  =>55%. MILD CONCENTRIC LVH. TRACE MITRAL REGURG.  . Nm myocar multiple w/spect  05/13/2012    MILD UPPER SEPTAL THINNING AND DIAPHRAGMATIC ATTENUATION W/O SIGNIFICANT ISCHEMIA. EF 66%.  . Cardiac catheterization      2005, stent placed DES LAD  . Coronary angioplasty      There were no vitals filed for this visit.  Visit Diagnosis:  Weakness generalized  Chronic bilateral low back pain without sciatica      Subjective Assessment - 04/26/15 1047    Subjective I feel 60% better.    Pertinent History peripheral neuropathy   How long can you stand comfortably? 15 min.   Patient Stated Goals decrease pain and stiffness, be able to work in yard and stand without symptoms   Currently in Pain? No/denies            [facility] PT Assessment - 04/26/15 0001    Assessment   Medical Diagnosis low back ache   Referring Provider Dr. Janit Bern   Onset Date/Surgical Date 02/03/15   Precautions   Precautions None   Restrictions   Weight Bearing Restrictions No   Balance Screen   Has the patient fallen in the past 6 months No   Has the  patient had a decrease in activity level because of a fear of falling?  No   Is the patient reluctant to leave their home because of a fear of falling?  No   Cognition   Overall Cognitive Status Within Functional Limits for tasks assessed   Observation/Other Assessments   Observations Thoracic kyphosis, pelvis in Rt rotation, Rt hip lower than Lt   Focus on Therapeutic Outcomes (FOTO)  45% limitation CK  goal is 40% limitation   Strength   Strength Assessment Site Hip   Right Hip Flexion 4/5   Right Hip Extension 5/5   Right Hip ABduction 5/5   Left Hip Flexion 5/5   Left Hip Extension 5/5   Left Hip ABduction 5/5                     [facility] Adult PT Treatment/Exercise - 04/26/15 0001    Lumbar Exercises: Stretches   Active Hamstring Stretch 2 reps;30 seconds  seated   Lumbar Exercises: Supine   Ab Set 10 reps  with marching    Bridge 10 reps  10 reps with kneees in/out   Knee/Hip Exercises: Standing   Wall Squat 10 reps;5 seconds  ball behind back   Rebounder marching x 1 min, fingertip hold   Knee/Hip Exercises: Sidelying   Hip ABduction Strengthening;Both;3 sets;10 reps   Shoulder Exercises: Pulleys   Other Pulley Exercises bilat shld ext 15# cues for thoracic extension   Other Pulley Exercises bil. shoulder extension 15# 10 with tactile cues to extesnd thoracic                PT Education - 04/26/15 1048    Education provided No          PT Short Term Goals - 04/26/15 1044    PT SHORT TERM GOAL #1   Title Pt will be independent in initial HEP   Time 4   Period Weeks   Status Achieved   PT SHORT TERM GOAL #2   Title Pt will improve hip strength to 4/5 all directions   Time 4   Period Weeks   Status Achieved   PT SHORT TERM GOAL #3   Title Pt will tolerate standing x 15 minutes withotu increased pain   Time 4   Period Weeks   Status Achieved           PT Long Term Goals - 04/26/15 1044    PT LONG TERM GOAL #1   Title Pt will improve FOTO to < = 40% to demo improved functional mobility   Time 8   Period Weeks   Status On-going  45% limitation   PT LONG TERM GOAL #2   Title Pt will improve hip strength to 4+/5 all directions   Time 8   Period Weeks   Status Achieved   PT LONG TERM GOAL #3   Title Pt will be indepents with advanced HEP   Time 8   Period Weeks   Status On-going  still learning   PT LONG TERM GOAL #4   Title Pt will tolerate standing x 20 minutes without increased pain   Time 8   Period Weeks   Status On-going               Plan - 04/26/15 1056    Clinical Impression Statement Patient has increased bilateral hip strength.  FOTO is 45% limitation but patient reports 60% better and 30% increaed in endurance. Patient has met  all of his STG's. Patient will benefit from physical therapy to improve strength and endurance.    Pt will benefit from  skilled therapeutic intervention in order to improve on the following deficits Decreased endurance;Decreased range of motion;Pain;Increased fascial restricitons;Impaired sensation;Impaired flexibility;Difficulty walking;Decreased strength;Decreased activity tolerance;Postural dysfunction   Rehab Potential Good   Clinical Impairments Affecting Rehab Potential None   PT Frequency 2x / week   PT Duration 8 weeks   PT Treatment/Interventions ADLs/Self Care Home Management;Cryotherapy;Electrical Stimulation;Iontophoresis 49m/ml Dexamethasone;Moist Heat;Taping;Patient/family education;Neuromuscular re-education;Therapeutic exercise;Therapeutic activities;Functional mobility training;Manual techniques;Passive range of motion   PT Next Visit Plan review HEP   PT Home Exercise Plan progress as tolerated   Consulted and Agree with Plan of Care Patient          G-Codes - 12016/12/111037    Functional Assessment Tool Used FOTO socre is 45% limitation   Functional Limitation Mobility: Walking and moving around   Mobility: Walking and Moving Around Current Status ([phone] At least 40 percent but less than 60 percent impaired, limited or restricted   Mobility: Walking and Moving Around Goal Status [phone] At least 40 percent but less than 60 percent impaired, limited or restricted      Problem List Patient Active Problem List   Diagnosis Date Noted  . Influenza A (H1N1) 08/07/2014  . Abnormality of gait 11/05/2013  . OSA (obstructive sleep apnea) 07/23/2013  . Erectile dysfunction 07/23/2013  . [place]STEMI (non[place]ST elevated [place] 04/16/[place]  . Anginal chest pain at rest ([facility] 04/15/2013  . CAD (coronary artery disease), Hx of LAD DES in 2005 04/15/2013  . Peripheral neuropathy ([place] 04/15/2013  . Hyperlipidemia with target LDL less than 70 04/15/2013    Derrin Currey,PT 112/04/2015 11:00 AM  [facility] [address] [place] [address]: [phone]  Fax:  [phone] Name: RCheron Pasquarelli MRN: [id number] of Birth: 107-30-1929  Physical Therapy Progress Note  Dates of Reporting Period: 03/24/2015 to 1Dec 11, 2016 Objective Reports of Subjective Statement: I feel 60% better.  Patient reports  20% increased endurance.   Objective Measurements: FOTO score is 45% limitation. Bilateral hip abduction and extension increased to 5/5.  Right hip flexion increased to 4/5 on right and left is 5/5.   Goal Update: Patient has not met LTG for  FOTO due to score 45% instead of 40% limitation. Patient can only stand for 15 minutes instead of 20 due to back pain and neuropathy of his feet.   Plan: Strengthening of trunk and legs and reduce back pain.  Reason Skilled Services are Required: to increase strength while monitor for back pain and use verbal cues to perform exercises correctly.   CEarlie Counts PT 1December 11, 201611:00 AM

## 2015-05-02 ENCOUNTER — Emergency Department (HOSPITAL_COMMUNITY)
Admission: EM | Admit: 2015-05-02 | Discharge: 2015-05-02 | Disposition: A | Discharge status: Home / Self Care | Payer: Medicare Other | Attending: Emergency Medicine | Admitting: Emergency Medicine

## 2015-05-02 ENCOUNTER — Emergency Department (HOSPITAL_COMMUNITY): Payer: Medicare Other

## 2015-05-02 ENCOUNTER — Encounter (HOSPITAL_COMMUNITY): Payer: Self-pay | Admitting: Emergency Medicine

## 2015-05-02 DIAGNOSIS — Z7902 Long term (current) use of antithrombotics/antiplatelets: Secondary | ICD-10-CM | POA: Diagnosis not present

## 2015-05-02 DIAGNOSIS — G629 Polyneuropathy, unspecified: Secondary | ICD-10-CM | POA: Diagnosis not present

## 2015-05-02 DIAGNOSIS — Z79899 Other long term (current) drug therapy: Secondary | ICD-10-CM | POA: Diagnosis not present

## 2015-05-02 DIAGNOSIS — Z87438 Personal history of other diseases of male genital organs: Secondary | ICD-10-CM | POA: Insufficient documentation

## 2015-05-02 DIAGNOSIS — E559 Vitamin D deficiency, unspecified: Secondary | ICD-10-CM | POA: Diagnosis not present

## 2015-05-02 DIAGNOSIS — E785 Hyperlipidemia, unspecified: Secondary | ICD-10-CM | POA: Insufficient documentation

## 2015-05-02 DIAGNOSIS — N189 Chronic kidney disease, unspecified: Secondary | ICD-10-CM | POA: Diagnosis not present

## 2015-05-02 DIAGNOSIS — I129 Hypertensive chronic kidney disease with stage 1 through stage 4 chronic kidney disease, or unspecified chronic kidney disease: Secondary | ICD-10-CM | POA: Diagnosis not present

## 2015-05-02 DIAGNOSIS — Z8673 Personal history of transient ischemic attack (TIA), and cerebral infarction without residual deficits: Secondary | ICD-10-CM | POA: Diagnosis not present

## 2015-05-02 DIAGNOSIS — K529 Noninfective gastroenteritis and colitis, unspecified: Secondary | ICD-10-CM | POA: Diagnosis not present

## 2015-05-02 DIAGNOSIS — H9190 Unspecified hearing loss, unspecified ear: Secondary | ICD-10-CM | POA: Insufficient documentation

## 2015-05-02 DIAGNOSIS — Z87891 Personal history of nicotine dependence: Secondary | ICD-10-CM | POA: Insufficient documentation

## 2015-05-02 DIAGNOSIS — Z7982 Long term (current) use of aspirin: Secondary | ICD-10-CM | POA: Insufficient documentation

## 2015-05-02 DIAGNOSIS — Z9889 Other specified postprocedural states: Secondary | ICD-10-CM | POA: Insufficient documentation

## 2015-05-02 DIAGNOSIS — G4700 Insomnia, unspecified: Secondary | ICD-10-CM | POA: Diagnosis not present

## 2015-05-02 DIAGNOSIS — R1031 Right lower quadrant pain: Secondary | ICD-10-CM | POA: Diagnosis not present

## 2015-05-02 LAB — CBC
HEMATOCRIT: 40.8 % (ref 39.0–52.0)
HEMOGLOBIN: 12.8 g/dL — AB (ref 13.0–17.0)
MCH: 26.1 pg (ref 26.0–34.0)
MCHC: 31.4 g/dL (ref 30.0–36.0)
MCV: 83.1 fL (ref 78.0–100.0)
Platelets: 183 10*3/uL (ref 150–400)
RBC: 4.91 MIL/uL (ref 4.22–5.81)
RDW: 18.6 % — ABNORMAL HIGH (ref 11.5–15.5)
WBC: 6 10*3/uL (ref 4.0–10.5)

## 2015-05-02 LAB — URINALYSIS, ROUTINE W REFLEX MICROSCOPIC
Bilirubin Urine: NEGATIVE
Glucose, UA: NEGATIVE mg/dL
Hgb urine dipstick: NEGATIVE
Ketones, ur: NEGATIVE mg/dL
LEUKOCYTES UA: NEGATIVE
Nitrite: NEGATIVE
Protein, ur: NEGATIVE mg/dL
SPECIFIC GRAVITY, URINE: 1.029 (ref 1.005–1.030)
pH: 6 (ref 5.0–8.0)

## 2015-05-02 LAB — COMPREHENSIVE METABOLIC PANEL
ALBUMIN: 3.8 g/dL (ref 3.5–5.0)
ALT: 14 U/L — ABNORMAL LOW (ref 17–63)
AST: 27 U/L (ref 15–41)
Alkaline Phosphatase: 47 U/L (ref 38–126)
Anion gap: 7 (ref 5–15)
BUN: 20 mg/dL (ref 6–20)
CO2: 23 mmol/L (ref 22–32)
Calcium: 9.1 mg/dL (ref 8.9–10.3)
Chloride: 109 mmol/L (ref 101–111)
Creatinine, Ser: 1.19 mg/dL (ref 0.61–1.24)
GFR calc non Af Amer: 54 mL/min — ABNORMAL LOW (ref >60)
GLUCOSE: 133 mg/dL — AB (ref 65–99)
POTASSIUM: 4.1 mmol/L (ref 3.5–5.1)
SODIUM: 139 mmol/L (ref 135–145)
TOTAL PROTEIN: 6.9 g/dL (ref 6.5–8.1)
Total Bilirubin: 1.1 mg/dL (ref 0.3–1.2)

## 2015-05-02 LAB — DIFFERENTIAL
BASOS ABS: 0 10*3/uL (ref 0.0–0.1)
Basophils Relative: 0 %
Eosinophils Absolute: 0.1 10*3/uL (ref 0.0–0.7)
Eosinophils Relative: 2 %
LYMPHS ABS: 1.6 10*3/uL (ref 0.7–4.0)
Lymphocytes Relative: 27 %
Monocytes Absolute: 0.7 10*3/uL (ref 0.1–1.0)
Monocytes Relative: 12 %
NEUTROS ABS: 3.5 10*3/uL (ref 1.7–7.7)
Neutrophils Relative %: 59 %

## 2015-05-02 LAB — LIPASE, BLOOD: Lipase: 38 U/L (ref 11–51)

## 2015-05-02 MED ORDER — IOHEXOL 300 MG/ML  SOLN
100.0000 mL | Freq: Once | INTRAMUSCULAR | Status: AC | PRN
  Administered 2015-05-02: 100 mL via INTRAVENOUS

## 2015-05-02 MED ORDER — CIPROFLOXACIN HCL 500 MG PO TABS: 500.0000 mg | ORAL_TABLET | Freq: Two times a day (BID) | ORAL | Status: DC

## 2015-05-02 MED ORDER — FENTANYL CITRATE (PF) 100 MCG/2ML IJ SOLN: 50.0000 ug | INTRAMUSCULAR | Status: DC | PRN

## 2015-05-02 MED ORDER — METRONIDAZOLE 500 MG PO TABS: 500.0000 mg | ORAL_TABLET | Freq: Three times a day (TID) | ORAL | Status: DC

## 2015-05-02 MED ORDER — HYDROCODONE-ACETAMINOPHEN 5-325 MG PO TABS: 1.0000 | ORAL_TABLET | Freq: Four times a day (QID) | ORAL | Status: DC | PRN

## 2015-05-02 MED ORDER — IOHEXOL 300 MG/ML  SOLN
50.0000 mL | Freq: Once | INTRAMUSCULAR | Status: AC | PRN
  Administered 2015-05-02: 50 mL via ORAL

## 2015-05-02 NOTE — ED Notes (Signed)
Pt does not want pain medicine at this time. Reminded he needs a urine sample for the second time.

## 2015-05-02 NOTE — ED Provider Notes (Signed)
CSN: [id number]     Arrival date & time 05/02/15  [id number] History   First MD Initiated Contact with Patient 05/02/15 [phone]     Chief Complaint  Patient presents with  . Abdominal Pain     (Consider location/radiation/quality/duration/timing/severity/associated sxs/prior Treatment) Patient is a 81 y.o. male presenting with abdominal pain. The history is provided by the patient.  Abdominal Pain Pain location:  RLQ Pain quality: sharp   Pain radiates to:  Does not radiate Pain severity:  Moderate Onset quality:  Gradual Duration:  2 days Timing:  Constant Progression:  Worsening Chronicity:  New Context: not previous surgeries, not suspicious food intake and not trauma   Relieved by:  Nothing Worsened by:  Nothing tried Ineffective treatments:  None tried Associated symptoms: no anorexia, no diarrhea, no fatigue, no fever, no hematochezia and no vomiting   Risk factors: being elderly     Past Medical History  Diagnosis Date  . CAD (coronary artery disease) 2005    stent to LAD by Dr. Glade Lloyd. mild irregulatities of RCA.    . OSA (obstructive sleep apnea)     mild, never on cpap  . Hyperlipidemia LDL goal < 70   . H/O exercise stress test 05/2012    no statistically significant ischemia.   . Peripheral neuropathy ([place])   . Macular degeneration     bilateral  . Hypertension   . CRF (chronic renal failure)   . Insomnia   . ED (erectile dysfunction)   . Vitamin D deficiency   . Abnormality of gait 11/05/2013  . History of TIA (transient ischemic attack)     transient confusion  . Macular degeneration     Right>left  . HOH (hard of hearing)     hearing aids   Past Surgical History  Procedure Laterality Date  . Knee surgery      right knee 2011   . Ankle surgery      right ankle 2002 fracture  . Back surgery  1976    laminectomy  . Hernia repair  1981    "double hernia"  . Cataract extraction      bilateral  . Left heart catheterization with coronary angiogram N/A  04/16/2013    Procedure: LEFT HEART CATHETERIZATION WITH CORONARY ANGIOGRAM;  Surgeon: Leonie Man, MD;  Location: [facility] CATH LAB;  Service: Cardiovascular;  Laterality: N/A;  . Transthoracic echocardiogram  05/12/2010    EF =>55%. MILD CONCENTRIC LVH. TRACE MITRAL REGURG.  . Nm myocar multiple w/spect  05/13/2012    MILD UPPER SEPTAL THINNING AND DIAPHRAGMATIC ATTENUATION W/O SIGNIFICANT ISCHEMIA. EF 66%.  . Cardiac catheterization      2005, stent placed DES LAD  . Coronary angioplasty     Family History  Problem Relation Age of Onset  . Diabetes Sister   . Alcohol abuse Brother   . Cancer Maternal Grandmother   . Heart disease Paternal Grandfather    Social History  Substance Use Topics  . Smoking status: Former Smoker -- 30 years    Types: Cigarettes, Pipe    Quit date: 04/16/1983  . Smokeless tobacco: Never Used  . Alcohol Use: Yes     Comment: rarely    Review of Systems  Constitutional: Negative for fever and fatigue.  Gastrointestinal: Positive for abdominal pain. Negative for vomiting, diarrhea, hematochezia and anorexia.  All other systems reviewed and are negative.     Allergies  Klonopin and Ambien  Home Medications   Prior to Admission medications  Medication Sig Start Date End Date Taking? Authorizing Provider  albuterol (PROVENTIL HFA;VENTOLIN HFA) 108 (90 BASE) MCG/ACT inhaler Inhale 2 puffs into the lungs every 6 (six) hours as needed for wheezing or shortness of breath. 08/09/14  Yes Donne Hazel, MD  aspirin EC 81 MG tablet Take 81 mg by mouth daily.   Yes Historical Provider, MD  B Complex-C (B-COMPLEX WITH VITAMIN C) tablet Take 1 tablet by mouth daily.   Yes Historical Provider, MD  calcium carbonate (TUMS - DOSED IN MG ELEMENTAL CALCIUM) 500 MG chewable tablet Chew 1 tablet by mouth as needed for indigestion or heartburn.   Yes Historical Provider, MD  Cholecalciferol (VITAMIN D PO) Take 1 tablet by mouth daily.   Yes Historical Provider, MD   clopidogrel (PLAVIX) 75 MG tablet Take 1 tablet (75 mg total) by mouth daily with breakfast. 05/03/14  Yes Troy Sine, MD  ferrous sulfate 325 (65 FE) MG tablet Take 325 mg by mouth daily.   Yes Historical Provider, MD  gabapentin (NEURONTIN) 800 MG tablet Take 800 mg by mouth 3 (three) times daily.   Yes Historical Provider, MD  nebivolol (BYSTOLIC) 10 MG tablet Take 5 mg by mouth at bedtime. Takes 1/2 tablet   Yes Historical Provider, MD  nitroGLYCERIN (NITROSTAT) 0.4 MG SL tablet Place 1 tablet (0.4 mg total) under the tongue every 5 (five) minutes x 3 doses as needed for chest pain. 04/26/14  Yes Troy Sine, MD  ranitidine (ZANTAC) 75 MG tablet Take 75 mg by mouth daily as needed for heartburn.   Yes Historical Provider, MD  simvastatin (ZOCOR) 40 MG tablet Take 20 mg by mouth at bedtime.   Yes Historical Provider, MD  amLODipine (NORVASC) 2.5 MG tablet TAKE ONE TABLET BY MOUTH ONE TIME DAILY  Patient not taking: Reported on 05/02/2015 08/11/14   Troy Sine, MD   BP 140/90 mmHg  Pulse 54  Temp(Src) 97.6 F (36.4 C) (Oral)  Resp 18  Ht 5\' 6"  (1.676 m)  Wt 172 lb (78.019 kg)  BMI 27.77 kg/m2  SpO2 100% Physical Exam  Constitutional: He is oriented to person, place, and time. He appears well-developed and well-nourished. No distress.  HENT:  Head: Normocephalic and atraumatic.  Eyes: Conjunctivae are normal.  Neck: Neck supple. No tracheal deviation present.  Cardiovascular: Normal rate and regular rhythm.   Pulmonary/Chest: Effort normal. No respiratory distress.  Abdominal: Soft. Normal appearance. He exhibits no distension. There is tenderness in the right lower quadrant. There is no rigidity, no rebound, no guarding and no tenderness at McBurney's point.  Neurological: He is alert and oriented to person, place, and time.  Skin: Skin is warm and dry.  Psychiatric: He has a normal mood and affect.    ED Course  Procedures (including critical care time) Labs  Review Labs Reviewed  COMPREHENSIVE METABOLIC PANEL - Abnormal; Notable for the following:    Glucose, Bld 133 (*)    ALT 14 (*)    GFR calc non Af Amer 54 (*)    All other components within normal limits  CBC - Abnormal; Notable for the following:    Hemoglobin 12.8 (*)    RDW 18.6 (*)    All other components within normal limits  LIPASE, BLOOD  URINALYSIS, ROUTINE W REFLEX MICROSCOPIC (NOT AT [facility])  DIFFERENTIAL    Imaging Review Ct Abdomen Pelvis W Contrast  05/02/2015  CLINICAL DATA:  Right lower quadrant pain EXAM: CT ABDOMEN AND PELVIS WITH CONTRAST  TECHNIQUE: Multidetector CT imaging of the abdomen and pelvis was performed using the standard protocol following bolus administration of intravenous contrast. CONTRAST:  63mL OMNIPAQUE IOHEXOL 300 MG/ML SOLN, 173mL OMNIPAQUE IOHEXOL 300 MG/ML SOLN COMPARISON:  None. FINDINGS: Lower chest: 4 mm nodule in the right middle lobe. Adjacent 3 mm right middle lobe nodule. Lung bases otherwise clear. Moderately large hiatal hernia Hepatobiliary: 11 mm low-density lesion left lobe liver compatible with a cyst. Gallbladder is contracted. Bile ducts nondilated. Pancreas: Negative Spleen: Negative Adrenals/Urinary Tract: Negative for renal obstruction. No mass lesion. 15 mm left renal cyst. 4 cm cyst anterior right TD presumably an extra headache right renal cyst. Stomach/Bowel: Moderately large hiatal hernia. Negative for bowel obstruction or ileus. Normal appendix Mild thickening of the cecum with mild edema in the fat surrounding the cecum. This may be related to acute infection. No diverticula are seen in the area. The colon is not opacified with contrast. There is a moderate amount of stool in the colon. Vascular/Lymphatic: Mild aortic atherosclerotic disease without aneurysm. IVC normal. Negative for lymphadenopathy. Reproductive: Moderate prostate enlargement. Urinary bladder wall appears normal without wall thickening Other: Negative for ascites  Musculoskeletal: Lumbar dextroscoliosis. Multilevel disc degeneration and spondylosis throughout the lumbar spine. Negative for fracture or mass lesion. Degenerative change in both hip joints. IMPRESSION: 3 mm and 4 mm nodules in the right middle lobe. Consider follow-up chest CT to evaluate for other lung nodules, particularly if the patient has a history of known malignancy. Mild edema in the cecum and surrounding fat. This could be due to typhlitis. No diverticula seen in the area. Normal appendix. Underlying cecal mass cannot be excluded. Close follow-up warranted to exclude mass lesion. Electronically Signed   By: Franchot Gallo M.D.   On: 05/02/2015 11:26   I have personally reviewed and evaluated these images and lab results as part of my medical decision-making.   EKG Interpretation None      MDM   Final diagnoses:  Colitis    81 y.o. male presents with RLQ abdominal pain since last night. No typical symptoms of appendicitis. CT concerning for inflammatory process vs mass at cecum. No appendiceal abnormality noted. No acute surgical indication. Discussed findings with GI o/c physician who recommended initiating cipro/flagyl for empiric treatment of infectious etiologies and will see Pt in [facility] for scheduling colonoscopy at a later date to evaluate for mass. Doubt typhlitis without immunosuppression, neutropenia, or toxic appearance. Pt afebrile, no significant lab abnormality, agrees with plan for empiric treatment and discharge. Plan to follow up with PCP as needed and return precautions discussed for worsening or new concerning symptoms.     Leo Grosser, MD 05/02/15 1900

## 2015-05-02 NOTE — ED Notes (Signed)
Blank note at 1236 charted in error on this Pt.

## 2015-05-02 NOTE — ED Notes (Signed)
Patient transported to CT 

## 2015-05-02 NOTE — ED Notes (Signed)
RN attempted to arrange transportation form [facility] to Pts home.  Pt does nto have a key to her home.  She lives with her daughter, named Marylyn Ishihara.  Pt does nto know her daughters last name or phone number.  Her son, Rema Fendt lives off of [address] in some town in [place].  Pt does not know what city or sons phone number.

## 2015-05-02 NOTE — Discharge Instructions (Signed)
°  Colitis °Colitis is inflammation of the colon. Colitis may last a short time (acute) or it may last a long time (chronic). °CAUSES °This condition may be caused by: °· Viruses. °· Bacteria. °· Reactions to medicine. °· Certain autoimmune diseases, such as Crohn disease or ulcerative colitis. °SYMPTOMS °Symptoms of this condition include: °· Diarrhea. °· Passing bloody or tarry stool. °· Pain. °· Fever. °· Vomiting. °· Tiredness (fatigue). °· Weight loss. °· Bloating. °· Sudden increase in abdominal pain. °· Having fewer bowel movements than usual. °DIAGNOSIS °This condition is diagnosed with a stool test or a blood test. You may also have other tests, including X-rays, a CT scan, or a colonoscopy. °TREATMENT °Treatment may include: °· Resting the bowel. This involves not eating or drinking for a period of time. °· Fluids that are given through an IV tube. °· Medicine for pain and diarrhea. °· Antibiotic medicines. °· Cortisone medicines. °· Surgery. °HOME CARE INSTRUCTIONS °Eating and Drinking °· Follow instructions from your health care provider about eating or drinking restrictions. °· Drink enough fluid to keep your urine clear or pale yellow. °· Work with a dietitian to determine which foods cause your condition to flare up. °· Avoid foods that cause flare-ups. °· Eat a well-balanced diet. °Medicines °· Take over-the-counter and prescription medicines only as told by your health care provider. °· If you were prescribed an antibiotic medicine, take it as told by your health care provider. Do not stop taking the antibiotic even if you start to feel better. °General Instructions °· Keep all follow-up visits as told by your health care provider. This is important. °SEEK MEDICAL CARE IF: °· Your symptoms do not go away. °· You develop new symptoms. °SEEK IMMEDIATE MEDICAL CARE IF: °· You have a fever that does not go away with treatment. °· You develop chills. °· You have extreme weakness, fainting, or  dehydration. °· You have repeated vomiting. °· You develop severe pain in your abdomen. °· You pass bloody or tarry stool. °  °This information is not intended to replace advice given to you by your health care provider. Make sure you discuss any questions you have with your health care provider. °  °Document Released: 06/28/2004 Document Revised: 02/09/2015 Document Reviewed: 09/13/2014 °Elsevier Interactive Patient Education ©2016 [employer] ° °

## 2015-05-02 NOTE — ED Notes (Signed)
Patient reports right lower abdominal pain since 20:30 last night. Pain is worse when taking a deep breath. Denies nausea, vomiting, diarrhea.

## 2015-05-05 ENCOUNTER — Ambulatory Visit: Payer: Self-pay | Admitting: Physical Therapy

## 2015-05-06 DIAGNOSIS — R1031 Right lower quadrant pain: Secondary | ICD-10-CM | POA: Diagnosis not present

## 2015-05-09 ENCOUNTER — Other Ambulatory Visit: Payer: Self-pay | Admitting: Cardiovascular Disease

## 2015-05-09 NOTE — Telephone Encounter (Signed)
REFILL 

## 2015-05-10 ENCOUNTER — Encounter: Payer: Self-pay | Admitting: Physical Therapy

## 2015-05-10 ENCOUNTER — Ambulatory Visit: Payer: Medicare Other | Attending: Internal Medicine | Admitting: Physical Therapy

## 2015-05-10 DIAGNOSIS — G8929 Other chronic pain: Secondary | ICD-10-CM | POA: Diagnosis not present

## 2015-05-10 DIAGNOSIS — M545 Low back pain: Secondary | ICD-10-CM | POA: Insufficient documentation

## 2015-05-10 DIAGNOSIS — R531 Weakness: Secondary | ICD-10-CM | POA: Diagnosis not present

## 2015-05-10 NOTE — Therapy (Signed)
[facility] [address], [place], [place], [address] Phone: [phone]   Fax:  [phone]  Physical Therapy Treatment  Patient Details  Name: Corey Larson. MRN: [id number] Date of Birth: 23-Apr-1928 Referring Provider: Dr. Janit Bern  Encounter Date: 05/10/2015      PT End of Session - 05/10/15 1139    Visit Number 11   Number of Visits 20  Medicare   Date for PT Re-Evaluation 05/19/15   PT Start Time 1100   PT Stop Time 1140   PT Time Calculation (min) 40 min   Activity Tolerance Patient tolerated treatment well   Behavior During Therapy [facility] for tasks assessed/performed      Past Medical History  Diagnosis Date  . CAD (coronary artery disease) 2005    stent to LAD by Dr. Glade Lloyd. mild irregulatities of RCA.    . OSA (obstructive sleep apnea)     mild, never on cpap  . Hyperlipidemia LDL goal < 70   . H/O exercise stress test 05/2012    no statistically significant ischemia.   . Peripheral neuropathy ([place])   . Macular degeneration     bilateral  . Hypertension   . CRF (chronic renal failure)   . Insomnia   . ED (erectile dysfunction)   . Vitamin D deficiency   . Abnormality of gait 11/05/2013  . History of TIA (transient ischemic attack)     transient confusion  . Macular degeneration     Right>left  . HOH (hard of hearing)     hearing aids    Past Surgical History  Procedure Laterality Date  . Knee surgery      right knee 2011   . Ankle surgery      right ankle 2002 fracture  . Back surgery  1976    laminectomy  . Hernia repair  1981    "double hernia"  . Cataract extraction      bilateral  . Left heart catheterization with coronary angiogram N/A 04/16/2013    Procedure: LEFT HEART CATHETERIZATION WITH CORONARY ANGIOGRAM;  Surgeon: Leonie Man, MD;  Location: [facility] CATH LAB;  Service: Cardiovascular;  Laterality: N/A;  . Transthoracic echocardiogram  05/12/2010    EF =>55%.  MILD CONCENTRIC LVH. TRACE MITRAL REGURG.  . Nm myocar multiple w/spect  05/13/2012    MILD UPPER SEPTAL THINNING AND DIAPHRAGMATIC ATTENUATION W/O SIGNIFICANT ISCHEMIA. EF 66%.  . Cardiac catheterization      2005, stent placed DES LAD  . Coronary angioplasty      There were no vitals filed for this visit.  Visit Diagnosis:  Weakness generalized  Chronic bilateral low back pain without sciatica      Subjective Assessment - 05/10/15 1106    Subjective I am having my colonoscopy on 05/24/2015.  I have something wrong with my colon.    Pertinent History peripheral neuropathy   Limitations Standing   How long can you stand comfortably? 15 min.   Patient Stated Goals decrease pain and stiffness, be able to work in yard and stand without symptoms   Currently in Pain? No/denies            [facility] PT Assessment - 05/10/15 0001    Strength   Right Hip Flexion 4/5                     [facility] Adult PT Treatment/Exercise - 05/10/15 0001    Lumbar Exercises: Stretches  Active Hamstring Stretch 2 reps;30 seconds  seated   Lumbar Exercises: Supine   Bridge 10 reps  marchnig   Lumbar Exercises: Quadruped   Single Arm Raise Left;Right;10 reps   Straight Leg Raise 10 reps  right, left 10   Knee/Hip Exercises: Standing   Wall Squat 10 reps;5 seconds  ball behind back   Knee/Hip Exercises: Sidelying   Hip ABduction Right;Left;2 sets;10 reps   Shoulder Exercises: Pulleys   Other Pulley Exercises bilat shld ext 15# cues for thoracic extension 1x10; 25# 2x10   Other Pulley Exercises bil. shoulder row at 90 degree 20# 2x10                PT Education - 05/10/15 1139    Education provided No          PT Short Term Goals - 04/26/15 1044    PT SHORT TERM GOAL #1   Title Pt will be independent in initial HEP   Time 4   Period Weeks   Status Achieved   PT SHORT TERM GOAL #2   Title Pt will improve hip strength to 4/5 all directions   Time 4   Period Weeks    Status Achieved   PT SHORT TERM GOAL #3   Title Pt will tolerate standing x 15 minutes withotu increased pain   Time 4   Period Weeks   Status Achieved           PT Long Term Goals - 05/10/15 1139    PT LONG TERM GOAL #1   Title Pt will improve FOTO to < = 40% to demo improved functional mobility   Time 8   Period Weeks   Status On-going   PT LONG TERM GOAL #2   Title Pt will improve hip strength to 4+/5 all directions   Time 8   Period Weeks   Status Achieved   PT LONG TERM GOAL #3   Title Pt will be indepents with advanced HEP   Time 8   Period Weeks   Status On-going   PT LONG TERM GOAL #4   Title Pt will tolerate standing x 20 minutes without increased pain   Time 8   Status On-going               Plan - 05/10/15 1140    Clinical Impression Statement Patient is working on strength and endurance.  Patient was fatiqued after therapy.  Patient continues to have 30% increase in endurance.  Patient is having a colonoscopy for an area in the colon the doctors want to check out.  Patient will benefit from physical therapy to imporve endurance and strength.    Pt will benefit from skilled therapeutic intervention in order to improve on the following deficits Decreased endurance;Decreased range of motion;Pain;Increased fascial restricitons;Impaired sensation;Impaired flexibility;Difficulty walking;Decreased strength;Decreased activity tolerance;Postural dysfunction   Rehab Potential Good   Clinical Impairments Affecting Rehab Potential None   PT Frequency 2x / week   PT Duration 8 weeks   PT Treatment/Interventions ADLs/Self Care Home Management;Cryotherapy;Electrical Stimulation;Iontophoresis 4mg /ml Dexamethasone;Moist Heat;Taping;Patient/family education;Neuromuscular re-education;Therapeutic exercise;Therapeutic activities;Functional mobility training;Manual techniques;Passive range of motion   PT Next Visit Plan review HEP   PT Home Exercise Plan progress as  tolerated   Consulted and Agree with Plan of Care Patient        Problem List Patient Active Problem List   Diagnosis Date Noted  . Influenza A (H1N1) 08/07/2014  . Abnormality of gait 11/05/2013  . OSA (obstructive  sleep apnea) 07/23/2013  . Erectile dysfunction 07/23/2013  . NSTEMI (non-ST elevated myocardial infarction) ([place]) 04/16/2013  . Anginal chest pain at rest [facility]) 04/15/2013  . CAD (coronary artery disease), Hx of LAD DES in 2005 04/15/2013  . Peripheral neuropathy ([place]) 04/15/2013  . Hyperlipidemia with target LDL less than 70 04/15/2013    Melissa Tomaselli,PT 05/10/2015, 11:42 AM  [place] [address], [place], [place], [address] Phone: [phone]   Fax:  [phone]  Name: Corey Larson. MRN: [id number] Date of Birth: [birth year]

## 2015-05-11 ENCOUNTER — Telehealth: Payer: Self-pay | Admitting: Cardiovascular Disease

## 2015-05-11 DIAGNOSIS — F17211 Nicotine dependence, cigarettes, in remission: Secondary | ICD-10-CM | POA: Diagnosis not present

## 2015-05-11 DIAGNOSIS — E785 Hyperlipidemia, unspecified: Secondary | ICD-10-CM | POA: Diagnosis not present

## 2015-05-11 DIAGNOSIS — N183 Chronic kidney disease, stage 3 (moderate): Secondary | ICD-10-CM | POA: Diagnosis not present

## 2015-05-11 DIAGNOSIS — I129 Hypertensive chronic kidney disease with stage 1 through stage 4 chronic kidney disease, or unspecified chronic kidney disease: Secondary | ICD-10-CM | POA: Diagnosis not present

## 2015-05-11 DIAGNOSIS — I209 Angina pectoris, unspecified: Secondary | ICD-10-CM | POA: Diagnosis not present

## 2015-05-11 NOTE — Telephone Encounter (Signed)
Pt needs clarence to stop his Plavix for 5 days before his colonoscopy.Please fax to [phone].

## 2015-05-11 NOTE — Telephone Encounter (Signed)
Returned call to patient no answer.Left message on personal voice mail will send message to [facility] for advice.Advised after reviewing chart you need follow up appointment with [facility] office visit 12/02/13.Advised to call office to schedule.

## 2015-05-12 ENCOUNTER — Encounter: Payer: Self-pay | Admitting: Physical Therapy

## 2015-05-12 ENCOUNTER — Ambulatory Visit: Payer: Medicare Other | Admitting: Physical Therapy

## 2015-05-12 DIAGNOSIS — R531 Weakness: Secondary | ICD-10-CM

## 2015-05-12 DIAGNOSIS — M545 Low back pain: Secondary | ICD-10-CM

## 2015-05-12 DIAGNOSIS — G8929 Other chronic pain: Secondary | ICD-10-CM | POA: Diagnosis not present

## 2015-05-12 NOTE — Telephone Encounter (Signed)
Note faxed to Amy at [facility] office at fax # [phone].

## 2015-05-12 NOTE — Therapy (Signed)
[facility] [address] [place], [place], [address] Phone: [phone]   Fax:  [phone]  Physical Therapy Treatment  Patient Details  Name: Corey Larson. MRN: [id number] Date of Birth: [birth year] Referring Provider: Dr. Janit Bern  Encounter Date: 05/12/2015      PT End of Session - 05/12/15 1221    Visit Number 12   Number of Visits 20  medicare   Date for PT Re-Evaluation 05/19/15   PT Start Time 1152   PT Stop Time 1233   PT Time Calculation (min) 41 min   Activity Tolerance Patient tolerated treatment well   Behavior During Therapy [facility] for tasks assessed/performed      Past Medical History  Diagnosis Date  . CAD (coronary artery disease) 2005    stent to LAD by Dr. Glade Lloyd. mild irregulatities of RCA.    . OSA (obstructive sleep apnea)     mild, never on cpap  . Hyperlipidemia LDL goal < 70   . H/O exercise stress test 05/2012    no statistically significant ischemia.   . Peripheral neuropathy ([place])   . Macular degeneration     bilateral  . Hypertension   . CRF (chronic renal failure)   . Insomnia   . ED (erectile dysfunction)   . Vitamin D deficiency   . Abnormality of gait 11/05/2013  . History of TIA (transient ischemic attack)     transient confusion  . Macular degeneration     Right>left  . HOH (hard of hearing)     hearing aids    Past Surgical History  Procedure Laterality Date  . Knee surgery      right knee 2011   . Ankle surgery      right ankle 2002 fracture  . Back surgery  1976    laminectomy  . Hernia repair  1981    "double hernia"  . Cataract extraction      bilateral  . Left heart catheterization with coronary angiogram N/A 04/16/2013    Procedure: LEFT HEART CATHETERIZATION WITH CORONARY ANGIOGRAM;  Surgeon: Leonie Man, MD;  Location: [facility] CATH LAB;  Service: Cardiovascular;  Laterality: N/A;  . Transthoracic echocardiogram  05/12/2010    EF =>55%.  MILD CONCENTRIC LVH. TRACE MITRAL REGURG.  . Nm myocar multiple w/spect  05/13/2012    MILD UPPER SEPTAL THINNING AND DIAPHRAGMATIC ATTENUATION W/O SIGNIFICANT ISCHEMIA. EF 66%.  . Cardiac catheterization      2005, stent placed DES LAD  . Coronary angioplasty      There were no vitals filed for this visit.  Visit Diagnosis:  Weakness generalized  Chronic bilateral low back pain without sciatica      Subjective Assessment - 05/12/15 1153    Subjective My stomach is bothering due to having to take medication for the colonoscopy   Pertinent History peripheral neuropathy   Limitations Standing   How long can you stand comfortably? 15 min.   Patient Stated Goals decrease pain and stiffness, be able to work in yard and stand without symptoms   Currently in Pain? No/denies                         [facility] Adult PT Treatment/Exercise - 05/12/15 0001    Lumbar Exercises: Supine   Bridge 10 reps  10x knee in/ot   Lumbar Exercises: Quadruped   Single Arm Raise Left;Right;10 reps   Straight Leg Raise 10 reps  right, left 10   Knee/Hip Exercises: Seated   Long Arc Quad Left;Right;3 sets;10 reps;Weights   Long Arc Quad Weight 5 lbs.   Ball Squeeze hold 10 sec 10x   Knee/Hip Exercises: Sidelying   Hip ABduction Right;Left;2 sets;10 reps   Shoulder Exercises: Pulleys   Other Pulley Exercises bilat shld ext 15# cues for thoracic extension 1x10; 25# 2x10   Other Pulley Exercises bil. shoulder row at 90 degree 20# 2x10                PT Education - 05/12/15 1236    Education provided No          PT Short Term Goals - 04/26/15 1044    PT SHORT TERM GOAL #1   Title Pt will be independent in initial HEP   Time 4   Period Weeks   Status Achieved   PT SHORT TERM GOAL #2   Title Pt will improve hip strength to 4/5 all directions   Time 4   Period Weeks   Status Achieved   PT SHORT TERM GOAL #3   Title Pt will tolerate standing x 15 minutes withotu  increased pain   Time 4   Period Weeks   Status Achieved           PT Long Term Goals - 05/10/15 1139    PT LONG TERM GOAL #1   Title Pt will improve FOTO to < = 40% to demo improved functional mobility   Time 8   Period Weeks   Status On-going   PT LONG TERM GOAL #2   Title Pt will improve hip strength to 4+/5 all directions   Time 8   Period Weeks   Status Achieved   PT LONG TERM GOAL #3   Title Pt will be indepents with advanced HEP   Time 8   Period Weeks   Status On-going   PT LONG TERM GOAL #4   Title Pt will tolerate standing x 20 minutes without increased pain   Time 8   Status On-going               Plan - 05/12/15 1222    Clinical Impression Statement Patient is continuing with strengthening exercises.  Patient needs time to rest.  Patient is working on back extension strength to stand upright. Patient is able to perform exercises correctly.  Patient is able to perform more exercises while in therapy.  Patient  will benefit form pphysical therapy to increase strength.    Pt will benefit from skilled therapeutic intervention in order to improve on the following deficits Decreased endurance;Decreased range of motion;Pain;Increased fascial restricitons;Impaired sensation;Impaired flexibility;Difficulty walking;Decreased strength;Decreased activity tolerance;Postural dysfunction   Rehab Potential Good   Clinical Impairments Affecting Rehab Potential None   PT Frequency 2x / week   PT Duration 8 weeks   PT Treatment/Interventions ADLs/Self Care Home Management;Cryotherapy;Electrical Stimulation;Iontophoresis 4mg /ml Dexamethasone;Moist Heat;Taping;Patient/family education;Neuromuscular re-education;Therapeutic exercise;Therapeutic activities;Functional mobility training;Manual techniques;Passive range of motion   PT Next Visit Plan LE strengtheing, back extensor strength, and UE strength.    PT Home Exercise Plan progress as tolerated   Consulted and Agree with  Plan of Care Patient        Problem List Patient Active Problem List   Diagnosis Date Noted  . Influenza A (H1N1) 08/07/2014  . Abnormality of gait 11/05/2013  . OSA (obstructive sleep apnea) 07/23/2013  . Erectile dysfunction 07/23/2013  . NSTEMI (non-ST elevated myocardial infarction) ([place]) 04/16/2013  . Anginal  chest pain at rest [facility]) 04/15/2013  . CAD (coronary artery disease), Hx of LAD DES in 2005 04/15/2013  . Peripheral neuropathy ([place]) 04/15/2013  . Hyperlipidemia with target LDL less than 70 04/15/2013    Jeriel Vivanco,PT 05/12/2015, 12:37 PM  [place] [facility] [address], [place], [place], [address] Phone: [phone]   Fax:  [phone]  Name: Corey Larson. MRN: [id number] Date of Birth: 13-Jun-1927

## 2015-05-12 NOTE — Telephone Encounter (Signed)
Ok to hold for colonoscopy

## 2015-05-13 ENCOUNTER — Telehealth: Payer: Self-pay | Admitting: *Deleted

## 2015-05-13 NOTE — Telephone Encounter (Signed)
Opened in error

## 2015-05-17 ENCOUNTER — Ambulatory Visit: Payer: Medicare Other | Admitting: Physical Therapy

## 2015-05-17 ENCOUNTER — Encounter: Payer: Self-pay | Admitting: Physical Therapy

## 2015-05-17 DIAGNOSIS — R531 Weakness: Secondary | ICD-10-CM | POA: Diagnosis not present

## 2015-05-17 DIAGNOSIS — M545 Low back pain: Secondary | ICD-10-CM | POA: Diagnosis not present

## 2015-05-17 DIAGNOSIS — G8929 Other chronic pain: Secondary | ICD-10-CM | POA: Diagnosis not present

## 2015-05-17 NOTE — Therapy (Signed)
[facility] [address], [place], [place], [address] Phone: [phone]   Fax:  [phone]  Physical Therapy Treatment  Patient Details  Name: Corey Larson. MRN: [id number] Date of Birth: June 20, 1927 Referring Provider: Dr. Janit Bern  Encounter Date: 05/17/2015      PT End of Session - 05/17/15 1154    Visit Number 13   Number of Visits 20  medicare   Date for PT Re-Evaluation 05/19/15   PT Start Time 1145   PT Stop Time 1225   PT Time Calculation (min) 40 min   Activity Tolerance Patient tolerated treatment well   Behavior During Therapy [facility] for tasks assessed/performed      Past Medical History  Diagnosis Date  . CAD (coronary artery disease) 2005    stent to LAD by Dr. Glade Lloyd. mild irregulatities of RCA.    . OSA (obstructive sleep apnea)     mild, never on cpap  . Hyperlipidemia LDL goal < 70   . H/O exercise stress test 05/2012    no statistically significant ischemia.   . Peripheral neuropathy ([place])   . Macular degeneration     bilateral  . Hypertension   . CRF (chronic renal failure)   . Insomnia   . ED (erectile dysfunction)   . Vitamin D deficiency   . Abnormality of gait 11/05/2013  . History of TIA (transient ischemic attack)     transient confusion  . Macular degeneration     Right>left  . HOH (hard of hearing)     hearing aids    Past Surgical History  Procedure Laterality Date  . Knee surgery      right knee 2011   . Ankle surgery      right ankle 2002 fracture  . Back surgery  1976    laminectomy  . Hernia repair  1981    "double hernia"  . Cataract extraction      bilateral  . Left heart catheterization with coronary angiogram N/A 04/16/2013    Procedure: LEFT HEART CATHETERIZATION WITH CORONARY ANGIOGRAM;  Surgeon: Leonie Man, MD;  Location: [facility] CATH LAB;  Service: Cardiovascular;  Laterality: N/A;  . Transthoracic echocardiogram  05/12/2010    EF  =>55%. MILD CONCENTRIC LVH. TRACE MITRAL REGURG.  . Nm myocar multiple w/spect  05/13/2012    MILD UPPER SEPTAL THINNING AND DIAPHRAGMATIC ATTENUATION W/O SIGNIFICANT ISCHEMIA. EF 66%.  . Cardiac catheterization      2005, stent placed DES LAD  . Coronary angioplasty      There were no vitals filed for this visit.  Visit Diagnosis:  Weakness generalized  Chronic bilateral low back pain without sciatica      Subjective Assessment - 05/17/15 1152    Subjective I am not feeling well today. My feet are cramping.    Pertinent History peripheral neuropathy   Limitations Standing   How long can you stand comfortably? 15 min.   Patient Stated Goals decrease pain and stiffness, be able to work in yard and stand without symptoms   Currently in Pain? Yes   Pain Score 8    Pain Location Foot   Pain Orientation Left   Pain Descriptors / Indicators Cramping   Pain Type Acute pain   Pain Onset Today   Pain Frequency Intermittent   Aggravating Factors  not sure   Pain Relieving Factors not sure   Multiple Pain Sites No  [facility] PT Assessment - 05/17/15 0001    Observation/Other Assessments   Focus on Therapeutic Outcomes (FOTO)  45% limitation CK                     [facility] Adult PT Treatment/Exercise - 05/17/15 0001    Lumbar Exercises: Supine   Bridge 20 reps   Lumbar Exercises: Sidelying   Clam 20 reps  bil.    Lumbar Exercises: Quadruped   Single Arm Raise Left;Right;10 reps   Straight Leg Raise 10 reps  right, left 10   Knee/Hip Exercises: Seated   Long Arc Quad Left;Right;3 sets;10 reps;Weights   Long Arc Quad Weight 5 lbs.   Knee/Hip Exercises: Sidelying   Hip ABduction Right;Left;2 sets;10 reps   Shoulder Exercises: Pulleys   Other Pulley Exercises bilat shld ext  25# 2x10   Other Pulley Exercises bil. shoulder row at 90 degree 25# 2x10                PT Education - 05/17/15 1224    Education provided No          PT Short Term  Goals - 04/26/15 1044    PT SHORT TERM GOAL #1   Title Pt will be independent in initial HEP   Time 4   Period Weeks   Status Achieved   PT SHORT TERM GOAL #2   Title Pt will improve hip strength to 4/5 all directions   Time 4   Period Weeks   Status Achieved   PT SHORT TERM GOAL #3   Title Pt will tolerate standing x 15 minutes withotu increased pain   Time 4   Period Weeks   Status Achieved           PT Long Term Goals - 05/10/15 1139    PT LONG TERM GOAL #1   Title Pt will improve FOTO to < = 40% to demo improved functional mobility   Time 8   Period Weeks   Status On-going   PT LONG TERM GOAL #2   Title Pt will improve hip strength to 4+/5 all directions   Time 8   Period Weeks   Status Achieved   PT LONG TERM GOAL #3   Title Pt will be indepents with advanced HEP   Time 8   Period Weeks   Status On-going   PT LONG TERM GOAL #4   Title Pt will tolerate standing x 20 minutes without increased pain   Time 8   Status On-going               Plan - 05/17/15 1215    Clinical Impression Statement Patient continues to need rest.  Patient is having cramping in feet.  Patient is nervous about his colonoscopy.  Patient continues to work on back strength.  Patient reports back pain is 75% better.  FOTO score is 45% limitation due to increased pain in feet. Patient benefits from therapy to incresae strength and endurance.    Rehab Potential Good   Clinical Impairments Affecting Rehab Potential None   PT Frequency 2x / week   PT Duration 8 weeks   PT Treatment/Interventions ADLs/Self Care Home Management;Cryotherapy;Electrical Stimulation;Iontophoresis 4mg /ml Dexamethasone;Moist Heat;Taping;Patient/family education;Neuromuscular re-education;Therapeutic exercise;Therapeutic activities;Functional mobility training;Manual techniques;Passive range of motion   PT Next Visit Plan discharge to HEP next visit   PT Home Exercise Plan progress as tolerated   Recommended  Other Services [facility]   Consulted and Agree with Plan of Care  Patient        Problem List Patient Active Problem List   Diagnosis Date Noted  . Influenza A (H1N1) 08/07/2014  . Abnormality of gait 11/05/2013  . OSA (obstructive sleep apnea) 07/23/2013  . Erectile dysfunction 07/23/2013  . NSTEMI (non-ST elevated myocardial infarction) ([place]) 04/16/2013  . Anginal chest pain at rest [facility]) 04/15/2013  . CAD (coronary artery disease), Hx of LAD DES in 2005 04/15/2013  . Peripheral neuropathy ([place]) 04/15/2013  . Hyperlipidemia with target LDL less than 70 04/15/2013   Earlie Counts, PT 05/17/2015 12:24 PM    [place] [facility] [address], [place], [place], [address] Phone: [phone]   Fax:  [phone]  Name: Corey Larson. MRN: [id number] Date of Birth: 06/01/1928

## 2015-05-19 ENCOUNTER — Encounter: Payer: Self-pay | Admitting: Physical Therapy

## 2015-05-19 ENCOUNTER — Ambulatory Visit: Payer: Medicare Other | Admitting: Physical Therapy

## 2015-05-19 DIAGNOSIS — G8929 Other chronic pain: Secondary | ICD-10-CM | POA: Diagnosis not present

## 2015-05-19 DIAGNOSIS — R531 Weakness: Secondary | ICD-10-CM

## 2015-05-19 DIAGNOSIS — M545 Low back pain, unspecified: Secondary | ICD-10-CM

## 2015-05-19 NOTE — Therapy (Signed)
[facility] [address], [place], [place], [address] Phone: [phone]   Fax:  [phone]  Physical Therapy Treatment  Patient Details  Name: Corey Larson. MRN: [id number] Date of Birth: February 10, 1928 Referring Provider: Dr. Thressa Sheller  Encounter Date: 05/19/2015      PT End of Session - 05/19/15 1106    Visit Number 14   Date for PT Re-Evaluation 05/19/15   PT Start Time 1100   PT Stop Time 1140   PT Time Calculation (min) 40 min   Activity Tolerance Patient tolerated treatment well   Behavior During Therapy [facility] for tasks assessed/performed      Past Medical History  Diagnosis Date  . CAD (coronary artery disease) 2005    stent to LAD by Dr. Glade Lloyd. mild irregulatities of RCA.    . OSA (obstructive sleep apnea)     mild, never on cpap  . Hyperlipidemia LDL goal < 70   . H/O exercise stress test 05/2012    no statistically significant ischemia.   . Peripheral neuropathy ([place])   . Macular degeneration     bilateral  . Hypertension   . CRF (chronic renal failure)   . Insomnia   . ED (erectile dysfunction)   . Vitamin D deficiency   . Abnormality of gait 11/05/2013  . History of TIA (transient ischemic attack)     transient confusion  . Macular degeneration     Right>left  . HOH (hard of hearing)     hearing aids    Past Surgical History  Procedure Laterality Date  . Knee surgery      right knee 2011   . Ankle surgery      right ankle 2002 fracture  . Back surgery  1976    laminectomy  . Hernia repair  1981    "double hernia"  . Cataract extraction      bilateral  . Left heart catheterization with coronary angiogram N/A 04/16/2013    Procedure: LEFT HEART CATHETERIZATION WITH CORONARY ANGIOGRAM;  Surgeon: Leonie Man, MD;  Location: [facility] CATH LAB;  Service: Cardiovascular;  Laterality: N/A;  . Transthoracic echocardiogram  05/12/2010    EF =>55%. MILD CONCENTRIC LVH. TRACE  MITRAL REGURG.  . Nm myocar multiple w/spect  05/13/2012    MILD UPPER SEPTAL THINNING AND DIAPHRAGMATIC ATTENUATION W/O SIGNIFICANT ISCHEMIA. EF 66%.  . Cardiac catheterization      2005, stent placed DES LAD  . Coronary angioplasty      There were no vitals filed for this visit.  Visit Diagnosis:  Weakness generalized  Chronic bilateral low back pain without sciatica      Subjective Assessment - 05/19/15 1105    Subjective I am tired today.    Pertinent History peripheral neuropathy   Limitations Standing   How long can you stand comfortably? 15 min.   Patient Stated Goals decrease pain and stiffness, be able to work in yard and stand without symptoms   Currently in Pain? No/denies            [facility] PT Assessment - 05/19/15 0001    Assessment   Medical Diagnosis low back ache   Referring Provider Dr. Thressa Sheller   Onset Date/Surgical Date 02/03/15   Precautions   Precautions None   Restrictions   Weight Bearing Restrictions No   Balance Screen   Has the patient fallen in the past 6 months No   Has the patient had a decrease  in activity level because of a fear of falling?  No   Is the patient reluctant to leave their home because of a fear of falling?  No   Prior Function   Level of Independence Independent   Cognition   Overall Cognitive Status Within Functional Limits for tasks assessed   Observation/Other Assessments   Focus on Therapeutic Outcomes (FOTO)  45% limitation CK   Strength   Strength Assessment Site Hip   Right Hip Flexion 5/5   Right Hip Extension 5/5   Right Hip ABduction 5/5   Left Hip Flexion 5/5   Left Hip Extension 5/5   Left Hip ABduction 5/5                     [facility] Adult PT Treatment/Exercise - 05/19/15 0001    Lumbar Exercises: Supine   Bridge 20 reps   Bridge Limitations bridge with knees in/out 15x   Lumbar Exercises: Sidelying   Clam 20 reps  bil.    Knee/Hip Exercises: Sidelying   Hip ABduction  Right;Left;2 sets;10 reps   Shoulder Exercises: Seated   Other Seated Exercises holding 1# Y movement 15x;    Shoulder Exercises: Pulleys   Other Pulley Exercises bilat shld ext  25# 2x10   Other Pulley Exercises bil. shoulder row at 90 degree 25# 2x10                PT Education - 05/19/15 1138    Education provided Yes   Education Details verbally reviewed HEP and patient verbally understands   Person(s) Educated Patient   Methods Explanation   Comprehension Verbalized understanding          PT Short Term Goals - 04/26/15 1044    PT SHORT TERM GOAL #1   Title Pt will be independent in initial HEP   Time 4   Period Weeks   Status Achieved   PT SHORT TERM GOAL #2   Title Pt will improve hip strength to 4/5 all directions   Time 4   Period Weeks   Status Achieved   PT SHORT TERM GOAL #3   Title Pt will tolerate standing x 15 minutes withotu increased pain   Time 4   Period Weeks   Status Achieved           PT Long Term Goals - 05/19/15 1110    PT LONG TERM GOAL #1   Title Pt will improve FOTO to < = 40% to demo improved functional mobility   Time 8   Period Weeks   Status Not Met  45% limitation   PT LONG TERM GOAL #2   Title Pt will improve hip strength to 4+/5 all directions   Time 8   Period Weeks   Status Achieved   PT LONG TERM GOAL #3   Title Pt will be indepents with advanced HEP   Time 8   Period Weeks   Status Achieved   PT LONG TERM GOAL #4   Title Pt will tolerate standing x 20 minutes without increased pain   Time 8   Period Weeks   Status Achieved               Plan - 05/19/15 1139    Clinical Impression Statement Patient has met all of his goals with exception of FOTO that is 45% lmitation.  Patient has increased bil. hip strength to 5/5. Patient able to stand for 20 minutes.  Patient has had increased  fatique lately but will have a colonoscopy next week to follow up  on some findings.    Pt will benefit from skilled  therapeutic intervention in order to improve on the following deficits Decreased endurance;Decreased range of motion;Pain;Increased fascial restricitons;Impaired sensation;Impaired flexibility;Difficulty walking;Decreased strength;Decreased activity tolerance;Postural dysfunction   Rehab Potential Good   Clinical Impairments Affecting Rehab Potential None   PT Frequency 2x / week   PT Duration 8 weeks   PT Treatment/Interventions ADLs/Self Care Home Management;Cryotherapy;Electrical Stimulation;Iontophoresis 44m/ml Dexamethasone;Moist Heat;Taping;Patient/family education;Neuromuscular re-education;Therapeutic exercise;Therapeutic activities;Functional mobility training;Manual techniques;Passive range of motion   PT Next Visit Plan Discharge to HEP   PT Home Exercise Plan Current HEP   Consulted and Agree with Plan of Care Patient          G-Codes - 112/19/20161140    Functional Assessment Tool Used FOTO score is 45% limitation   Functional Limitation Mobility: Walking and moving around   Mobility: Walking and Moving Around Goal Status [phone] At least 40 percent but less than 60 percent impaired, limited or restricted   Mobility: Walking and Moving Around Discharge Status [phone] At least 40 percent but less than 60 percent impaired, limited or restricted      Problem List Patient Active Problem List   Diagnosis Date Noted  . Influenza A (H1N1) 08/07/2014  . Abnormality of gait 11/05/2013  . OSA (obstructive sleep apnea) 07/23/2013  . Erectile dysfunction 07/23/2013  . NSTEMI (non-ST elevated myocardial infarction) ([place] 04/16/2013  . Anginal chest pain at rest ([facility] 04/15/2013  . CAD (coronary artery disease), Hx of LAD DES in 2005 04/15/2013  . Peripheral neuropathy ([place] 04/15/2013  . Hyperlipidemia with target LDL less than 70 04/15/2013    CEarlie Counts PT [id number]:[id number] AM   [facility] [address] [place] [address]Phone: [phone]  Fax:  [phone] Name: Corey Larson MRN: [id number] of Birth: 1December 21, 1929 PHYSICAL THERAPY DISCHARGE SUMMARY  Visits from Start of Care: 14  Current functional level related to goals / functional outcomes: See above.  Patient has not met FOTO goal due to FOTO is 45% limitation.    Remaining deficits: See above.    Education / Equipment: HEP Plan: Patient agrees to discharge.  Patient goals were met. Patient is being discharged due to meeting the stated rehab goals. Thank you for the referral[place]arlie [place] 12016-05-1910:41 AM   ?????

## 2015-05-23 DIAGNOSIS — Z7901 Long term (current) use of anticoagulants: Secondary | ICD-10-CM | POA: Diagnosis not present

## 2015-05-23 DIAGNOSIS — Z5181 Encounter for therapeutic drug level monitoring: Secondary | ICD-10-CM | POA: Diagnosis not present

## 2015-05-24 DIAGNOSIS — K5730 Diverticulosis of large intestine without perforation or abscess without bleeding: Secondary | ICD-10-CM | POA: Diagnosis not present

## 2015-05-24 DIAGNOSIS — R1031 Right lower quadrant pain: Secondary | ICD-10-CM | POA: Diagnosis not present

## 2015-05-24 DIAGNOSIS — D122 Benign neoplasm of ascending colon: Secondary | ICD-10-CM | POA: Diagnosis not present

## 2015-05-24 DIAGNOSIS — R935 Abnormal findings on diagnostic imaging of other abdominal regions, including retroperitoneum: Secondary | ICD-10-CM | POA: Diagnosis not present

## 2015-05-24 DIAGNOSIS — R933 Abnormal findings on diagnostic imaging of other parts of digestive tract: Secondary | ICD-10-CM | POA: Diagnosis not present

## 2015-05-24 DIAGNOSIS — R100 Acute abdomen: Secondary | ICD-10-CM | POA: Diagnosis not present

## 2015-05-24 DIAGNOSIS — K641 Second degree hemorrhoids: Secondary | ICD-10-CM | POA: Diagnosis not present

## 2015-07-05 DIAGNOSIS — H353123 Nonexudative age-related macular degeneration, left eye, advanced atrophic without subfoveal involvement: Secondary | ICD-10-CM | POA: Diagnosis not present

## 2015-07-05 DIAGNOSIS — H353212 Exudative age-related macular degeneration, right eye, with inactive choroidal neovascularization: Secondary | ICD-10-CM | POA: Diagnosis not present

## 2015-09-08 DIAGNOSIS — M1711 Unilateral primary osteoarthritis, right knee: Secondary | ICD-10-CM | POA: Diagnosis not present

## 2015-10-04 DIAGNOSIS — H903 Sensorineural hearing loss, bilateral: Secondary | ICD-10-CM | POA: Diagnosis not present

## 2015-10-06 DIAGNOSIS — H903 Sensorineural hearing loss, bilateral: Secondary | ICD-10-CM | POA: Diagnosis not present

## 2015-10-06 DIAGNOSIS — H6123 Impacted cerumen, bilateral: Secondary | ICD-10-CM | POA: Diagnosis not present

## 2015-10-07 DIAGNOSIS — M545 Low back pain: Secondary | ICD-10-CM | POA: Diagnosis not present

## 2015-10-07 DIAGNOSIS — M5136 Other intervertebral disc degeneration, lumbar region: Secondary | ICD-10-CM | POA: Diagnosis not present

## 2015-10-12 DIAGNOSIS — M47816 Spondylosis without myelopathy or radiculopathy, lumbar region: Secondary | ICD-10-CM | POA: Diagnosis not present

## 2015-10-19 DIAGNOSIS — E559 Vitamin D deficiency, unspecified: Secondary | ICD-10-CM | POA: Diagnosis not present

## 2015-10-19 DIAGNOSIS — M109 Gout, unspecified: Secondary | ICD-10-CM | POA: Diagnosis not present

## 2015-10-19 DIAGNOSIS — Z23 Encounter for immunization: Secondary | ICD-10-CM | POA: Diagnosis not present

## 2015-10-19 DIAGNOSIS — I129 Hypertensive chronic kidney disease with stage 1 through stage 4 chronic kidney disease, or unspecified chronic kidney disease: Secondary | ICD-10-CM | POA: Diagnosis not present

## 2015-10-19 DIAGNOSIS — Z125 Encounter for screening for malignant neoplasm of prostate: Secondary | ICD-10-CM | POA: Diagnosis not present

## 2015-10-19 DIAGNOSIS — E785 Hyperlipidemia, unspecified: Secondary | ICD-10-CM | POA: Diagnosis not present

## 2015-10-19 DIAGNOSIS — Z0000 Encounter for general adult medical examination without abnormal findings: Secondary | ICD-10-CM | POA: Diagnosis not present

## 2015-10-19 DIAGNOSIS — Z1389 Encounter for screening for other disorder: Secondary | ICD-10-CM | POA: Diagnosis not present

## 2015-11-03 DIAGNOSIS — Z961 Presence of intraocular lens: Secondary | ICD-10-CM | POA: Diagnosis not present

## 2015-11-03 DIAGNOSIS — H353212 Exudative age-related macular degeneration, right eye, with inactive choroidal neovascularization: Secondary | ICD-10-CM | POA: Diagnosis not present

## 2015-11-03 DIAGNOSIS — Z0100 Encounter for examination of eyes and vision without abnormal findings: Secondary | ICD-10-CM | POA: Diagnosis not present

## 2015-11-03 DIAGNOSIS — H26493 Other secondary cataract, bilateral: Secondary | ICD-10-CM | POA: Diagnosis not present

## 2015-11-10 DIAGNOSIS — I209 Angina pectoris, unspecified: Secondary | ICD-10-CM | POA: Diagnosis not present

## 2015-11-10 DIAGNOSIS — I129 Hypertensive chronic kidney disease with stage 1 through stage 4 chronic kidney disease, or unspecified chronic kidney disease: Secondary | ICD-10-CM | POA: Diagnosis not present

## 2015-11-10 DIAGNOSIS — F17211 Nicotine dependence, cigarettes, in remission: Secondary | ICD-10-CM | POA: Diagnosis not present

## 2015-11-10 DIAGNOSIS — N183 Chronic kidney disease, stage 3 (moderate): Secondary | ICD-10-CM | POA: Diagnosis not present

## 2015-11-10 DIAGNOSIS — E785 Hyperlipidemia, unspecified: Secondary | ICD-10-CM | POA: Diagnosis not present

## 2015-12-23 ENCOUNTER — Telehealth: Payer: Self-pay | Admitting: Cardiovascular Disease

## 2015-12-23 NOTE — Telephone Encounter (Signed)
Spoke with pt wife, for the last several weeks the pt has had occ SOB. She has noticed the SOB when walking up stairs, not when walking in the store. She reports no edema or orthopnea. No reports of chest pain, occ leg weakness. Pt has not been seen since 2015. Routine follow up scheduled with dr Claiborne Billings.

## 2015-12-23 NOTE — Telephone Encounter (Signed)
Pt have been real short of breath,thinks he might need to be seen,

## 2016-02-09 DIAGNOSIS — H353122 Nonexudative age-related macular degeneration, left eye, intermediate dry stage: Secondary | ICD-10-CM | POA: Diagnosis not present

## 2016-02-09 DIAGNOSIS — H353212 Exudative age-related macular degeneration, right eye, with inactive choroidal neovascularization: Secondary | ICD-10-CM | POA: Diagnosis not present

## 2016-03-14 ENCOUNTER — Ambulatory Visit (INDEPENDENT_AMBULATORY_CARE_PROVIDER_SITE_OTHER): Payer: Medicare Other | Admitting: Cardiovascular Disease

## 2016-03-14 ENCOUNTER — Encounter: Payer: Self-pay | Admitting: Cardiovascular Disease

## 2016-03-14 VITALS — BP 132/64 | HR 88 | Ht 66.0 in | Wt 172.0 lb

## 2016-03-14 DIAGNOSIS — I10 Essential (primary) hypertension: Secondary | ICD-10-CM | POA: Diagnosis not present

## 2016-03-14 DIAGNOSIS — I2510 Atherosclerotic heart disease of native coronary artery without angina pectoris: Secondary | ICD-10-CM

## 2016-03-14 DIAGNOSIS — N529 Male erectile dysfunction, unspecified: Secondary | ICD-10-CM | POA: Diagnosis not present

## 2016-03-14 DIAGNOSIS — R238 Other skin changes: Secondary | ICD-10-CM

## 2016-03-14 DIAGNOSIS — R233 Spontaneous ecchymoses: Secondary | ICD-10-CM

## 2016-03-14 NOTE — Patient Instructions (Signed)
Your physician has recommended you make the following change in your medication: STOP clopidogrel ( plavix)  Your physician wants you to follow-up in: 1 year or sooner if needed. You will receive a reminder letter in the mail two months in advance. If you don't receive a letter, please call our office to schedule the follow-up appointment.  If you need a refill on your cardiac medications before your next appointment, please call your pharmacy.

## 2016-03-16 DIAGNOSIS — Z23 Encounter for immunization: Secondary | ICD-10-CM | POA: Diagnosis not present

## 2016-03-16 NOTE — Progress Notes (Signed)
Patient ID: Corey Larson., male   DOB: Jan 06, 1928, 82 y.o.   MRN: [id number]   PCP: Dr. Trilby Drummer  HPI: Corey Larson. is a 82 y.o. male who presents to the office fo a 27 follow-up cardiology evaluation.  [name] has a history of  coronary artery disease and underwent stenting of a 95% focal LAD stenosis by Dr. Glade Lloyd in 2005. At that time he did have mild irregularities of his RCA. He has a history of mild obstructive sleep apnea due to his cardiovascular comorbidities was referred for CPAP titration but he never did get followup with getting a unit. He does have a history of hyperlipidemia, as well as some erectile dysfunction.  Apparently on April 15, 2013 he had been cutting the grass and noticed chest pressure. He ultimately presented to [facility] hospital and ruled in for a NSTEMI. Following day he underwent cardiac catheterization by Dr. Ellyn Hack and was found to have a widely patent LAD stent. There were minimal luminal irregularities in the LAD system. The circumflex vessel is normal. The RCA had an irregular 50-60% lesion the first bend of the vessel. Medical therapy was recommended. He was started on Plavix in addition to isosorbide mononitrate and saw [name] in followup in the office on 04/27/2013. He apparently had been scheduled for a nuclear stress test in December but this was not done.  [name] participated in cardiac rehabilitation. He denies any further episodes of chest pressure. He has been taking Bystolic 5 mg, simvastatin 20 mg in addition to aspirin and Plavix for dual antiplatelet therapy.  He stopped taking isosorbide mononitrate to his wishes to take medication for erectile function and had taken cialis. For this reason, he was started on low-dose amlodipine 2.5 mg.   He has a history of peripheral neuropathy and is on gabapentin.  Presently, he denies chest pain.  He denies palpitations.  He denies presyncope or syncope.  He complains of easy  bruisability.  He presents for evaluation.  Past Medical History:  Diagnosis Date  . Abnormality of gait 11/05/2013  . CAD (coronary artery disease) 2005   stent to LAD by Dr. Glade Lloyd. mild irregulatities of RCA.    [name] CRF (chronic renal failure)   . ED (erectile dysfunction)   . H/O exercise stress test 05/2012   no statistically significant ischemia.   [name] History of TIA (transient ischemic attack)    transient confusion  . HOH (hard of hearing)    hearing aids  . Hyperlipidemia LDL goal < 70   . Hypertension   . Insomnia   . Macular degeneration    bilateral  . Macular degeneration    Right>left  . OSA (obstructive sleep apnea)    mild, never on cpap  . Peripheral neuropathy ([place])   . Vitamin D deficiency     Past Surgical History:  Procedure Laterality Date  . ANKLE SURGERY     right ankle 2002 fracture  . BACK SURGERY  1976   laminectomy  . CARDIAC CATHETERIZATION     2005, stent placed DES LAD  . CATARACT EXTRACTION     bilateral  . CORONARY ANGIOPLASTY    . HERNIA REPAIR  1981   "double hernia"  . KNEE SURGERY     right knee 2011   . LEFT HEART CATHETERIZATION WITH CORONARY ANGIOGRAM N/A 04/16/2013   Procedure: LEFT HEART CATHETERIZATION WITH CORONARY ANGIOGRAM;  Surgeon: Leonie Man, MD;  Location: [facility] CATH LAB;  Service:  Cardiovascular;  Laterality: N/A;  . NM MYOCAR MULTIPLE W/SPECT  05/13/2012   MILD UPPER SEPTAL THINNING AND DIAPHRAGMATIC ATTENUATION W/O SIGNIFICANT ISCHEMIA. EF 66%.  . TRANSTHORACIC ECHOCARDIOGRAM  05/12/2010   EF =>55%. MILD CONCENTRIC LVH. TRACE MITRAL REGURG.    Allergies  Allergen Reactions  . Klonopin [Clonazepam] Other (See Comments)    Sleep walking episodes  . Ambien [Zolpidem] Other (See Comments)    Sleep walking episodes     Current Outpatient Prescriptions  Medication Sig Dispense Refill  . albuterol (PROVENTIL HFA;VENTOLIN HFA) 108 (90 BASE) MCG/ACT inhaler Inhale 2 puffs into the lungs every 6 (six) hours as needed  for wheezing or shortness of breath. 1 Inhaler 0  . amLODipine (NORVASC) 2.5 MG tablet TAKE ONE TABLET BY MOUTH ONE TIME DAILY  30 tablet 4  . aspirin EC 81 MG tablet Take 81 mg by mouth daily.    . B Complex-C (B-COMPLEX WITH VITAMIN C) tablet Take 1 tablet by mouth daily.    . calcium carbonate (TUMS - DOSED IN MG ELEMENTAL CALCIUM) 500 MG chewable tablet Chew 1 tablet by mouth as needed for indigestion or heartburn.    . Cholecalciferol (VITAMIN D PO) Take 1 tablet by mouth daily.    . ferrous sulfate 325 (65 FE) MG tablet Take 325 mg by mouth daily.    [name] gabapentin (NEURONTIN) 800 MG tablet Take 800 mg by mouth 3 (three) times daily.    [name] HYDROcodone-acetaminophen (NORCO/VICODIN) 5-325 MG tablet Take 1 tablet by mouth every 6 (six) hours as needed for moderate pain. 6 tablet 0  . nebivolol (BYSTOLIC) 10 MG tablet Take 5 mg by mouth at bedtime. Takes 1/2 tablet    . nitroGLYCERIN (NITROSTAT) 0.4 MG SL tablet Place 1 tablet (0.4 mg total) under the tongue every 5 (five) minutes x 3 doses as needed for chest pain. 25 tablet 12  . ranitidine (ZANTAC) 75 MG tablet Take 75 mg by mouth daily as needed for heartburn.    . simvastatin (ZOCOR) 40 MG tablet Take 20 mg by mouth at bedtime.     No current facility-administered medications for this visit.     Social History   Social History  . Marital status: Married    Spouse name: N/A  . Number of children: N/A  . Years of education: N/A   Occupational History  . Not on file.   Social History Main Topics  . Smoking status: Former Smoker    Years: 30.00    Types: Cigarettes, Pipe    Quit date: 04/16/1983  . Smokeless tobacco: Never Used  . Alcohol use Yes     Comment: rarely  . Drug use: No  . Sexual activity: Not on file   Other Topics Concern  . Not on file   Social History Narrative  . No narrative on file    Family History  Problem Relation Age of Onset  . Diabetes Sister   . Alcohol abuse Brother   . Cancer Maternal  Grandmother   . Heart disease Paternal Grandfather    ROS General: Negative; No fevers, chills, or night sweats;  HEENT: Positive for reduced hearing for which he uses hearing aids; No changes in vision, sinus congestion, difficulty swallowing Pulmonary: Negative; No cough, wheezing, shortness of breath, hemoptysis Cardiovascular: Negative; No chest pain, presyncope, syncope, palpatations GI: Negative; No nausea, vomiting, diarrhea, or abdominal pain GU: Positive for erectile dysfunction No dysuria, hematuria, or difficulty voiding Musculoskeletal: Negative; no myalgias, joint pain, or weakness  Hematologic/Oncology: Negative; no easy bruising, bleeding Endocrine: Negative; no heat/cold intolerance; no diabetes Neuro: Positive for peripheral neuropathy, and gait disturbance headaches Skin: Negative; No rashes or skin lesions Psychiatric: Negative; No behavioral problems, depression Sleep: Positive for mild sleep apnea, but does not use CPAP; no daytime sleepiness, hypersomnolence, bruxism, restless legs, hypnogognic hallucinations, no cataplexy Other comprehensive 14 point system review is negative.   PE BP 132/64   Pulse 88   Ht '5\' 6"'$  (1.676 m)   Wt 172 lb (78 kg)   BMI 27.76 kg/m    Wt Readings from Last 3 Encounters:  03/14/16 172 lb (78 kg)  05/02/15 172 lb (78 kg)  08/07/14 175 lb 7.8 oz (79.6 kg)   General: Alert, oriented, no distress.  Skin: normal turgor, no rashes HEENT: Normocephalic, atraumatic. Pupils round and reactive; sclera anicteric;no lid lag. Extraocular muscles intact;no xanthelasmas.  Hearing aids in place Nose without nasal septal hypertrophy Mouth/Parynx benign; Mallinpatti scale 3 Neck: No JVD, no carotid bruits; normal carotid upstroke Lungs: clear to ausculatation and percussion; no wheezing or rales Chest wall: no tenderness to palpitation Heart: RRR, s1 s2 normal; 1/6 systolic murmur; no diastolic murmur, rub thrills or heaves Abdomen: soft,  nontender; no hepatosplenomehaly, BS+; abdominal aorta nontender and not dilated by palpation. Back: no CVA tenderness Pulses 2+ Extremities: no clubbing cyanosis or edema, Homan's sign negative  Neurologic: grossly nonfocal; cranial nerves grossly normal. Psychologic: normal affect and mood.  ECG (independently read by me): Normal sinus rhythm at 88 bpm.  Nonspecific ST changes.  July 2015 ECG (independently read by me): Sinus rhythm at 64 with sinus arrhythmia.  Nonspecific T changes.  QTc interval 437 ms.  Prior February 2015 ECG (independently read by me): Sinus rhythm at 72 beats per minute. No ectopy.  LABS:  BMP Latest Ref Rng & Units 05/02/2015 08/09/2014 08/08/2014  Glucose 65 - 99 mg/dL 133(H) 103(H) 105(H)  BUN 6 - 20 mg/dL '20 14 17  '$ Creatinine 0.61 - 1.24 mg/dL 1.19 1.07 1.26  Sodium 135 - 145 mmol/L 139 137 138  Potassium 3.5 - 5.1 mmol/L 4.1 3.9 3.9  Chloride 101 - 111 mmol/L 109 112 109  CO2 22 - 32 mmol/L '23 19 22  '$ Calcium 8.9 - 10.3 mg/dL 9.1 7.8(L) 8.0(L)   Hepatic Function Latest Ref Rng & Units 05/02/2015 04/16/2013  Total Protein 6.5 - 8.1 g/dL 6.9 6.3  Albumin 3.5 - 5.0 g/dL 3.8 3.3(L)  AST 15 - 41 U/L 27 25  ALT 17 - 63 U/L 14(L) 12  Alk Phosphatase 38 - 126 U/L 47 47  Total Bilirubin 0.3 - 1.2 mg/dL 1.1 1.3(H)  Bilirubin, Direct 0.0 - 0.3 mg/dL - 0.2   CBC Latest Ref Rng & Units 05/02/2015 08/09/2014 08/08/2014  WBC 4.0 - 10.5 K/uL 6.0 6.1 5.7  Hemoglobin 13.0 - 17.0 g/dL 12.8(L) 9.9(L) 10.0(L)  Hematocrit 39.0 - 52.0 % 40.8 31.3(L) 31.7(L)  Platelets 150 - 400 K/uL 183 129(L) 130(L)   Lab Results  Component Value Date   MCV 83.1 05/02/2015   MCV 79.0 08/09/2014   MCV 80.5 08/08/2014   Lab Results  Component Value Date   TSH 1.012 04/15/2013   Lab Results  Component Value Date   HGBA1C 6.0 (H) 04/15/2013   Lipid Panel     Component Value Date/Time   CHOL 105 04/16/2013 0503   TRIG 65 04/16/2013 0503   HDL 42 04/16/2013 0503   CHOLHDL 2.5  04/16/2013 0503   VLDL 13 04/16/2013 0503  [place] 50 04/16/2013 0503   No results found.    ASSESSMENT AND PLAN: Mr. Jamyson Jirak is an 82 year old gentleman who is 12 years status post PCI to a high-grade 95% focal LAD stenosis in 2005. In November 2014 he suffered a small non-ST segment elevation MI and the culprit lesion was felt to be 50-60% which was not significantly obstructive and at that time medical therapy was recommended.  He has been on dual antiplatelet therapy with aspirin and Plavix.  His blood pressure today is controlled on the bystolic 5 mg, amlodipine 2.5 mg and he has taken Lasix 20 mg approximately 2-3 times per week as needed for leg swelling.  He has had issues with erectile dysfunction in the past and for this reason, is no longer taking nitrates and in the past has taken Viagra and cialis intermittently.  He admits to significant increased bruisability.  Since it is been 12 years since his stent was placed, with his age of 86.  I have suggested that he can try discontinuing Plavix.  He will continue aspirin 81 mg daily.  He is on simvastatin for hyperlipidemia with target LDL less than 70.  Dr. Aline August has checked blood work and I will try to obtain recent results for my review.  As long as he is stable, I will see him in one year for reevaluation.  Time spent: 25 minutes  Troy Sine, MD, [facility]  03/16/2016 7:29 PM

## 2016-05-04 DIAGNOSIS — I10 Essential (primary) hypertension: Secondary | ICD-10-CM | POA: Diagnosis not present

## 2016-05-04 DIAGNOSIS — M109 Gout, unspecified: Secondary | ICD-10-CM | POA: Diagnosis not present

## 2016-05-04 DIAGNOSIS — E559 Vitamin D deficiency, unspecified: Secondary | ICD-10-CM | POA: Diagnosis not present

## 2016-05-04 DIAGNOSIS — I129 Hypertensive chronic kidney disease with stage 1 through stage 4 chronic kidney disease, or unspecified chronic kidney disease: Secondary | ICD-10-CM | POA: Diagnosis not present

## 2016-05-04 DIAGNOSIS — E785 Hyperlipidemia, unspecified: Secondary | ICD-10-CM | POA: Diagnosis not present

## 2016-05-15 DIAGNOSIS — G4700 Insomnia, unspecified: Secondary | ICD-10-CM | POA: Diagnosis not present

## 2016-05-15 DIAGNOSIS — I10 Essential (primary) hypertension: Secondary | ICD-10-CM | POA: Diagnosis not present

## 2016-05-15 DIAGNOSIS — E7800 Pure hypercholesterolemia, unspecified: Secondary | ICD-10-CM | POA: Diagnosis not present

## 2016-05-15 DIAGNOSIS — E559 Vitamin D deficiency, unspecified: Secondary | ICD-10-CM | POA: Diagnosis not present

## 2016-06-06 DIAGNOSIS — M1711 Unilateral primary osteoarthritis, right knee: Secondary | ICD-10-CM | POA: Diagnosis not present

## 2016-06-14 DIAGNOSIS — H905 Unspecified sensorineural hearing loss: Secondary | ICD-10-CM | POA: Diagnosis not present

## 2016-06-14 DIAGNOSIS — H903 Sensorineural hearing loss, bilateral: Secondary | ICD-10-CM | POA: Diagnosis not present

## 2016-07-05 DIAGNOSIS — L7211 Pilar cyst: Secondary | ICD-10-CM | POA: Diagnosis not present

## 2016-07-05 DIAGNOSIS — Z85828 Personal history of other malignant neoplasm of skin: Secondary | ICD-10-CM | POA: Diagnosis not present

## 2016-07-05 DIAGNOSIS — L570 Actinic keratosis: Secondary | ICD-10-CM | POA: Diagnosis not present

## 2016-07-17 DIAGNOSIS — Z85828 Personal history of other malignant neoplasm of skin: Secondary | ICD-10-CM | POA: Diagnosis not present

## 2016-07-17 DIAGNOSIS — L7211 Pilar cyst: Secondary | ICD-10-CM | POA: Diagnosis not present

## 2016-07-27 DIAGNOSIS — M1711 Unilateral primary osteoarthritis, right knee: Secondary | ICD-10-CM | POA: Diagnosis not present

## 2016-08-09 DIAGNOSIS — H353221 Exudative age-related macular degeneration, left eye, with active choroidal neovascularization: Secondary | ICD-10-CM | POA: Diagnosis not present

## 2016-08-10 DIAGNOSIS — M1711 Unilateral primary osteoarthritis, right knee: Secondary | ICD-10-CM | POA: Diagnosis not present

## 2016-08-17 DIAGNOSIS — M1711 Unilateral primary osteoarthritis, right knee: Secondary | ICD-10-CM | POA: Diagnosis not present

## 2016-08-22 DIAGNOSIS — M1711 Unilateral primary osteoarthritis, right knee: Secondary | ICD-10-CM | POA: Diagnosis not present

## 2016-08-23 DIAGNOSIS — G609 Hereditary and idiopathic neuropathy, unspecified: Secondary | ICD-10-CM | POA: Diagnosis not present

## 2016-08-23 DIAGNOSIS — R29898 Other symptoms and signs involving the musculoskeletal system: Secondary | ICD-10-CM | POA: Diagnosis not present

## 2016-08-23 DIAGNOSIS — Z9181 History of falling: Secondary | ICD-10-CM | POA: Diagnosis not present

## 2016-08-23 DIAGNOSIS — R269 Unspecified abnormalities of gait and mobility: Secondary | ICD-10-CM | POA: Diagnosis not present

## 2016-08-30 ENCOUNTER — Ambulatory Visit: Payer: Medicare Other | Admitting: Podiatry

## 2016-09-05 ENCOUNTER — Ambulatory Visit (INDEPENDENT_AMBULATORY_CARE_PROVIDER_SITE_OTHER): Payer: Medicare Other | Admitting: Podiatry

## 2016-09-05 ENCOUNTER — Encounter: Payer: Self-pay | Admitting: Podiatry

## 2016-09-05 VITALS — Resp 16 | Ht 67.0 in | Wt 170.0 lb

## 2016-09-05 DIAGNOSIS — M79609 Pain in unspecified limb: Secondary | ICD-10-CM | POA: Diagnosis not present

## 2016-09-05 DIAGNOSIS — L603 Nail dystrophy: Secondary | ICD-10-CM

## 2016-09-05 DIAGNOSIS — Q828 Other specified congenital malformations of skin: Secondary | ICD-10-CM | POA: Diagnosis not present

## 2016-09-05 DIAGNOSIS — B351 Tinea unguium: Secondary | ICD-10-CM

## 2016-09-05 DIAGNOSIS — L608 Other nail disorders: Secondary | ICD-10-CM

## 2016-09-05 NOTE — Progress Notes (Signed)
   Subjective:    Patient ID: Corey Larson., male    DOB: 1928/05/06, 83 y.o.   MRN: [id number]  HPI    Review of Systems  Musculoskeletal: Positive for gait problem.  All other systems reviewed and are negative.      Objective:   Physical Exam        Assessment & Plan:

## 2016-09-07 ENCOUNTER — Ambulatory Visit: Payer: Medicare Other | Attending: Internal Medicine | Admitting: Rehabilitative and Restorative Service Providers"

## 2016-09-07 DIAGNOSIS — R2681 Unsteadiness on feet: Secondary | ICD-10-CM | POA: Insufficient documentation

## 2016-09-07 DIAGNOSIS — R262 Difficulty in walking, not elsewhere classified: Secondary | ICD-10-CM | POA: Diagnosis not present

## 2016-09-07 DIAGNOSIS — G8929 Other chronic pain: Secondary | ICD-10-CM | POA: Diagnosis not present

## 2016-09-07 DIAGNOSIS — R531 Weakness: Secondary | ICD-10-CM | POA: Diagnosis not present

## 2016-09-07 DIAGNOSIS — M545 Low back pain: Secondary | ICD-10-CM | POA: Diagnosis not present

## 2016-09-07 MED ORDER — NONFORMULARY OR COMPOUNDED ITEM: 3 refills | Status: AC

## 2016-09-07 NOTE — Therapy (Addendum)
[facility] [address] [place], [place], [address] Phone: [phone]   Fax:  [phone]  Physical Therapy Evaluation  Patient Details  Name: Corey Larson. MRN: [id number] Date of Birth: 03-21-1928 Referring Provider: Ashby Dawes  Encounter Date: 09/07/2016      PT End of Session - 09/07/16 1228    Visit Number 1   Number of Visits 18   Date for PT Re-Evaluation 10/19/16   PT Start Time 0488   PT Stop Time 1102   PT Time Calculation (min) 47 min   Activity Tolerance Patient tolerated treatment well   Behavior During Therapy [facility] for tasks assessed/performed      Past Medical History:  Diagnosis Date  . Abnormality of gait 11/05/2013  . CAD (coronary artery disease) 2005   stent to LAD by Dr. Glade Lloyd. mild irregulatities of RCA.    [name] CRF (chronic renal failure)   . ED (erectile dysfunction)   . H/O exercise stress test 05/2012   no statistically significant ischemia.   [name] History of TIA (transient ischemic attack)    transient confusion  . HOH (hard of hearing)    hearing aids  . Hyperlipidemia LDL goal < 70   . Hypertension   . Insomnia   . Macular degeneration    bilateral  . Macular degeneration    Right>left  . OSA (obstructive sleep apnea)    mild, never on cpap  . Peripheral neuropathy ([place])   . Vitamin D deficiency     Past Surgical History:  Procedure Laterality Date  . ANKLE SURGERY     right ankle 2002 fracture  . BACK SURGERY  1976   laminectomy  . CARDIAC CATHETERIZATION     2005, stent placed DES LAD  . CATARACT EXTRACTION     bilateral  . CORONARY ANGIOPLASTY    . HERNIA REPAIR  1981   "double hernia"  . KNEE SURGERY     right knee 2011   . LEFT HEART CATHETERIZATION WITH CORONARY ANGIOGRAM N/A 04/16/2013   Procedure: LEFT HEART CATHETERIZATION WITH CORONARY ANGIOGRAM;  Surgeon: Leonie Man, MD;  Location: [facility] CATH LAB;  Service: Cardiovascular;  Laterality: N/A;   . NM MYOCAR MULTIPLE W/SPECT  05/13/2012   MILD UPPER SEPTAL THINNING AND DIAPHRAGMATIC ATTENUATION W/O SIGNIFICANT ISCHEMIA. EF 66%.  . TRANSTHORACIC ECHOCARDIOGRAM  05/12/2010   EF =>55%. MILD CONCENTRIC LVH. TRACE MITRAL REGURG.    There were no vitals filed for this visit.       Subjective Assessment - 09/07/16 1205    Subjective Pt/friend report pt has had 2 falls in 2018 and balance is continuing to become more poor with significant decline beginning around November or Christmas of 2017. "I am very unsteady."   Patient is accompained by: Family member   Pertinent History Pt/friend report pt has had 2 falls in 2018 and balance is becoming more of an issue. Pt reluctant to use a RW. He has fallen at church due to decreased foot clearance and got up and fell in the middle of the night. Both parties report pt had a sudden decline. ? TIA. They are to consult with MD regarding due to suddenness and of physical and cognitive decline.   Limitations Standing;Walking   How long can you sit comfortably? > 1 hour   How long can you stand comfortably? < 5 minutes   How long can you walk comfortably? < 5 minutes   Diagnostic tests none  Patient Stated Goals "to not fall anymore."   Currently in Pain? Yes   Pain Score 3    Pain Location Knee   Pain Orientation Right   Pain Descriptors / Indicators Discomfort   Pain Type Chronic pain   Pain Radiating Towards none   Pain Onset More than a month ago   Pain Frequency Intermittent   Aggravating Factors  standing, walking   Pain Relieving Factors sitting   Effect of Pain on Daily Activities moderate; pt no longer walks around grocery store and waits in car   Multiple Pain Sites No            [facility] PT Assessment - 09/07/16 0001      Assessment   Medical Diagnosis Gait, balance   Referring Provider Ramachandran   Onset Date/Surgical Date --  Thanksgiving or Christmas 2017   Hand Dominance Right   Next MD Visit --  TBD      Precautions   Precautions --  falls risk; bil neuropathy     Restrictions   Weight Bearing Restrictions No     Balance Screen   Has the patient fallen in the past 6 months Yes   Has the patient had a decrease in activity level because of a fear of falling?  Yes   Is the patient reluctant to leave their home because of a fear of falling?  Yes     [place] --  lives w/caregiver (wife?); 2 store home w/bedroom 2nd floor     Prior Function   Level of Independence --  requires assist for high level activities due to balance     Cognition   Overall Cognitive Status Impaired/Different from baseline   Area of Impairment Following commands;Safety/judgement;Awareness;Problem solving   Problem Solving Slow processing;Decreased initiation;Requires verbal cues     Sensation   Light Touch Impaired by gross assessment     Functional Tests   Functional tests Squat     Squat   Comments --  very slow and unsteady, especially  upon return to standing     ROM / Strength   AROM / PROM / Strength Strength  pt with full knee ext bil but in WB R knee remains flexed     Strength   Overall Strength Comments R hip flex 3, knee ext 4-, knee flex 3-, DF 3-, hip ext 3+/5; L hip flex 4- and all others 4/5     Ambulation/Gait   Gait Comments pt amb with antaglic gait with bil knee flexion R > L with bobbing of R knee flexion indicative of quad weakness     Balance   Balance Assessed Yes     Standardized Balance Assessment   Standardized Balance Assessment Berg Balance Test     Berg Balance Test   Sit to Stand Able to stand using hands after several tries   Standing Unsupported Able to stand 2 minutes with supervision   Sitting with Back Unsupported but Feet Supported on Floor or Stool Able to sit safely and securely 2 minutes   Stand to Sit Controls descent by using hands   Transfers Able to transfer safely, definite need of hands   Standing Unsupported with Eyes  Closed Able to stand 3 seconds   Standing Ubsupported with Feet Together Needs help to attain position but able to stand for 30 seconds with feet together   From Standing, Reach Forward with Outstretched Arm Can reach forward >5 cm safely (2")  From Standing Position, Pick up Object from [place] to pick up shoe, needs supervision   From Standing Position, Turn to Look Behind Over each Shoulder Turn sideways only but maintains balance   Turn 360 Degrees Needs close supervision or verbal cueing   Standing Unsupported, Alternately Place Feet on Step/Stool Needs assistance to keep from falling or unable to try   Standing Unsupported, One Foot in [place] balance while stepping or standing   Standing on One Leg Unable to try or needs assist to prevent fall   Total Score 26   Berg comment: --  Recommend RW usage at all times     High Level Balance   High Level Balance Activites Turns   High Level Balance Comments --  all dynamic mobility is poor       G-codes: Mobility Category Current status: CL Goal status: CK  Sigurd Sos, PT 10/03/16 1:15 PM                     PT Education - 09/07/16 1217    Education provided Yes   Education Details after assessing pt amb to treatment, recommended pt use a RW due to R knee buckling. after BERG score, further recommended pt use a RW even though pt reluctant. Referred pt/friend to [facility] discount medical supply; discussed them also getting a reacher to decrease pt risk of LOB when picking up items from floor. Discussed presentation of potential TIA due to suddent onset of LOB and confusion later 2017. Pt/friend to followup with MD   Person(s) Educated Patient;Spouse   Methods Explanation   Comprehension Verbalized understanding          PT Short Term Goals - 09/07/16 1223      PT SHORT TERM GOAL #1   Title Pt will be independent in initial HEP   Baseline not initiated at eval   Time 3   Period Weeks   Status  New     PT SHORT TERM GOAL #2   Title Pt will perform 10 STS without UEs without LOB in between reps to assist with safer transfers at home   Baseline 2 reps   Time 3   Period Weeks   Status New     PT SHORT TERM GOAL #3   Title Pt will demo improved Berg to 30/56 for decreased falls risk   Baseline 26/56   Time 3   Period Weeks   Status New           PT Long Term Goals - 09/07/16 1224      PT LONG TERM GOAL #1   Title Pt will demo improved Berg to >/= 45/56 for decreased falls risk   Baseline 26/56 for high falls risk   Time 6   Period Weeks   Status New     PT LONG TERM GOAL #2   Title Pt will report not needing to assist R LE into car   Baseline unable   Time 6   Period Weeks   Status New     PT LONG TERM GOAL #3   Title Pt will demo improved bil LE strength >/= 1 MMT grade to assist with more stability with gait   Baseline R hip flex 3, knee ext 4-, knee flex 3-, DF 3-; L hip flex 4- and all others 4/5   Time 6   Period Weeks   Status New     PT LONG TERM GOAL #4  Title Pt will demo improved stability with ambulation with RW or least restrictive assistive device level/unlevel surfaces to assist with out of home outings   Baseline has not purchased RW   Time 6   Period Weeks   Status New          3x/wk for 6 weeks.  Interventions to include therapeutic exercise, therapeutic activities, gait training, neuro-re education, functional activity training.      Plan - 09/07/16 1220    Clinical Impression Statement Pt presents to PT with poor balance, LE weakness in WB, and decreased activity tolerance. Pt would benefit from PT for GT with RW (pt to purchase), LE strenthening and balance to decrease falls risk.      Patient will benefit from skilled therapeutic intervention in order to improve the following deficits and impairments:  Abnormal gait, Decreased activity tolerance, Decreased balance, Decreased cognition, Decreased endurance, Decreased  mobility, Decreased safety awareness, Decreased strength, Difficulty walking, Impaired sensation  Visit Diagnosis: Weakness generalized  Unsteadiness on feet  Difficulty in walking, not elsewhere classified     Problem List Patient Active Problem List   Diagnosis Date Noted  . Influenza A (H1N1) 08/07/2014  . Abnormality of gait 11/05/2013  . OSA (obstructive sleep apnea) 07/23/2013  . Erectile dysfunction 07/23/2013  . NSTEMI (non-ST elevated myocardial infarction) ([place]) 04/16/2013  . Anginal chest pain at rest [facility]) 04/15/2013  . CAD (coronary artery disease), Hx of LAD DES in 2005 04/15/2013  . Peripheral neuropathy ([place]) 04/15/2013  . Hyperlipidemia with target LDL less than 70 04/15/2013    ARTIS,Vonnie Ligman, PT 09/07/2016, 12:30 PM  [facility] [address], [place], [place], [address] Phone: [phone]   Fax:  [phone]  Name: Jarmaine Ehrler. MRN: [id number] Date of Birth: 08-Jun-1927

## 2016-09-08 NOTE — Progress Notes (Signed)
Patient ID: Corey Larson., male   DOB: [birth year], 83 y.o.   MRN: [id number]    SUBJECTIVE Patient  presents to office today complaining of elongated, thickened nails. He reports discoloration of the nail of the right great toe for the past 10 years. He states he wants all of his nails checked for nail fungus. Pain while ambulating in shoes. Patient is unable to trim their own nails. He denies any trauma.  OBJECTIVE General Patient is awake, alert, and oriented x 3 and in no acute distress. Derm Skin is dry and supple bilateral. Negative open lesions or macerations. Remaining integument unremarkable. Nails are tender, long, thickened and dystrophic with subungual debris, consistent with onychomycosis, right great toe. No signs of infection noted. Hyperkeratotic lesion present on the right sub-fifth metatarsal. Pain on palpation with a central nucleated core noted.   Vasc  DP and PT pedal pulses palpable bilaterally. Temperature gradient within normal limits.  Neuro Epicritic and protective threshold sensation diminished bilaterally.  Musculoskeletal Exam No symptomatic pedal deformities noted bilateral. Muscular strength within normal limits.  ASSESSMENT 1. Onychodystrophic nails 1-5 bilateral with hyperkeratosis of nails.  2. Onychomycosis of nail due to dermatophyte bilateral 3. Pain in foot bilateral 4. Porokeratosis right sub-fifth metatarsal  PLAN OF CARE 1. Patient evaluated today.  2. Instructed to maintain good pedal hygiene and foot care.  3. Mechanical debridement of nails 1-5 bilaterally performed using a nail nipper. Filed with dremel without incident.  4. Excisional debridement of  keratoic lesion using a chisel blade was performed without incident.  5. Treated area(s) with Salinocaine and dressed with light dressing. 6. Prescription from [place] for neuropathy pain cream 7. Return to clinic when necessary    Edrick Kins, DPM [facility]  Dr. Edrick Kins, [place]                                        [place], [place] [address]                Office [phone]  Fax [phone]

## 2016-09-11 ENCOUNTER — Ambulatory Visit: Payer: Medicare Other

## 2016-09-11 DIAGNOSIS — R2681 Unsteadiness on feet: Secondary | ICD-10-CM | POA: Diagnosis not present

## 2016-09-11 DIAGNOSIS — G8929 Other chronic pain: Secondary | ICD-10-CM | POA: Diagnosis not present

## 2016-09-11 DIAGNOSIS — M545 Low back pain, unspecified: Secondary | ICD-10-CM

## 2016-09-11 DIAGNOSIS — R531 Weakness: Secondary | ICD-10-CM

## 2016-09-11 DIAGNOSIS — R262 Difficulty in walking, not elsewhere classified: Secondary | ICD-10-CM

## 2016-09-11 NOTE — Therapy (Signed)
[facility] [address] [place], [place], [address] Phone: [phone]   Fax:  [phone]  Physical Therapy Treatment  Patient Details  Name: Corey Larson. MRN: [id number] Date of Birth: 1928/01/12 Referring Provider: Ashby Dawes  Encounter Date: 09/11/2016      PT End of Session - 09/11/16 1236    Visit Number 2   Number of Visits 10   Date for PT Re-Evaluation 10/19/16   PT Start Time 9326  pt late   PT Stop Time 1231   PT Time Calculation (min) 36 min   Activity Tolerance Patient tolerated treatment well   Behavior During Therapy [facility] for tasks assessed/performed      Past Medical History:  Diagnosis Date  . Abnormality of gait 11/05/2013  . CAD (coronary artery disease) 2005   stent to LAD by Dr. Glade Lloyd. mild irregulatities of RCA.    [name] CRF (chronic renal failure)   . ED (erectile dysfunction)   . H/O exercise stress test 05/2012   no statistically significant ischemia.   [name] History of TIA (transient ischemic attack)    transient confusion  . HOH (hard of hearing)    hearing aids  . Hyperlipidemia LDL goal < 70   . Hypertension   . Insomnia   . Macular degeneration    bilateral  . Macular degeneration    Right>left  . OSA (obstructive sleep apnea)    mild, never on cpap  . Peripheral neuropathy ([place])   . Vitamin D deficiency     Past Surgical History:  Procedure Laterality Date  . ANKLE SURGERY     right ankle 2002 fracture  . BACK SURGERY  1976   laminectomy  . CARDIAC CATHETERIZATION     2005, stent placed DES LAD  . CATARACT EXTRACTION     bilateral  . CORONARY ANGIOPLASTY    . HERNIA REPAIR  1981   "double hernia"  . KNEE SURGERY     right knee 2011   . LEFT HEART CATHETERIZATION WITH CORONARY ANGIOGRAM N/A 04/16/2013   Procedure: LEFT HEART CATHETERIZATION WITH CORONARY ANGIOGRAM;  Surgeon: Leonie Man, MD;  Location[facility] CATH LAB;  Service: Cardiovascular;   Laterality: N/A;  . NM MYOCAR MULTIPLE W/SPECT  05/13/2012   MILD UPPER SEPTAL THINNING AND DIAPHRAGMATIC ATTENUATION W/O SIGNIFICANT ISCHEMIA. EF 66%.  . TRANSTHORACIC ECHOCARDIOGRAM  05/12/2010   EF =>55%. MILD CONCENTRIC LVH. TRACE MITRAL REGURG.    There were no vitals filed for this visit.      Subjective Assessment - 09/11/16 1203    Subjective Pt bought 2 walkers.  One for upstairs and one for downstairs.  Pt has not been using the walkers much.     Currently in Pain? No/denies                         [facility] Adult PT Treatment/Exercise - 09/11/16 0001      Exercises   Exercises Knee/Hip;Lumbar     Lumbar Exercises: Seated   Long Arc Quad on Chair Strengthening;Both;2 sets;10 reps   Other Seated Lumbar Exercises shoulder flexion 2x10 1#      Knee/Hip Exercises: Aerobic   Nustep Level 1 x 8 minutes   slow pace     Knee/Hip Exercises: Seated   Ball Squeeze 20x with 5 second hold   Marching Strengthening;2 sets;10 reps   Abduction/Adduction  Strengthening;Both;2 sets;10 reps  PT Education - 09/11/16 1219    Education provided Yes   Education Details HEP: seated strength   Person(s) Educated Patient;Spouse   Methods Explanation;Handout   Comprehension Verbalized understanding;Returned demonstration          PT Short Term Goals - 09/11/16 1204      PT SHORT TERM GOAL #1   Title Pt will be independent in initial HEP   Baseline initiated today   Time 3   Period Weeks   Status On-going     PT SHORT TERM GOAL #2   Title Pt will perform 10 STS without UEs without LOB in between reps to assist with safer transfers at home   Time 3   Period Weeks   Status On-going           PT Long Term Goals - 09/07/16 1224      PT LONG TERM GOAL #1   Title Pt will demo improved Berg to >/= 45/56 for decreased falls risk   Baseline 26/56 for high falls risk   Time 6   Period Weeks   Status New     PT LONG TERM GOAL #2   Title  Pt will report not needing to assist R LE into car   Baseline unable   Time 6   Period Weeks   Status New     PT LONG TERM GOAL #3   Title Pt will demo improved bil LE strength >/= 1 MMT grade to assist with more stability with gait   Baseline R hip flex 3, knee ext 4-, knee flex 3-, DF 3-; L hip flex 4- and all others 4/5   Time 6   Period Weeks   Status New     PT LONG TERM GOAL #4   Title Pt will demo improved stability with ambulation with RW or least restrictive assistive device level/unlevel surfaces to assist with out of home outings   Baseline has not purchased RW   Time 6   Period Weeks   Status New               Plan - 09/11/16 1207    Clinical Impression Statement Pt purchased a walker for home and does not use it much and didn't bring it to PT today.  PT initiated HEP for strength today.  Pt with reduced endurance and balance/gait deficits. Pt will continue to benefit from skilled PT for strength, endurance and gait.     Rehab Potential Good   PT Frequency 3x / week   PT Duration 6 weeks   PT Treatment/Interventions ADLs/Self Care Home Management;Cryotherapy;Functional mobility training;Stair training;Gait training;Therapeutic activities;Therapeutic exercise;Balance training;Neuromuscular re-education;Patient/family education;Passive range of motion;Manual techniques;Energy conservation   PT Next Visit Plan Balance, strength, gait, endurance   Consulted and Agree with Plan of Care Patient;Family member/caregiver      Patient will benefit from skilled therapeutic intervention in order to improve the following deficits and impairments:  Abnormal gait, Decreased activity tolerance, Decreased balance, Decreased cognition, Decreased endurance, Decreased mobility, Decreased safety awareness, Decreased strength, Difficulty walking, Impaired sensation  Visit Diagnosis: Weakness generalized  Unsteadiness on feet  Difficulty in walking, not elsewhere  classified  Chronic bilateral low back pain without sciatica     Problem List Patient Active Problem List   Diagnosis Date Noted  . Influenza A (H1N1) 08/07/2014  . Abnormality of gait 11/05/2013  . OSA (obstructive sleep apnea) 07/23/2013  . Erectile dysfunction 07/23/2013  . NSTEMI (non-ST elevated myocardial infarction) ([place])  04/16/2013  . Anginal chest pain at rest [facility]) 04/15/2013  . CAD (coronary artery disease), Hx of LAD DES in 2005 04/15/2013  . Peripheral neuropathy ([place]) 04/15/2013  . Hyperlipidemia with target LDL less than 70 04/15/2013     Sigurd Sos, PT 09/11/16 12:52 PM   [facility] [address], [place], [place], [address] Phone: [phone]   Fax:  [phone]  Name: Corey Larson. MRN: [id number] Date of Birth: July 31, 1927

## 2016-09-11 NOTE — Patient Instructions (Addendum)
KNEE: Extension, Long Arc Quad (Weight)  Place weight around leg. Raise leg until knee is straight. Hold _5__ seconds. Use ___ lb weight. _10__ reps per set (each leg), 4-5__ sets per day, __7_ days per week  Adduction: Hip - Knees Together (Sitting)    Sit with towel roll between knees. Push knees together. Hold for _5__ seconds. Rest for ___ seconds. Repeat _10-20__ times. Do 5___ times a day.  Copyright  VHI. All rights reserved.   ABDUCTION: Sitting - Resistance Band (Active)    Sit with feet flat. Lift right leg slightly and, against yellow resistance band, draw it out to side. Complete __2_ sets of _10__ repetitions. Perform 4-5___ sessions per day.  Copyright  VHI. All rights reserved.   Knee Raise   Lift knee and then lower it. Repeat with other knee. Repeat _10__ times each leg. Do _4-5___ sessions per day.  http://gt2.exer.us/445   Copyright  VHI. All rights reserved.  Toe Up   Gently rise up on toes and back on heels. Repeat _20___ times. Do 4-5____ sessions per day.  [place] [address] [place], [place] [address] Phone # [phone] Fax [phone]

## 2016-09-12 DIAGNOSIS — R410 Disorientation, unspecified: Secondary | ICD-10-CM | POA: Diagnosis not present

## 2016-09-12 DIAGNOSIS — M7581 Other shoulder lesions, right shoulder: Secondary | ICD-10-CM | POA: Diagnosis not present

## 2016-09-12 DIAGNOSIS — M1711 Unilateral primary osteoarthritis, right knee: Secondary | ICD-10-CM | POA: Diagnosis not present

## 2016-09-12 NOTE — Addendum Note (Signed)
Addended by: Danie Binder on: 09/12/2016 09:12 AM   Modules accepted: Orders

## 2016-09-13 ENCOUNTER — Ambulatory Visit: Payer: Medicare Other | Admitting: Physical Therapy

## 2016-09-13 DIAGNOSIS — R531 Weakness: Secondary | ICD-10-CM | POA: Diagnosis not present

## 2016-09-13 DIAGNOSIS — H353221 Exudative age-related macular degeneration, left eye, with active choroidal neovascularization: Secondary | ICD-10-CM | POA: Diagnosis not present

## 2016-09-13 DIAGNOSIS — R262 Difficulty in walking, not elsewhere classified: Secondary | ICD-10-CM | POA: Diagnosis not present

## 2016-09-13 DIAGNOSIS — M545 Low back pain: Secondary | ICD-10-CM | POA: Diagnosis not present

## 2016-09-13 DIAGNOSIS — R2681 Unsteadiness on feet: Secondary | ICD-10-CM

## 2016-09-13 DIAGNOSIS — G8929 Other chronic pain: Secondary | ICD-10-CM | POA: Diagnosis not present

## 2016-09-13 NOTE — Therapy (Signed)
[facility] [address], [place], [place], [address] Phone: [phone]   Fax:  [phone]  Physical Therapy Treatment  Patient Details  Name: Corey Larson. MRN: [id number] Date of Birth: 1928/03/30 Referring Provider: Ashby Dawes  Encounter Date: 09/13/2016      PT End of Session - 09/13/16 1305    Visit Number 3   Number of Visits 10   Date for PT Re-Evaluation 10/19/16   PT Start Time 1497   PT Stop Time 1313   PT Time Calculation (min) 43 min   Activity Tolerance Patient tolerated treatment well      Past Medical History:  Diagnosis Date  . Abnormality of gait 11/05/2013  . CAD (coronary artery disease) 2005   stent to LAD by Dr. Glade Lloyd. mild irregulatities of RCA.    [name] CRF (chronic renal failure)   . ED (erectile dysfunction)   . H/O exercise stress test 05/2012   no statistically significant ischemia.   [name] History of TIA (transient ischemic attack)    transient confusion  . HOH (hard of hearing)    hearing aids  . Hyperlipidemia LDL goal < 70   . Hypertension   . Insomnia   . Macular degeneration    bilateral  . Macular degeneration    Right>left  . OSA (obstructive sleep apnea)    mild, never on cpap  . Peripheral neuropathy ([place])   . Vitamin D deficiency     Past Surgical History:  Procedure Laterality Date  . ANKLE SURGERY     right ankle 2002 fracture  . BACK SURGERY  1976   laminectomy  . CARDIAC CATHETERIZATION     2005, stent placed DES LAD  . CATARACT EXTRACTION     bilateral  . CORONARY ANGIOPLASTY    . HERNIA REPAIR  1981   "double hernia"  . KNEE SURGERY     right knee 2011   . LEFT HEART CATHETERIZATION WITH CORONARY ANGIOGRAM N/A 04/16/2013   Procedure: LEFT HEART CATHETERIZATION WITH CORONARY ANGIOGRAM;  Surgeon: Leonie Man, MD;  Location: [facility] CATH LAB;  Service: Cardiovascular;  Laterality: N/A;  . NM MYOCAR MULTIPLE W/SPECT  05/13/2012   MILD UPPER  SEPTAL THINNING AND DIAPHRAGMATIC ATTENUATION W/O SIGNIFICANT ISCHEMIA. EF 66%.  . TRANSTHORACIC ECHOCARDIOGRAM  05/12/2010   EF =>55%. MILD CONCENTRIC LVH. TRACE MITRAL REGURG.    There were no vitals filed for this visit.      Subjective Assessment - 09/13/16 1235    Subjective Patient presents without assistive device.  Drove himself to PT today.  Reports no difficulty following last session.  No real pain anywhere except my feet have some neuropathy in them so they always hurt some.     Currently in Pain? Yes   Pain Score 3    Pain Location Foot   Pain Orientation Right;Left   Pain Type Chronic pain                         [facility] Adult PT Treatment/Exercise - 09/13/16 0001      Therapeutic Activites    Therapeutic Activities Other Therapeutic Activities   Other Therapeutic Activities standing, walking, sit to stand; golf swing     Neuro Re-ed    Neuro Re-ed Details  weight shifting with and without UE movements, head turns, golf putt motions, chipping motion simulation     Lumbar Exercises: Stretches   Active Hamstring Stretch 5 reps  Active Hamstring Stretch Limitations seated with stool  with ankle pump     Lumbar Exercises: Seated   Other Seated Lumbar Exercises 2# red plyo ball chops     Knee/Hip Exercises: Aerobic   Nustep Level 1 x 10 minutes   slow pace     Knee/Hip Exercises: Standing   Heel Raises Both;10 reps   Hip ADduction AROM;Right;Left;10 reps   Hip Extension AROM;Right;Left;10 reps   Other Standing Knee Exercises step taps 10x   Other Standing Knee Exercises weight shifting 4 ways     Knee/Hip Exercises: Seated   Long Arc Quad Strengthening;Right;Left;10 reps   Long Arc Quad Limitations red band   Clamshell with TheraBand Red   Knee/Hip Flexion red band 10x right/left   Sit to [employer] 10 reps;without UE support  from high table                  PT Short Term Goals - 09/13/16 1352      PT SHORT TERM GOAL #1   Title  Pt will be independent in initial HEP   Time 3   Period Weeks   Status On-going     PT SHORT TERM GOAL #2   Title Pt will perform 10 STS without UEs without LOB in between reps to assist with safer transfers at home   Time 3   Period Weeks   Status On-going     PT SHORT TERM GOAL #3   Title Pt will demo improved Berg to 30/56 for decreased falls risk   Time 3   Period Weeks   Status On-going           PT Long Term Goals - 09/13/16 1353      PT LONG TERM GOAL #1   Title Pt will demo improved Berg to >/= 45/56 for decreased falls risk   Time 6   Period Weeks   Status On-going     PT LONG TERM GOAL #2   Title Pt will report not needing to assist R LE into car   Time 6   Period Weeks   Status On-going     PT LONG TERM GOAL #3   Title Pt will demo improved bil LE strength >/= 1 MMT grade to assist with more stability with gait   Time 6   Period Weeks   Status On-going     PT LONG TERM GOAL #4   Title Pt will demo improved stability with ambulation with RW or least restrictive assistive device level/unlevel surfaces to assist with out of home outings   Time 6   Period Weeks   Status On-going               Plan - 09/13/16 1305    Clinical Impression Statement The patient needs 2 sitting rest breaks secondary to reports of general fatigue.  No complaints of increased foot pain.  He needs moderate verbal cues to stay on task at times and supervison for safety in standing.  Difficulty shifting weight without using hip strategies.  Loss of balance with narrow base of support.     PT Next Visit Plan Balance, strength, gait, endurance      Patient will benefit from skilled therapeutic intervention in order to improve the following deficits and impairments:     Visit Diagnosis: Weakness generalized  Unsteadiness on feet  Difficulty in walking, not elsewhere classified  Chronic bilateral low back pain without sciatica     Problem List Patient  Active  Problem List   Diagnosis Date Noted  . Influenza A (H1N1) 08/07/2014  . Abnormality of gait 11/05/2013  . OSA (obstructive sleep apnea) 07/23/2013  . Erectile dysfunction 07/23/2013  . NSTEMI (non-ST elevated myocardial infarction) ([place]) 04/16/2013  . Anginal chest pain at rest [facility]) 04/15/2013  . CAD (coronary artery disease), Hx of LAD DES in 2005 04/15/2013  . Peripheral neuropathy ([place]) 04/15/2013  . Hyperlipidemia with target LDL less than 70 04/15/2013   Ruben Im, PT 09/13/16 1:55 PM Phone: [phone] Fax: [phone]  Alvera Singh 09/13/2016, 1:54 PM  [facility] [address], [place], [place], [address] Phone: [phone]   Fax:  [phone]  Name: Corey Larson. MRN: [id number] Date of Birth: September 05, 1927

## 2016-09-14 ENCOUNTER — Ambulatory Visit: Payer: Medicare Other | Admitting: Physical Therapy

## 2016-09-14 DIAGNOSIS — M545 Low back pain, unspecified: Secondary | ICD-10-CM

## 2016-09-14 DIAGNOSIS — R262 Difficulty in walking, not elsewhere classified: Secondary | ICD-10-CM | POA: Diagnosis not present

## 2016-09-14 DIAGNOSIS — R2681 Unsteadiness on feet: Secondary | ICD-10-CM

## 2016-09-14 DIAGNOSIS — G8929 Other chronic pain: Secondary | ICD-10-CM | POA: Diagnosis not present

## 2016-09-14 DIAGNOSIS — R531 Weakness: Secondary | ICD-10-CM | POA: Diagnosis not present

## 2016-09-14 DIAGNOSIS — R410 Disorientation, unspecified: Secondary | ICD-10-CM | POA: Diagnosis not present

## 2016-09-14 NOTE — Therapy (Signed)
[facility] [address] [place], [place], [address] Phone: [phone]   Fax:  [phone]  Physical Therapy Treatment  Patient Details  Name: Corey Larson. MRN: [id number] Date of Birth: 16-Sep-1927 Referring Provider: Ashby Dawes  Encounter Date: 09/14/2016      PT End of Session - 09/14/16 1049    Visit Number 4   Number of Visits 10   Date for PT Re-Evaluation 10/19/16   PT Start Time 3818   PT Stop Time 1056   PT Time Calculation (min) 42 min   Activity Tolerance Patient tolerated treatment well      Past Medical History:  Diagnosis Date  . Abnormality of gait 11/05/2013  . CAD (coronary artery disease) 2005   stent to LAD by Dr. Glade Lloyd. mild irregulatities of RCA.    [name] CRF (chronic renal failure)   . ED (erectile dysfunction)   . H/O exercise stress test 05/2012   no statistically significant ischemia.   [name] History of TIA (transient ischemic attack)    transient confusion  . HOH (hard of hearing)    hearing aids  . Hyperlipidemia LDL goal < 70   . Hypertension   . Insomnia   . Macular degeneration    bilateral  . Macular degeneration    Right>left  . OSA (obstructive sleep apnea)    mild, never on cpap  . Peripheral neuropathy ([place])   . Vitamin D deficiency     Past Surgical History:  Procedure Laterality Date  . ANKLE SURGERY     right ankle 2002 fracture  . BACK SURGERY  1976   laminectomy  . CARDIAC CATHETERIZATION     2005, stent placed DES LAD  . CATARACT EXTRACTION     bilateral  . CORONARY ANGIOPLASTY    . HERNIA REPAIR  1981   "double hernia"  . KNEE SURGERY     right knee 2011   . LEFT HEART CATHETERIZATION WITH CORONARY ANGIOGRAM N/A 04/16/2013   Procedure: LEFT HEART CATHETERIZATION WITH CORONARY ANGIOGRAM;  Surgeon: Leonie Man, MD;  Location: [facility] CATH LAB;  Service: Cardiovascular;  Laterality: N/A;  . NM MYOCAR MULTIPLE W/SPECT  05/13/2012   MILD UPPER  SEPTAL THINNING AND DIAPHRAGMATIC ATTENUATION W/O SIGNIFICANT ISCHEMIA. EF 66%.  . TRANSTHORACIC ECHOCARDIOGRAM  05/12/2010   EF =>55%. MILD CONCENTRIC LVH. TRACE MITRAL REGURG.    There were no vitals filed for this visit.      Subjective Assessment - 09/14/16 1016    Subjective Arrives without assistive device. Drove himself to PT.  I'm stiff this morning but no soreness or pain.  Encouraged him to use his walker for safety when coming to appointments.  I can already tell a difference in the few times I've been coming here--stronger and more energetic.     Currently in Pain? No/denies   Pain Score 0-No pain                         [facility] Adult PT Treatment/Exercise - 09/14/16 0001      Therapeutic Activites    Therapeutic Activities Other Therapeutic Activities   Other Therapeutic Activities standing, walking, sit to stand; golf swing     Neuro Re-ed    Neuro Re-ed Details  weight shifting with and without UE movements, head turns, golf putt motions, chipping motion simulation     Lumbar Exercises: Stretches   Active Hamstring Stretch 5 reps   Active  Hamstring Stretch Limitations seated with stool  with ankle pump     Lumbar Exercises: Seated   Sit to Stand Limitations push down on foam roll 10x   Other Seated Lumbar Exercises 2# red plyo ball chops     Knee/Hip Exercises: Aerobic   Nustep Level 2 x 10 minutes   While discussing response to treatment      Knee/Hip Exercises: Standing   Heel Raises Both;10 reps   Hip ADduction AROM;Right;Left;10 reps   Hip Extension AROM;Right;Left;10 reps   Other Standing Knee Exercises step taps 10x     Knee/Hip Exercises: Seated   Long Arc Quad Strengthening;Right;Left;10 reps   Long Arc Quad Weight 3 lbs.   Ball Squeeze 20x with 5 second hold   Clamshell with TheraBand Red   Knee/Hip Flexion red band 10x right/left   Hamstring Curl Strengthening;Right;Left;10 reps      Sit to stand from chair plus 2 blue Airex  cushions and no hands 10x            PT Short Term Goals - 09/14/16 1159      PT SHORT TERM GOAL #1   Title Pt will be independent in initial HEP   Time 3   Period Weeks   Status On-going     PT SHORT TERM GOAL #2   Title Pt will perform 10 STS without UEs without LOB in between reps to assist with safer transfers at home   Time 3   Period Weeks   Status On-going     PT SHORT TERM GOAL #3   Title Pt will demo improved Berg to 30/56 for decreased falls risk   Time 3   Period Weeks   Status On-going           PT Long Term Goals - 09/14/16 1159      PT LONG TERM GOAL #1   Title Pt will demo improved Berg to >/= 45/56 for decreased falls risk   Time 6   Period Weeks   Status On-going     PT LONG TERM GOAL #2   Title Pt will report not needing to assist R LE into car   Time 6   Period Weeks   Status On-going     PT LONG TERM GOAL #3   Title Pt will demo improved bil LE strength >/= 1 MMT grade to assist with more stability with gait   Time 6   Period Weeks   Status On-going     PT LONG TERM GOAL #4   Title Pt will demo improved stability with ambulation with RW or least restrictive assistive device level/unlevel surfaces to assist with out of home outings   Time 6   Period Weeks   Status On-going               Plan - 09/14/16 1050    Clinical Impression Statement Patient generally more forward flexed with standing and walking today.  He reports his shoulders are tired following standing exercises (leaning heavily on UEs for support.)  He continues to use hip strategy for weight shifting rather than ankles.  Needs moderate cues for upright posture in standing and close supervision for safety.  Some cues needed for loss of focus on task.     PT Next Visit Plan Balance, strength, gait, endurance      Patient will benefit from skilled therapeutic intervention in order to improve the following deficits and impairments:     Visit Diagnosis:  Weakness  generalized  Unsteadiness on feet  Difficulty in walking, not elsewhere classified  Chronic bilateral low back pain without sciatica     Problem List Patient Active Problem List   Diagnosis Date Noted  . Influenza A (H1N1) 08/07/2014  . Abnormality of gait 11/05/2013  . OSA (obstructive sleep apnea) 07/23/2013  . Erectile dysfunction 07/23/2013  . NSTEMI (non-ST elevated myocardial infarction) ([place]) 04/16/2013  . Anginal chest pain at rest [facility]) 04/15/2013  . CAD (coronary artery disease), Hx of LAD DES in 2005 04/15/2013  . Peripheral neuropathy ([place]) 04/15/2013  . Hyperlipidemia with target LDL less than 70 04/15/2013   Ruben Im, PT 09/14/16 12:03 PM Phone: [phone] Fax: [phone]  Alvera Singh 09/14/2016, 12:00 PM  [facility] [address], [place], [place], [address] Phone: [phone]   Fax:  [phone]  Name: Corey Larson. MRN: [id number] Date of Birth: 11/29/1927

## 2016-09-17 ENCOUNTER — Encounter: Payer: Self-pay | Admitting: Physical Therapy

## 2016-09-17 ENCOUNTER — Ambulatory Visit: Payer: Medicare Other | Admitting: Physical Therapy

## 2016-09-17 DIAGNOSIS — R531 Weakness: Secondary | ICD-10-CM

## 2016-09-17 DIAGNOSIS — G8929 Other chronic pain: Secondary | ICD-10-CM | POA: Diagnosis not present

## 2016-09-17 DIAGNOSIS — M545 Low back pain, unspecified: Secondary | ICD-10-CM

## 2016-09-17 DIAGNOSIS — R2681 Unsteadiness on feet: Secondary | ICD-10-CM

## 2016-09-17 DIAGNOSIS — R262 Difficulty in walking, not elsewhere classified: Secondary | ICD-10-CM

## 2016-09-17 NOTE — Therapy (Signed)
[facility] [address] [place], [place], [address] Phone: [phone]   Fax:  [phone]  Physical Therapy Treatment  Patient Details  Name: Corey Tiedt. MRN: [id number] Date of Birth: 04-02-1928 Referring Provider: Ashby Dawes  Encounter Date: 09/17/2016      PT End of Session - 09/17/16 1234    Visit Number 5   Number of Visits 10   Date for PT Re-Evaluation 10/19/16   PT Start Time 1232   PT Stop Time 1314   PT Time Calculation (min) 42 min   Activity Tolerance Patient tolerated treatment well   Behavior During Therapy [facility] for tasks assessed/performed      Past Medical History:  Diagnosis Date  . Abnormality of gait 11/05/2013  . CAD (coronary artery disease) 2005   stent to LAD by Dr. Glade Lloyd. mild irregulatities of RCA.    [name] CRF (chronic renal failure)   . ED (erectile dysfunction)   . H/O exercise stress test 05/2012   no statistically significant ischemia.   [name] History of TIA (transient ischemic attack)    transient confusion  . HOH (hard of hearing)    hearing aids  . Hyperlipidemia LDL goal < 70   . Hypertension   . Insomnia   . Macular degeneration    bilateral  . Macular degeneration    Right>left  . OSA (obstructive sleep apnea)    mild, never on cpap  . Peripheral neuropathy   . Vitamin D deficiency     Past Surgical History:  Procedure Laterality Date  . ANKLE SURGERY     right ankle 2002 fracture  . BACK SURGERY  1976   laminectomy  . CARDIAC CATHETERIZATION     2005, stent placed DES LAD  . CATARACT EXTRACTION     bilateral  . CORONARY ANGIOPLASTY    . HERNIA REPAIR  1981   "double hernia"  . KNEE SURGERY     right knee 2011   . LEFT HEART CATHETERIZATION WITH CORONARY ANGIOGRAM N/A 04/16/2013   Procedure: LEFT HEART CATHETERIZATION WITH CORONARY ANGIOGRAM;  Surgeon: Leonie Man, MD;  Location: [facility] CATH LAB;  Service: Cardiovascular;  Laterality: N/A;  . NM  MYOCAR MULTIPLE W/SPECT  05/13/2012   MILD UPPER SEPTAL THINNING AND DIAPHRAGMATIC ATTENUATION W/O SIGNIFICANT ISCHEMIA. EF 66%.  . TRANSTHORACIC ECHOCARDIOGRAM  05/12/2010   EF =>55%. MILD CONCENTRIC LVH. TRACE MITRAL REGURG.    There were no vitals filed for this visit.      Subjective Assessment - 09/17/16 1236    Subjective No device again coming into todays session. He reports he isn't moving great today and to be patient with him.    Currently in Pain? No/denies   Multiple Pain Sites No                         [facility] Adult PT Treatment/Exercise - 09/17/16 0001      Lumbar Exercises: Seated   Other Seated Lumbar Exercises 2# red plyo ball chops  10x each side     Knee/Hip Exercises: Aerobic   Nustep Level 2 x 10 minutes   slow pace     Knee/Hip Exercises: Standing   Heel Raises Both;10 reps   Hip ADduction Strengthening;Both;2 sets;10 reps   Hip Extension Stengthening;Both;2 sets;10 reps     Knee/Hip Exercises: Seated   Long Arc Quad Strengthening;Right;Left;2 sets;10 reps   Long Arc Quad Weight 3 lbs.  Ball Squeeze 25x   Clamshell with TheraBand --  25x red   Sit to Sand --  2x 5 wihtou UR on 1 Airex pad                  PT Short Term Goals - 09/17/16 1257      PT SHORT TERM GOAL #1   Title Pt will be independent in initial HEP   Time 3   Period Weeks   Status Achieved     PT SHORT TERM GOAL #2   Title Pt will perform 10 STS without UEs without LOB in between reps to assist with safer transfers at home   Time 3   Period Weeks   Status On-going           PT Long Term Goals - 09/14/16 1159      PT LONG TERM GOAL #1   Title Pt will demo improved Berg to >/= 45/56 for decreased falls risk   Time 6   Period Weeks   Status On-going     PT LONG TERM GOAL #2   Title Pt will report not needing to assist R LE into car   Time 6   Period Weeks   Status On-going     PT LONG TERM GOAL #3   Title Pt will demo improved bil LE  strength >/= 1 MMT grade to assist with more stability with gait   Time 6   Period Weeks   Status On-going     PT LONG TERM GOAL #4   Title Pt will demo improved stability with ambulation with RW or least restrictive assistive device level/unlevel surfaces to assist with out of home outings   Time 6   Period Weeks   Status On-going               Plan - 09/17/16 1235    Clinical Impression Statement Pt did ok with his exercises, he was just slow in performing them. Increased the sets to 2 in standing as the endurance seemed to challenge the pt without wearing him out.  He did require short rest breaks in between exercises due to fatigue Verbal cuing throughout the session for posture. pt reports he is doinghis home exercises on the floor at home. .    Rehab Potential Good   PT Frequency 3x / week   PT Duration 6 weeks   PT Treatment/Interventions ADLs/Self Care Home Management;Cryotherapy;Functional mobility training;Stair training;Gait training;Therapeutic activities;Therapeutic exercise;Balance training;Neuromuscular re-education;Patient/family education;Passive range of motion;Manual techniques;Energy conservation   PT Next Visit Plan Balance, strength, gait, endurance   Consulted and Agree with Plan of Care Patient      Patient will benefit from skilled therapeutic intervention in order to improve the following deficits and impairments:  Abnormal gait, Decreased activity tolerance, Decreased balance, Decreased cognition, Decreased endurance, Decreased mobility, Decreased safety awareness, Decreased strength, Difficulty walking, Impaired sensation  Visit Diagnosis: Weakness generalized  Unsteadiness on feet  Difficulty in walking, not elsewhere classified  Chronic bilateral low back pain without sciatica     Problem List Patient Active Problem List   Diagnosis Date Noted  . Influenza A (H1N1) 08/07/2014  . Abnormality of gait 11/05/2013  . OSA (obstructive sleep  apnea) 07/23/2013  . Erectile dysfunction 07/23/2013  . NSTEMI (non-ST elevated myocardial infarction) ([place]) 04/16/2013  . Anginal chest pain at rest [facility]) 04/15/2013  . CAD (coronary artery disease), Hx of LAD DES in 2005 04/15/2013  . Peripheral neuropathy 04/15/2013  .  Hyperlipidemia with target LDL less than 70 04/15/2013    Anouk Critzer, PTA 09/17/2016, 1:13 PM  [facility] [address], [place], [place], [address] Phone: [phone]   Fax:  [phone]  Name: Corey Larson. MRN: [id number] Date of Birth: 15-Feb-1928

## 2016-09-19 ENCOUNTER — Encounter: Payer: Self-pay | Admitting: Physical Therapy

## 2016-09-19 ENCOUNTER — Ambulatory Visit: Payer: Medicare Other | Admitting: Physical Therapy

## 2016-09-19 DIAGNOSIS — R262 Difficulty in walking, not elsewhere classified: Secondary | ICD-10-CM

## 2016-09-19 DIAGNOSIS — G8929 Other chronic pain: Secondary | ICD-10-CM

## 2016-09-19 DIAGNOSIS — R531 Weakness: Secondary | ICD-10-CM

## 2016-09-19 DIAGNOSIS — M545 Low back pain, unspecified: Secondary | ICD-10-CM

## 2016-09-19 DIAGNOSIS — R2681 Unsteadiness on feet: Secondary | ICD-10-CM

## 2016-09-19 NOTE — Therapy (Signed)
[facility] [address] [place], [place], [address] Phone: [phone]   Fax:  [phone]  Physical Therapy Treatment  Patient Details  Name: Corey Larson. MRN: [id number] Date of Birth: 21-Sep-1927 Referring Provider: Ashby Dawes  Encounter Date: 09/19/2016      PT End of Session - 09/19/16 1230    Visit Number 6   Number of Visits 10   Date for PT Re-Evaluation 10/19/16   PT Start Time 1223   PT Stop Time 1310   PT Time Calculation (min) 47 min   Activity Tolerance Patient tolerated treatment well   Behavior During Therapy [facility] for tasks assessed/performed      Past Medical History:  Diagnosis Date  . Abnormality of gait 11/05/2013  . CAD (coronary artery disease) 2005   stent to LAD by Dr. Glade Lloyd. mild irregulatities of RCA.    [name] CRF (chronic renal failure)   . ED (erectile dysfunction)   . H/O exercise stress test 05/2012   no statistically significant ischemia.   [name] History of TIA (transient ischemic attack)    transient confusion  . HOH (hard of hearing)    hearing aids  . Hyperlipidemia LDL goal < 70   . Hypertension   . Insomnia   . Macular degeneration    bilateral  . Macular degeneration    Right>left  . OSA (obstructive sleep apnea)    mild, never on cpap  . Peripheral neuropathy   . Vitamin D deficiency     Past Surgical History:  Procedure Laterality Date  . ANKLE SURGERY     right ankle 2002 fracture  . BACK SURGERY  1976   laminectomy  . CARDIAC CATHETERIZATION     2005, stent placed DES LAD  . CATARACT EXTRACTION     bilateral  . CORONARY ANGIOPLASTY    . HERNIA REPAIR  1981   "double hernia"  . KNEE SURGERY     right knee 2011   . LEFT HEART CATHETERIZATION WITH CORONARY ANGIOGRAM N/A 04/16/2013   Procedure: LEFT HEART CATHETERIZATION WITH CORONARY ANGIOGRAM;  Surgeon: Leonie Man, MD;  Location: [facility] CATH LAB;  Service: Cardiovascular;  Laterality: N/A;  . NM  MYOCAR MULTIPLE W/SPECT  05/13/2012   MILD UPPER SEPTAL THINNING AND DIAPHRAGMATIC ATTENUATION W/O SIGNIFICANT ISCHEMIA. EF 66%.  . TRANSTHORACIC ECHOCARDIOGRAM  05/12/2010   EF =>55%. MILD CONCENTRIC LVH. TRACE MITRAL REGURG.    There were no vitals filed for this visit.      Subjective Assessment - 09/19/16 1231    Subjective I felt my workout, it was tough but good.    Currently in Pain? No/denies  Very stiff in his general movements today   Multiple Pain Sites No                         [facility] Adult PT Treatment/Exercise - 09/19/16 0001      Lumbar Exercises: Stretches   Active Hamstring Stretch 5 reps;10 seconds   Active Hamstring Stretch Limitations seated on black mat     Knee/Hip Exercises: Aerobic   Nustep Level 2 x 10 minutes   slow pace     Knee/Hip Exercises: Standing   Heel Raises Both;1 set;10 reps   Hip ADduction Strengthening;Both;2 sets;10 reps   Hip Extension Stengthening;Both;2 sets;10 reps     Knee/Hip Exercises: Seated   Long Arc Quad Strengthening;Right;Left;2 sets;10 reps   Long Arc Quad Weight 3 lbs.  Ball Squeeze 25x   Clamshell with TheraBand --  25x red   Abd/Adduction Weights --  Seated rocker board 3 min: VC to get pt tuned into ankles   Sit to Sand 2 sets;5 reps;without UE support  In chair only                  PT Short Term Goals - 09/17/16 1257      PT SHORT TERM GOAL #1   Title Pt will be independent in initial HEP   Time 3   Period Weeks   Status Achieved     PT SHORT TERM GOAL #2   Title Pt will perform 10 STS without UEs without LOB in between reps to assist with safer transfers at home   Time 3   Period Weeks   Status On-going           PT Long Term Goals - 09/14/16 1159      PT LONG TERM GOAL #1   Title Pt will demo improved Berg to >/= 45/56 for decreased falls risk   Time 6   Period Weeks   Status On-going     PT LONG TERM GOAL #2   Title Pt will report not needing to assist R  LE into car   Time 6   Period Weeks   Status On-going     PT LONG TERM GOAL #3   Title Pt will demo improved bil LE strength >/= 1 MMT grade to assist with more stability with gait   Time 6   Period Weeks   Status On-going     PT LONG TERM GOAL #4   Title Pt will demo improved stability with ambulation with RW or least restrictive assistive device level/unlevel surfaces to assist with out of home outings   Time 6   Period Weeks   Status On-going               Plan - 09/19/16 1231    Clinical Impression Statement Pt entered into session today very stiff. This seemed to improve some thoughout the session with exercises, but even at the end he walked out still rigid.  Still needs verbal cuing throughout the session for his posture.  Fatigue did not seem as bad  at the end of the session today. Pt was able to complete all his sit to stand exercises without any assitance from his UE. He still does not use his cane. When he was asked about this today he replied he doesn't need it.  He was encouraged to use for longer walks in case his legs got tired. He agreed that was a good idea.     Rehab Potential Good   PT Frequency 3x / week   PT Duration 6 weeks   PT Treatment/Interventions ADLs/Self Care Home Management;Cryotherapy;Functional mobility training;Stair training;Gait training;Therapeutic activities;Therapeutic exercise;Balance training;Neuromuscular re-education;Patient/family education;Passive range of motion;Manual techniques;Energy conservation   Consulted and Agree with Plan of Care Patient      Patient will benefit from skilled therapeutic intervention in order to improve the following deficits and impairments:  Abnormal gait, Decreased activity tolerance, Decreased balance, Decreased cognition, Decreased endurance, Decreased mobility, Decreased safety awareness, Decreased strength, Difficulty walking, Impaired sensation  Visit Diagnosis: Weakness generalized  Unsteadiness  on feet  Difficulty in walking, not elsewhere classified  Chronic bilateral low back pain without sciatica     Problem List Patient Active Problem List   Diagnosis Date Noted  . Influenza A (H1N1) 08/07/2014  .  Abnormality of gait 11/05/2013  . OSA (obstructive sleep apnea) 07/23/2013  . Erectile dysfunction 07/23/2013  . NSTEMI (non-ST elevated myocardial infarction) ([place]) 04/16/2013  . Anginal chest pain at rest [facility]) 04/15/2013  . CAD (coronary artery disease), Hx of LAD DES in 2005 04/15/2013  . Peripheral neuropathy 04/15/2013  . Hyperlipidemia with target LDL less than 70 04/15/2013    Sullivan Jacuinde,PTA 09/19/2016, 1:09 PM  [facility] [address], [place], [place], [address] Phone: [phone]   Fax:  [phone]  Name: Corey Larson. MRN: [id number] Date of Birth: July 02, 1927

## 2016-09-21 ENCOUNTER — Encounter: Payer: Self-pay | Admitting: Physical Therapy

## 2016-09-21 ENCOUNTER — Ambulatory Visit: Payer: Medicare Other | Admitting: Physical Therapy

## 2016-09-21 DIAGNOSIS — R531 Weakness: Secondary | ICD-10-CM

## 2016-09-21 DIAGNOSIS — G8929 Other chronic pain: Secondary | ICD-10-CM | POA: Diagnosis not present

## 2016-09-21 DIAGNOSIS — R262 Difficulty in walking, not elsewhere classified: Secondary | ICD-10-CM

## 2016-09-21 DIAGNOSIS — M545 Low back pain: Secondary | ICD-10-CM | POA: Diagnosis not present

## 2016-09-21 DIAGNOSIS — R2681 Unsteadiness on feet: Secondary | ICD-10-CM

## 2016-09-21 NOTE — Therapy (Signed)
[facility] [address] [place], [place], [address] Phone: [phone]   Fax:  [phone]  Physical Therapy Treatment  Patient Details  Name: Corey Larson. MRN: [id number] Date of Birth: 11-13-1927 Referring Provider: Ashby Dawes  Encounter Date: 09/21/2016      PT End of Session - 09/21/16 0933    Visit Number 7   Number of Visits 10   Date for PT Re-Evaluation 10/19/16   PT Start Time 0933   PT Stop Time 1011   PT Time Calculation (min) 38 min   Activity Tolerance Patient tolerated treatment well   Behavior During Therapy [facility] for tasks assessed/performed      Past Medical History:  Diagnosis Date  . Abnormality of gait 11/05/2013  . CAD (coronary artery disease) 2005   stent to LAD by Dr. Glade Lloyd. mild irregulatities of RCA.    [name] CRF (chronic renal failure)   . ED (erectile dysfunction)   . H/O exercise stress test 05/2012   no statistically significant ischemia.   [name] History of TIA (transient ischemic attack)    transient confusion  . HOH (hard of hearing)    hearing aids  . Hyperlipidemia LDL goal < 70   . Hypertension   . Insomnia   . Macular degeneration    bilateral  . Macular degeneration    Right>left  . OSA (obstructive sleep apnea)    mild, never on cpap  . Peripheral neuropathy   . Vitamin D deficiency     Past Surgical History:  Procedure Laterality Date  . ANKLE SURGERY     right ankle 2002 fracture  . BACK SURGERY  1976   laminectomy  . CARDIAC CATHETERIZATION     2005, stent placed DES LAD  . CATARACT EXTRACTION     bilateral  . CORONARY ANGIOPLASTY    . HERNIA REPAIR  1981   "double hernia"  . KNEE SURGERY     right knee 2011   . LEFT HEART CATHETERIZATION WITH CORONARY ANGIOGRAM N/A 04/16/2013   Procedure: LEFT HEART CATHETERIZATION WITH CORONARY ANGIOGRAM;  Surgeon: Leonie Man, MD;  Location: [facility] CATH LAB;  Service: Cardiovascular;  Laterality: N/A;  . NM  MYOCAR MULTIPLE W/SPECT  05/13/2012   MILD UPPER SEPTAL THINNING AND DIAPHRAGMATIC ATTENUATION W/O SIGNIFICANT ISCHEMIA. EF 66%.  . TRANSTHORACIC ECHOCARDIOGRAM  05/12/2010   EF =>55%. MILD CONCENTRIC LVH. TRACE MITRAL REGURG.    There were no vitals filed for this visit.      Subjective Assessment - 09/21/16 0934    Subjective I feel stiff with low energy today.    Currently in Pain? No/denies   Multiple Pain Sites No                         [facility] Adult PT Treatment/Exercise - 09/21/16 0001      Knee/Hip Exercises: Aerobic   Nustep Level 2 x 10 minutes   slow pace     Knee/Hip Exercises: Standing   Heel Raises Both;2 sets;10 reps   Rebounder 3 way weight shift 1 min each     Knee/Hip Exercises: Seated   Long Arc Quad Strengthening;Right;Left;2 sets;10 reps   Long Arc Quad Weight 3 lbs.   Ball Squeeze 25x   Clamshell with TheraBand --  25x red   Marching Strengthening;Both;1 set;20 reps;Weights   Marching Weights 3 lbs.   Sit to Sand 2 sets;5 reps;without UE support  In chair only  PT Short Term Goals - 09/17/16 1257      PT SHORT TERM GOAL #1   Title Pt will be independent in initial HEP   Time 3   Period Weeks   Status Achieved     PT SHORT TERM GOAL #2   Title Pt will perform 10 STS without UEs without LOB in between reps to assist with safer transfers at home   Time 3   Period Weeks   Status On-going           PT Long Term Goals - 09/14/16 1159      PT LONG TERM GOAL #1   Title Pt will demo improved Berg to >/= 45/56 for decreased falls risk   Time 6   Period Weeks   Status On-going     PT LONG TERM GOAL #2   Title Pt will report not needing to assist R LE into car   Time 6   Period Weeks   Status On-going     PT LONG TERM GOAL #3   Title Pt will demo improved bil LE strength >/= 1 MMT grade to assist with more stability with gait   Time 6   Period Weeks   Status On-going     PT LONG TERM GOAL  #4   Title Pt will demo improved stability with ambulation with RW or least restrictive assistive device level/unlevel surfaces to assist with out of home outings   Time 6   Period Weeks   Status On-going               Plan - 09/21/16 0933    Clinical Impression Statement When asked how he feels after his workouts, pt reports he feels good; not too tired or sore, but that he received a good moderate workout. He continues to not use his cane and ambulates into the clinic pretty wobbly in his LE.  Increased the volume of work today and not the loads. The current loads are challenging the pt approprriately.    PT Frequency 3x / week   PT Duration 6 weeks   PT Treatment/Interventions ADLs/Self Care Home Management;Cryotherapy;Functional mobility training;Stair training;Gait training;Therapeutic activities;Therapeutic exercise;Balance training;Neuromuscular re-education;Patient/family education;Passive range of motion;Manual techniques;Energy conservation   Consulted and Agree with Plan of Care Patient      Patient will benefit from skilled therapeutic intervention in order to improve the following deficits and impairments:  Abnormal gait, Decreased activity tolerance, Decreased balance, Decreased cognition, Decreased endurance, Decreased mobility, Decreased safety awareness, Decreased strength, Difficulty walking  Visit Diagnosis: Weakness generalized  Unsteadiness on feet  Difficulty in walking, not elsewhere classified  Chronic bilateral low back pain without sciatica     Problem List Patient Active Problem List   Diagnosis Date Noted  . Influenza A (H1N1) 08/07/2014  . Abnormality of gait 11/05/2013  . OSA (obstructive sleep apnea) 07/23/2013  . Erectile dysfunction 07/23/2013  . NSTEMI (non-ST elevated myocardial infarction) ([place]) 04/16/2013  . Anginal chest pain at rest [facility]) 04/15/2013  . CAD (coronary artery disease), Hx of LAD DES in 2005 04/15/2013  . Peripheral  neuropathy 04/15/2013  . Hyperlipidemia with target LDL less than 70 04/15/2013    Abriana Saltos, PTA 09/21/2016, 10:10 AM  [facility] [address] [place], [place], [address] Phone: [phone]   Fax:  [phone]  Name: Maciej Schweitzer. MRN: [id number] Date of Birth: 09/27/1927

## 2016-10-01 ENCOUNTER — Encounter: Payer: Self-pay | Admitting: Physical Therapy

## 2016-10-01 ENCOUNTER — Ambulatory Visit: Payer: Medicare Other | Admitting: Physical Therapy

## 2016-10-01 DIAGNOSIS — R2681 Unsteadiness on feet: Secondary | ICD-10-CM | POA: Diagnosis not present

## 2016-10-01 DIAGNOSIS — R531 Weakness: Secondary | ICD-10-CM

## 2016-10-01 DIAGNOSIS — M545 Low back pain: Secondary | ICD-10-CM

## 2016-10-01 DIAGNOSIS — R262 Difficulty in walking, not elsewhere classified: Secondary | ICD-10-CM

## 2016-10-01 DIAGNOSIS — G8929 Other chronic pain: Secondary | ICD-10-CM

## 2016-10-01 NOTE — Therapy (Signed)
[facility] [address], [place], [place], [address] Phone: [phone]   Fax:  [phone]  Physical Therapy Treatment  Patient Details  Name: Corey Larson. MRN: [id number] Date of Birth: 1928-03-03 Referring Provider: Ashby Dawes  Encounter Date: 10/01/2016      PT End of Session - 10/01/16 1234    Visit Number 8   Number of Visits 10   Date for PT Re-Evaluation 10/19/16   PT Start Time 1226   PT Stop Time 1307   PT Time Calculation (min) 41 min   Activity Tolerance Patient tolerated treatment well   Behavior During Therapy [facility] for tasks assessed/performed      Past Medical History:  Diagnosis Date  . Abnormality of gait 11/05/2013  . CAD (coronary artery disease) 2005   stent to LAD by Dr. Glade Lloyd. mild irregulatities of RCA.    [name] CRF (chronic renal failure)   . ED (erectile dysfunction)   . H/O exercise stress test 05/2012   no statistically significant ischemia.   [name] History of TIA (transient ischemic attack)    transient confusion  . HOH (hard of hearing)    hearing aids  . Hyperlipidemia LDL goal < 70   . Hypertension   . Insomnia   . Macular degeneration    bilateral  . Macular degeneration    Right>left  . OSA (obstructive sleep apnea)    mild, never on cpap  . Peripheral neuropathy   . Vitamin D deficiency     Past Surgical History:  Procedure Laterality Date  . ANKLE SURGERY     right ankle 2002 fracture  . BACK SURGERY  1976   laminectomy  . CARDIAC CATHETERIZATION     2005, stent placed DES LAD  . CATARACT EXTRACTION     bilateral  . CORONARY ANGIOPLASTY    . HERNIA REPAIR  1981   "double hernia"  . KNEE SURGERY     right knee 2011   . LEFT HEART CATHETERIZATION WITH CORONARY ANGIOGRAM N/A 04/16/2013   Procedure: LEFT HEART CATHETERIZATION WITH CORONARY ANGIOGRAM;  Surgeon: Leonie Man, MD;  Location: [facility] CATH LAB;  Service: Cardiovascular;  Laterality: N/A;  . NM  MYOCAR MULTIPLE W/SPECT  05/13/2012   MILD UPPER SEPTAL THINNING AND DIAPHRAGMATIC ATTENUATION W/O SIGNIFICANT ISCHEMIA. EF 66%.  . TRANSTHORACIC ECHOCARDIOGRAM  05/12/2010   EF =>55%. MILD CONCENTRIC LVH. TRACE MITRAL REGURG.    There were no vitals filed for this visit.      Subjective Assessment - 10/01/16 1235    Subjective Just came back from [place] trip. I did a little walking and tried to get into the swimming pool. The swimming pool made my arms and elbows too sore.    Currently in Pain? No/denies   Multiple Pain Sites No                         [facility] Adult PT Treatment/Exercise - 10/01/16 0001      Knee/Hip Exercises: Aerobic   Nustep Level 2 x 10 minutes   slow pace     Knee/Hip Exercises: Standing   Forward Step Up Both;1 set;10 reps   SLS with contralateral toe taps 10x bil   VC for posture   Rebounder 3 way weight shift 1 min each   Other Standing Knee Exercises Golf putting 10x     Knee/Hip Exercises: Seated   Long Arc Quad Strengthening;Right;Left;2 sets;10 reps  Long Arc Quad Weight 3 lbs.   Ball Squeeze 25x   Clamshell with TheraBand --  25x green   Marching Strengthening;Both;1 set;20 reps;Weights   Marching Weights 3 lbs.   Sit to Sand 2 sets;5 reps;without UE support  In chair only                  PT Short Term Goals - 10/01/16 1304      PT SHORT TERM GOAL #2   Title Pt will perform 10 STS without UEs without LOB in between reps to assist with safer transfers at home   Time 3   Period Weeks   Status On-going  Pt can do 2x5 but the rest is mainly for his knees not his balance.            PT Long Term Goals - 09/14/16 1159      PT LONG TERM GOAL #1   Title Pt will demo improved Berg to >/= 45/56 for decreased falls risk   Time 6   Period Weeks   Status On-going     PT LONG TERM GOAL #2   Title Pt will report not needing to assist R LE into car   Time 6   Period Weeks   Status On-going     PT LONG TERM  GOAL #3   Title Pt will demo improved bil LE strength >/= 1 MMT grade to assist with more stability with gait   Time 6   Period Weeks   Status On-going     PT LONG TERM GOAL #4   Title Pt will demo improved stability with ambulation with RW or least restrictive assistive device level/unlevel surfaces to assist with out of home outings   Time 6   Period Weeks   Status On-going               Plan - 10/01/16 1234    Clinical Impression Statement Pt rpeorts upper body ( arms & elbows mainly) sore from trying to swim in the pool during his trip las week. He reports walking on the beach without any falls .  He does not feel confident perfroming stanidng balance exercises without holding onto to something.  His back was sore after puting exercise but no lose of balance or unsteadiness demonstrated.    Rehab Potential Good   PT Frequency 3x / week   PT Duration 6 weeks   PT Treatment/Interventions ADLs/Self Care Home Management;Cryotherapy;Functional mobility training;Stair training;Gait training;Therapeutic activities;Therapeutic exercise;Balance training;Neuromuscular re-education;Patient/family education;Passive range of motion;Manual techniques;Energy conservation   PT Next Visit Plan Balance, strength, gait, endurance, progress as pt tolerates. Maybe BERG for golas?   Consulted and Agree with Plan of Care --      Patient will benefit from skilled therapeutic intervention in order to improve the following deficits and impairments:  Abnormal gait, Decreased activity tolerance, Decreased balance, Decreased cognition, Decreased endurance, Decreased mobility, Decreased safety awareness, Decreased strength, Difficulty walking  Visit Diagnosis: Weakness generalized  Unsteadiness on feet  Difficulty in walking, not elsewhere classified  Chronic bilateral low back pain without sciatica     Problem List Patient Active Problem List   Diagnosis Date Noted  . Influenza A (H1N1)  08/07/2014  . Abnormality of gait 11/05/2013  . OSA (obstructive sleep apnea) 07/23/2013  . Erectile dysfunction 07/23/2013  . NSTEMI (non-ST elevated myocardial infarction) ([place]) 04/16/2013  . Anginal chest pain at rest [facility]) 04/15/2013  . CAD (coronary artery disease), Hx of LAD DES  in 2005 04/15/2013  . Peripheral neuropathy 04/15/2013  . Hyperlipidemia with target LDL less than 70 04/15/2013    Corey Larson, PTA 10/01/2016, 1:06 PM  [facility] [address] [place], [place], [address] Phone: [phone]   Fax:  [phone]  Name: Corey Larson. MRN: [id number] Date of Birth: 1927/12/14

## 2016-10-03 ENCOUNTER — Ambulatory Visit: Payer: Medicare Other | Attending: Internal Medicine

## 2016-10-03 DIAGNOSIS — R262 Difficulty in walking, not elsewhere classified: Secondary | ICD-10-CM | POA: Diagnosis not present

## 2016-10-03 DIAGNOSIS — M545 Low back pain, unspecified: Secondary | ICD-10-CM

## 2016-10-03 DIAGNOSIS — R531 Weakness: Secondary | ICD-10-CM

## 2016-10-03 DIAGNOSIS — R2681 Unsteadiness on feet: Secondary | ICD-10-CM | POA: Diagnosis not present

## 2016-10-03 DIAGNOSIS — G8929 Other chronic pain: Secondary | ICD-10-CM

## 2016-10-03 NOTE — Therapy (Signed)
[facility] [address] [place], [place], [address] Phone: [phone]   Fax:  [phone]  Physical Therapy Treatment  Patient Details  Name: Marquest Gunkel. MRN: [id number] Date of Birth: 1928/01/25 Referring Provider: Ashby Dawes  Encounter Date: 10/03/2016      PT End of Session - 10/03/16 1316    Visit Number 9   Number of Visits 19   Date for PT Re-Evaluation 10/19/16   PT Start Time 1230   PT Stop Time 1309   PT Time Calculation (min) 39 min   Activity Tolerance Patient tolerated treatment well;Patient limited by pain  reduced standing exercises   Behavior During Therapy [facility] for tasks assessed/performed      Past Medical History:  Diagnosis Date  . Abnormality of gait 11/05/2013  . CAD (coronary artery disease) 2005   stent to LAD by Dr. Glade Lloyd. mild irregulatities of RCA.    [name] CRF (chronic renal failure)   . ED (erectile dysfunction)   . H/O exercise stress test 05/2012   no statistically significant ischemia.   [name] History of TIA (transient ischemic attack)    transient confusion  . HOH (hard of hearing)    hearing aids  . Hyperlipidemia LDL goal < 70   . Hypertension   . Insomnia   . Macular degeneration    bilateral  . Macular degeneration    Right>left  . OSA (obstructive sleep apnea)    mild, never on cpap  . Peripheral neuropathy   . Vitamin D deficiency     Past Surgical History:  Procedure Laterality Date  . ANKLE SURGERY     right ankle 2002 fracture  . BACK SURGERY  1976   laminectomy  . CARDIAC CATHETERIZATION     2005, stent placed DES LAD  . CATARACT EXTRACTION     bilateral  . CORONARY ANGIOPLASTY    . HERNIA REPAIR  1981   "double hernia"  . KNEE SURGERY     right knee 2011   . LEFT HEART CATHETERIZATION WITH CORONARY ANGIOGRAM N/A 04/16/2013   Procedure: LEFT HEART CATHETERIZATION WITH CORONARY ANGIOGRAM;  Surgeon: Leonie Man, MD;  Location: [facility] CATH  LAB;  Service: Cardiovascular;  Laterality: N/A;  . NM MYOCAR MULTIPLE W/SPECT  05/13/2012   MILD UPPER SEPTAL THINNING AND DIAPHRAGMATIC ATTENUATION W/O SIGNIFICANT ISCHEMIA. EF 66%.  . TRANSTHORACIC ECHOCARDIOGRAM  05/12/2010   EF =>55%. MILD CONCENTRIC LVH. TRACE MITRAL REGURG.    There were no vitals filed for this visit.      Subjective Assessment - 10/03/16 1234    Subjective Pt reports that his feet are hurting a lot today.     Pertinent History Pt/friend report pt has had 2 falls in 2018 and balance is becoming more of an issue. Pt reluctant to use a RW. He has fallen at church due to decreased foot clearance and got up and fell in the middle of the night. Both parties report pt had a sudden decline. ? TIA. They are to consult with MD regarding due to suddenness and of physical and cognitive decline.   Currently in Pain? Yes   Pain Score 8    Pain Location Foot   Pain Orientation Right;Left   Pain Descriptors / Indicators Discomfort   Pain Type Chronic pain   Pain Onset More than a month ago   Pain Frequency Intermittent   Aggravating Factors  standing, walking, activity   Pain Relieving Factors sitting  [facility] PT Assessment - 10/03/16 0001      Transfers   Transfers Sit to Stand;Stand to Sit   Sit to Stand With upper extremity assist   Five time sit to stand comments  31   Stand to Sit With upper extremity assist   Comments pt with loss of balance after 6th rep of sit to stand.     Ambulation/Gait   Gait Comments pt amb with antaglic gait with bil knee flexion R > L with bobbing of R knee flexion indicative of quad weakness                     [facility] Adult PT Treatment/Exercise - 10/03/16 0001      Knee/Hip Exercises: Aerobic   Nustep Level 2 x 10 minutes   slow pace. PT present to discuss progress with patient.     Knee/Hip Exercises: Standing   Rebounder 3 way weight shift 1 min each     Knee/Hip Exercises: Seated   Long Arc Quad  Strengthening;Right;Left;2 sets;10 reps   Long Arc Quad Weight 3 lbs.   Ball Squeeze 25x   Clamshell with TheraBand --  25x green   Marching Strengthening;Both;1 set;20 reps;Weights   Marching Weights 3 lbs.   Sit to Sand 2 sets;5 reps;without UE support  In chair only                  PT Short Term Goals - 10/03/16 1238      PT SHORT TERM GOAL #1   Title Pt will be independent in initial HEP   Status Achieved     PT SHORT TERM GOAL #2   Title Pt will perform 10 STS without UEs without LOB in between reps to assist with safer transfers at home   Baseline 5 reps   Time 3   Period Weeks   Status On-going           PT Long Term Goals - 10/03/16 1239      PT LONG TERM GOAL #2   Title Pt will report not needing to assist R LE into car   Baseline unable   Time 6   Period Weeks   Status On-going     PT LONG TERM GOAL #4   Title Pt will demo improved stability with ambulation with RW or least restrictive assistive device level/unlevel surfaces to assist with out of home outings   Baseline pt has 2 walkers and is not using the walkers   Time 6   Period Weeks   Status On-going               Plan - 10/03/16 1251    Clinical Impression Statement Pt reports increased foot pain today.  Pt performed 5x sit to stand in 31 seconds today. Pt with loss of balance after 6th repetition of sit to stand.  Pt reports no change in use of Rt UE with car transfers.  Pt does not use his walker.  Pt demonstrates flexed trunk posture with gait and standing.  Pt was confused duing conversation during treatment today. Pt will continue to benefit from skilled PT for balance, LE strength and endurance to improve safety.   Rehab Potential Good   PT Frequency 3x / week   PT Duration 6 weeks   PT Treatment/Interventions ADLs/Self Care Home Management;Cryotherapy;Functional mobility training;Stair training;Gait training;Therapeutic activities;Therapeutic exercise;Balance  training;Neuromuscular re-education;Patient/family education;Passive range of motion;Manual techniques;Energy conservation   PT Next Visit Plan Balance, strength,  gait, endurance, progress as pt tolerates. Try BERG if foot pain is resolved.   Consulted and Agree with Plan of Care Patient      Patient will benefit from skilled therapeutic intervention in order to improve the following deficits and impairments:  Abnormal gait, Decreased activity tolerance, Decreased balance, Decreased cognition, Decreased endurance, Decreased mobility, Decreased safety awareness, Decreased strength, Difficulty walking  Visit Diagnosis: Weakness generalized  Unsteadiness on feet  Difficulty in walking, not elsewhere classified  Chronic bilateral low back pain without sciatica       G-Codes - 10/07/2016 1247    Functional Assessment Tool Used (Outpatient Only) 5x sit to stand   Functional Limitation Mobility: Walking and moving around   Mobility: Walking and Moving Around Current Status [phone]) At least 60 percent but less than 80 percent impaired, limited or restricted   Mobility: Walking and Moving Around Goal Status [id number]) At least 40 percent but less than 60 percent impaired, limited or restricted      Problem List Patient Active Problem List   Diagnosis Date Noted  . Influenza A (H1N1) 08/07/2014  . Abnormality of gait 11/05/2013  . OSA (obstructive sleep apnea) 07/23/2013  . Erectile dysfunction 07/23/2013  . NSTEMI (non-ST elevated myocardial infarction) ([place]) 04/16/2013  . Anginal chest pain at rest [facility]) 04/15/2013  . CAD (coronary artery disease), Hx of LAD DES in 2005 04/15/2013  . Peripheral neuropathy 04/15/2013  . Hyperlipidemia with target LDL less than 70 04/15/2013     Sigurd Sos, PT Oct 07, 2016 1:18 PM  [facility] [address] [place], [place], [address] Phone: [phone]   Fax:  [phone]  Name: Huxley Shurley. MRN: [id number] Date of Birth: Jan 30, 1928

## 2016-10-05 ENCOUNTER — Ambulatory Visit: Payer: Medicare Other | Admitting: Physical Therapy

## 2016-10-05 ENCOUNTER — Encounter: Payer: Self-pay | Admitting: Physical Therapy

## 2016-10-05 DIAGNOSIS — G8929 Other chronic pain: Secondary | ICD-10-CM

## 2016-10-05 DIAGNOSIS — R531 Weakness: Secondary | ICD-10-CM | POA: Diagnosis not present

## 2016-10-05 DIAGNOSIS — R262 Difficulty in walking, not elsewhere classified: Secondary | ICD-10-CM

## 2016-10-05 DIAGNOSIS — M545 Low back pain: Secondary | ICD-10-CM | POA: Diagnosis not present

## 2016-10-05 DIAGNOSIS — R2681 Unsteadiness on feet: Secondary | ICD-10-CM | POA: Diagnosis not present

## 2016-10-05 NOTE — Therapy (Signed)
[facility] [address] [place], [place], [address] Phone: [phone]   Fax:  [phone]  Physical Therapy Treatment  Patient Details  Name: Corey Larson. MRN: [id number] Date of Birth: [birth year] Referring Provider: Ashby Dawes  Encounter Date: 10/05/2016      PT End of Session - 10/05/16 1057    Visit Number 10   Number of Visits 19   Date for PT Re-Evaluation 10/19/16   PT Start Time 1056   PT Stop Time 1135   PT Time Calculation (min) 39 min   Activity Tolerance Patient tolerated treatment well   Behavior During Therapy --      Past Medical History:  Diagnosis Date  . Abnormality of gait 11/05/2013  . CAD (coronary artery disease) 2005   stent to LAD by Dr. Glade Lloyd. mild irregulatities of RCA.    [name] CRF (chronic renal failure)   . ED (erectile dysfunction)   . H/O exercise stress test 05/2012   no statistically significant ischemia.   [name] History of TIA (transient ischemic attack)    transient confusion  . HOH (hard of hearing)    hearing aids  . Hyperlipidemia LDL goal < 70   . Hypertension   . Insomnia   . Macular degeneration    bilateral  . Macular degeneration    Right>left  . OSA (obstructive sleep apnea)    mild, never on cpap  . Peripheral neuropathy   . Vitamin D deficiency     Past Surgical History:  Procedure Laterality Date  . ANKLE SURGERY     right ankle 2002 fracture  . BACK SURGERY  1976   laminectomy  . CARDIAC CATHETERIZATION     2005, stent placed DES LAD  . CATARACT EXTRACTION     bilateral  . CORONARY ANGIOPLASTY    . HERNIA REPAIR  1981   "double hernia"  . KNEE SURGERY     right knee 2011   . LEFT HEART CATHETERIZATION WITH CORONARY ANGIOGRAM N/A 04/16/2013   Procedure: LEFT HEART CATHETERIZATION WITH CORONARY ANGIOGRAM;  Surgeon: Leonie Man, MD;  Location: [facility] CATH LAB;  Service: Cardiovascular;  Laterality: N/A;  . NM MYOCAR MULTIPLE W/SPECT   05/13/2012   MILD UPPER SEPTAL THINNING AND DIAPHRAGMATIC ATTENUATION W/O SIGNIFICANT ISCHEMIA. EF 66%.  . TRANSTHORACIC ECHOCARDIOGRAM  05/12/2010   EF =>55%. MILD CONCENTRIC LVH. TRACE MITRAL REGURG.    There were no vitals filed for this visit.      Subjective Assessment - 10/05/16 1059    Subjective When asked about prior foot pain complaint, pt reports this was his usual neuropathy and nothing new.    Limitations Standing;Walking   How long can you sit comfortably? > 1 hour   How long can you stand comfortably? < 5 minutes   How long can you walk comfortably? < 5 minutes   Diagnostic tests none   Patient Stated Goals "to not fall anymore."   Currently in Pain? No/denies   Multiple Pain Sites No                         [facility] Adult PT Treatment/Exercise - 10/05/16 0001      High Level Balance   High Level Balance Activities --  Ball toss 20x with narrow BOS and small step forward      Knee/Hip Exercises: Aerobic   Nustep Level 2 x 10 minutes    PTA  present to  discuss progress with patient.     Knee/Hip Exercises: Standing   SLS with contralateral toe taps 10x bil   VC for posture   Rebounder 3 way weight shift 1 min each   Other Standing Knee Exercises Golf putting 10x     Knee/Hip Exercises: Seated   Long Arc Quad Strengthening;Right;Left;2 sets;10 reps   Long Arc Quad Weight 3 lbs.   Ball Squeeze 30x   Marching Strengthening;Both;1 set;20 reps;Weights   Marching Weights 3 lbs.   Sit to Sand 2 sets;5 reps;without UE support  In chair only                  PT Short Term Goals - 10/03/16 1238      PT SHORT TERM GOAL #1   Title Pt will be independent in initial HEP   Status Achieved     PT SHORT TERM GOAL #2   Title Pt will perform 10 STS without UEs without LOB in between reps to assist with safer transfers at home   Baseline 5 reps   Time 3   Period Weeks   Status On-going           PT Long Term Goals - 10/03/16 1239       PT LONG TERM GOAL #2   Title Pt will report not needing to assist R LE into car   Baseline unable   Time 6   Period Weeks   Status On-going     PT LONG TERM GOAL #4   Title Pt will demo improved stability with ambulation with RW or least restrictive assistive device level/unlevel surfaces to assist with out of home outings   Baseline pt has 2 walkers and is not using the walkers   Time 6   Period Weeks   Status On-going               Plan - 10/05/16 1057    Clinical Impression Statement Today was a good session for pt. He tolerated 39 min of continuous exercise with mild fatigue, fair balance with challenges that were presented, and no complaints of pain. Still chooses to not use an  assitivee device.    Rehab Potential Good   PT Frequency 3x / week   PT Duration 6 weeks   PT Treatment/Interventions ADLs/Self Care Home Management;Cryotherapy;Functional mobility training;Stair training;Gait training;Therapeutic activities;Therapeutic exercise;Balance training;Neuromuscular re-education;Patient/family education;Passive range of motion;Manual techniques;Energy conservation   PT Next Visit Plan Balance, strength, gait, endurance, progress as pt tolerates. Try BERG next week.    Consulted and Agree with Plan of Care Patient      Patient will benefit from skilled therapeutic intervention in order to improve the following deficits and impairments:  Abnormal gait, Decreased activity tolerance, Decreased balance, Decreased cognition, Decreased endurance, Decreased mobility, Decreased safety awareness, Decreased strength, Difficulty walking  Visit Diagnosis: Weakness generalized  Unsteadiness on feet  Difficulty in walking, not elsewhere classified  Chronic bilateral low back pain without sciatica     Problem List Patient Active Problem List   Diagnosis Date Noted  . Influenza A (H1N1) 08/07/2014  . Abnormality of gait 11/05/2013  . OSA (obstructive sleep apnea)  07/23/2013  . Erectile dysfunction 07/23/2013  . NSTEMI (non-ST elevated myocardial infarction) ([place]) 04/16/2013  . Anginal chest pain at rest [facility]) 04/15/2013  . CAD (coronary artery disease), Hx of LAD DES in 2005 04/15/2013  . Peripheral neuropathy 04/15/2013  . Hyperlipidemia with target LDL less than 70 04/15/2013    Corey Larson,  PTA 10/05/2016, 11:32 AM  [facility] [address], [place], [place], [address] Phone: [phone]   Fax:  [phone]  Name: Corey Larson. MRN: [id number] Date of Birth: 11-02-1927

## 2016-10-08 ENCOUNTER — Encounter: Payer: Medicare Other | Admitting: Physical Therapy

## 2016-10-09 ENCOUNTER — Ambulatory Visit: Payer: Medicare Other | Admitting: Physical Therapy

## 2016-10-09 ENCOUNTER — Encounter: Payer: Self-pay | Admitting: Physical Therapy

## 2016-10-09 DIAGNOSIS — M545 Low back pain, unspecified: Secondary | ICD-10-CM

## 2016-10-09 DIAGNOSIS — R262 Difficulty in walking, not elsewhere classified: Secondary | ICD-10-CM | POA: Diagnosis not present

## 2016-10-09 DIAGNOSIS — R531 Weakness: Secondary | ICD-10-CM

## 2016-10-09 DIAGNOSIS — G8929 Other chronic pain: Secondary | ICD-10-CM

## 2016-10-09 DIAGNOSIS — R2681 Unsteadiness on feet: Secondary | ICD-10-CM | POA: Diagnosis not present

## 2016-10-09 NOTE — Therapy (Signed)
[facility] [address] [place], [place], [address] Phone: [phone]   Fax:  [phone]  Physical Therapy Treatment  Patient Details  Name: Corey Larson. MRN: [id number] Date of Birth: [birth year] Referring Provider: Ashby Dawes  Encounter Date: 10/09/2016      PT End of Session - 10/09/16 1236    Visit Number 11   Number of Visits 19   Date for PT Re-Evaluation 10/19/16   PT Start Time 1234   PT Stop Time 1314   PT Time Calculation (min) 40 min   Activity Tolerance Patient tolerated treatment well   Behavior During Therapy [facility] for tasks assessed/performed      Past Medical History:  Diagnosis Date  . Abnormality of gait 11/05/2013  . CAD (coronary artery disease) 2005   stent to LAD by Dr. Glade Lloyd. mild irregulatities of RCA.    [name] CRF (chronic renal failure)   . ED (erectile dysfunction)   . H/O exercise stress test 05/2012   no statistically significant ischemia.   [name] History of TIA (transient ischemic attack)    transient confusion  . HOH (hard of hearing)    hearing aids  . Hyperlipidemia LDL goal < 70   . Hypertension   . Insomnia   . Macular degeneration    bilateral  . Macular degeneration    Right>left  . OSA (obstructive sleep apnea)    mild, never on cpap  . Peripheral neuropathy   . Vitamin D deficiency     Past Surgical History:  Procedure Laterality Date  . ANKLE SURGERY     right ankle 2002 fracture  . BACK SURGERY  1976   laminectomy  . CARDIAC CATHETERIZATION     2005, stent placed DES LAD  . CATARACT EXTRACTION     bilateral  . CORONARY ANGIOPLASTY    . HERNIA REPAIR  1981   "double hernia"  . KNEE SURGERY     right knee 2011   . LEFT HEART CATHETERIZATION WITH CORONARY ANGIOGRAM N/A 04/16/2013   Procedure: LEFT HEART CATHETERIZATION WITH CORONARY ANGIOGRAM;  Surgeon: Leonie Man, MD;  Location: [facility] CATH LAB;  Service: Cardiovascular;  Laterality: N/A;  . NM  MYOCAR MULTIPLE W/SPECT  05/13/2012   MILD UPPER SEPTAL THINNING AND DIAPHRAGMATIC ATTENUATION W/O SIGNIFICANT ISCHEMIA. EF 66%.  . TRANSTHORACIC ECHOCARDIOGRAM  05/12/2010   EF =>55%. MILD CONCENTRIC LVH. TRACE MITRAL REGURG.    There were no vitals filed for this visit.      Subjective Assessment - 10/09/16 1245    Subjective I had a rough day yesterday, I think I did something to my shoulder that's not so good.    Patient is accompained by: Family member   Pertinent History Pt/friend report pt has had 2 falls in 2018 and balance is becoming more of an issue. Pt reluctant to use a RW. He has fallen at church due to decreased foot clearance and got up and fell in the middle of the night. Both parties report pt had a sudden decline. ? TIA. They are to consult with MD regarding due to suddenness and of physical and cognitive decline.   Limitations Standing;Walking   How long can you sit comfortably? > 1 hour   How long can you stand comfortably? < 5 minutes   How long can you walk comfortably? < 5 minutes   Diagnostic tests none   Patient Stated Goals "to not fall anymore."  [facility] Adult PT Treatment/Exercise - 10/09/16 0001      High Level Balance   High Level Balance Activities --  Ball toss 20x with narrow BOS and small step forward      Knee/Hip Exercises: Aerobic   Nustep Level 2 x 10 minutes    PTA  present to discuss progress with patient.     Knee/Hip Exercises: Standing   SLS with contralateral toe taps 10x bil   VC for posture   Rebounder 3 way weight shift 1 min each   Other Standing Knee Exercises Golf putting 10x     Knee/Hip Exercises: Seated   Long Arc Quad Strengthening;Right;Left;2 sets;10 reps   Long Arc Quad Weight 4 lbs.   Ball Squeeze 30x   Clamshell with TheraBand --  25x green   Marching Strengthening;Both;1 set;20 reps;Weights   Marching Weights 4 lbs.   Sit to Sand 2 sets;5 reps;without UE support  In chair  only                  PT Short Term Goals - 10/09/16 1237      PT SHORT TERM GOAL #2   Title Pt will perform 10 STS without UEs without LOB in between reps to assist with safer transfers at home   Baseline 5 reps   Time 3   Period Weeks   Status On-going     PT SHORT TERM GOAL #3   Title Pt will demo improved Berg to 30/56 for decreased falls risk   Baseline 26/56   Time 3   Period Weeks   Status On-going           PT Long Term Goals - 10/09/16 1237      PT LONG TERM GOAL #1   Title Pt will demo improved Berg to >/= 45/56 for decreased falls risk   Time 6   Period Weeks   Status On-going     PT LONG TERM GOAL #2   Title Pt will report not needing to assist R LE into car   Time 6   Period Weeks   Status On-going     PT LONG TERM GOAL #3   Title Pt will demo improved bil LE strength >/= 1 MMT grade to assist with more stability with gait   Baseline R hip flex 3, knee ext 4-, knee flex 3-, DF 3-; L hip flex 4- and all others 4/5   Time 6   Period Weeks   Status On-going     PT LONG TERM GOAL #4   Title Pt will demo improved stability with ambulation with RW or least restrictive assistive device level/unlevel surfaces to assist with out of home outings   Baseline pt has 2 walkers and is not using the walkers   Time 6   Period Weeks   Status On-going               Plan - 10/09/16 1314    Clinical Impression Statement Patient able to tolerate all exercises well with some fatigue and seated rest breaks between each exercise. Patient did well with all balance exercises, continues to have difficulty due to weakness in Bil LE. Able to tolerate #4 ankle weights with seated exercises today. Patient will contnue to benefit from skilled thearpy for strengthening and balance.    Rehab Potential Good   PT Frequency 3x / week   PT Duration 6 weeks   PT Treatment/Interventions ADLs/Self Care Home Management;Cryotherapy;Functional mobility training;Stair  training;Gait training;Therapeutic activities;Therapeutic exercise;Balance training;Neuromuscular re-education;Patient/family education;Passive range of motion;Manual techniques;Energy conservation   PT Next Visit Plan Balance, strength, gait, endurance, progress as pt tolerates. Try BERG next week.    Consulted and Agree with Plan of Care Patient      Patient will benefit from skilled therapeutic intervention in order to improve the following deficits and impairments:  Abnormal gait, Decreased activity tolerance, Decreased balance, Decreased cognition, Decreased endurance, Decreased mobility, Decreased safety awareness, Decreased strength, Difficulty walking  Visit Diagnosis: Weakness generalized  Unsteadiness on feet  Difficulty in walking, not elsewhere classified  Chronic bilateral low back pain without sciatica     Problem List Patient Active Problem List   Diagnosis Date Noted  . Influenza A (H1N1) 08/07/2014  . Abnormality of gait 11/05/2013  . OSA (obstructive sleep apnea) 07/23/2013  . Erectile dysfunction 07/23/2013  . NSTEMI (non-ST elevated myocardial infarction) ([place]) 04/16/2013  . Anginal chest pain at rest [facility]) 04/15/2013  . CAD (coronary artery disease), Hx of LAD DES in 2005 04/15/2013  . Peripheral neuropathy 04/15/2013  . Hyperlipidemia with target LDL less than 70 04/15/2013    Mikle Bosworth PTA 10/09/2016, 1:23 PM  [facility] [address] [place], [place], [address] Phone: [phone]   Fax:  [phone]  Name: Corey Larson. MRN: [id number] Date of Birth: July 11, 1927

## 2016-10-10 ENCOUNTER — Ambulatory Visit: Payer: Medicare Other | Admitting: Physical Therapy

## 2016-10-10 ENCOUNTER — Encounter: Payer: Self-pay | Admitting: Physical Therapy

## 2016-10-10 DIAGNOSIS — M7581 Other shoulder lesions, right shoulder: Secondary | ICD-10-CM | POA: Diagnosis not present

## 2016-10-10 DIAGNOSIS — R531 Weakness: Secondary | ICD-10-CM | POA: Diagnosis not present

## 2016-10-10 DIAGNOSIS — R2681 Unsteadiness on feet: Secondary | ICD-10-CM | POA: Diagnosis not present

## 2016-10-10 DIAGNOSIS — R262 Difficulty in walking, not elsewhere classified: Secondary | ICD-10-CM | POA: Diagnosis not present

## 2016-10-10 DIAGNOSIS — M545 Low back pain, unspecified: Secondary | ICD-10-CM

## 2016-10-10 DIAGNOSIS — G8929 Other chronic pain: Secondary | ICD-10-CM | POA: Diagnosis not present

## 2016-10-10 DIAGNOSIS — M1711 Unilateral primary osteoarthritis, right knee: Secondary | ICD-10-CM | POA: Diagnosis not present

## 2016-10-10 DIAGNOSIS — M7541 Impingement syndrome of right shoulder: Secondary | ICD-10-CM | POA: Diagnosis not present

## 2016-10-10 NOTE — Therapy (Signed)
[facility] [address] [place], [place], [address] Phone: [phone]   Fax:  [phone]  Physical Therapy Treatment  Patient Details  Name: Corey Larson. MRN: [id number] Date of Birth: 1927/06/10 Referring Provider: Ashby Dawes  Encounter Date: 10/10/2016      PT End of Session - 10/10/16 1529    Visit Number 12   Number of Visits 19   Date for PT Re-Evaluation 10/19/16   PT Start Time 0347  Pt limited by pain and fatigue today   PT Stop Time 1600   PT Time Calculation (min) 33 min   Activity Tolerance No increased pain;Patient limited by fatigue   Behavior During Therapy [facility] for tasks assessed/performed      Past Medical History:  Diagnosis Date  . Abnormality of gait 11/05/2013  . CAD (coronary artery disease) 2005   stent to LAD by Dr. Glade Lloyd. mild irregulatities of RCA.    [name] CRF (chronic renal failure)   . ED (erectile dysfunction)   . H/O exercise stress test 05/2012   no statistically significant ischemia.   [name] History of TIA (transient ischemic attack)    transient confusion  . HOH (hard of hearing)    hearing aids  . Hyperlipidemia LDL goal < 70   . Hypertension   . Insomnia   . Macular degeneration    bilateral  . Macular degeneration    Right>left  . OSA (obstructive sleep apnea)    mild, never on cpap  . Peripheral neuropathy   . Vitamin D deficiency     Past Surgical History:  Procedure Laterality Date  . ANKLE SURGERY     right ankle 2002 fracture  . BACK SURGERY  1976   laminectomy  . CARDIAC CATHETERIZATION     2005, stent placed DES LAD  . CATARACT EXTRACTION     bilateral  . CORONARY ANGIOPLASTY    . HERNIA REPAIR  1981   "double hernia"  . KNEE SURGERY     right knee 2011   . LEFT HEART CATHETERIZATION WITH CORONARY ANGIOGRAM N/A 04/16/2013   Procedure: LEFT HEART CATHETERIZATION WITH CORONARY ANGIOGRAM;  Surgeon: Leonie Man, MD;  Location: [facility] CATH LAB;   Service: Cardiovascular;  Laterality: N/A;  . NM MYOCAR MULTIPLE W/SPECT  05/13/2012   MILD UPPER SEPTAL THINNING AND DIAPHRAGMATIC ATTENUATION W/O SIGNIFICANT ISCHEMIA. EF 66%.  . TRANSTHORACIC ECHOCARDIOGRAM  05/12/2010   EF =>55%. MILD CONCENTRIC LVH. TRACE MITRAL REGURG.    There were no vitals filed for this visit.      Subjective Assessment - 10/10/16 1530    Subjective Saw MD for shoulder this AM. He received an RX for shoulder impingement and to get PT for it. I do not feel  good this afternoon:  this is a late appt for me and my Rt knee really hurts today.  Pt received a cortisone shot in the Rt shoulder today.    Currently in Pain? Yes  RT shoulder and Rt knee   Pain Score 9    Pain Orientation Right   Pain Descriptors / Indicators Sore;Tightness   Aggravating Factors  Weightbearing   Pain Relieving Factors Sitting   Multiple Pain Sites No                         [facility] Adult PT Treatment/Exercise - 10/10/16 0001      Knee/Hip Exercises: Aerobic   Nustep Level 2 x 10  minutes    PTA  present to discuss progress with patient.     Knee/Hip Exercises: Standing   SLS Held   Rebounder 3 way weight shift 1 min each   Other Standing Knee Exercises held     Knee/Hip Exercises: Seated   Long Arc Quad AROM;Strengthening;Both;1 set;10 reps;Weights   Long Arc Quad Weight 2 lbs.   Ball Squeeze 30x   Clamshell with TheraBand --  25x green   Marching Strengthening;Both;1 set;20 reps;Weights   Marching Weights 2 lbs.   Sit to Sand --  declined due to knee pain today                  PT Short Term Goals - 10/09/16 1237      PT SHORT TERM GOAL #2   Title Pt will perform 10 STS without UEs without LOB in between reps to assist with safer transfers at home   Baseline 5 reps   Time 3   Period Weeks   Status On-going     PT SHORT TERM GOAL #3   Title Pt will demo improved Berg to 30/56 for decreased falls risk   Baseline 26/56   Time 3   Period  Weeks   Status On-going           PT Long Term Goals - 10/09/16 1237      PT LONG TERM GOAL #1   Title Pt will demo improved Berg to >/= 45/56 for decreased falls risk   Time 6   Period Weeks   Status On-going     PT LONG TERM GOAL #2   Title Pt will report not needing to assist R LE into car   Time 6   Period Weeks   Status On-going     PT LONG TERM GOAL #3   Title Pt will demo improved bil LE strength >/= 1 MMT grade to assist with more stability with gait   Baseline R hip flex 3, knee ext 4-, knee flex 3-, DF 3-; L hip flex 4- and all others 4/5   Time 6   Period Weeks   Status On-going     PT LONG TERM GOAL #4   Title Pt will demo improved stability with ambulation with RW or least restrictive assistive device level/unlevel surfaces to assist with out of home outings   Baseline pt has 2 walkers and is not using the walkers   Time 6   Period Weeks   Status On-going               Plan - 10/10/16 1532    Clinical Impression Statement Pt having a lot of pain in his Rt shoulder and Rt knee. He also was fairly fatigued at the beginning of the session due to a later appt than usual. Due to the knee pain we focused on seated exercises as weighbering increased his pain. He also received a cortisone shot in his Rt shoulder this Am so we held on any UE movements. All weights were also reduced. Pt could tolerate the session with these modifications.     Rehab Potential Good   PT Frequency 3x / week   PT Duration 6 weeks   PT Treatment/Interventions ADLs/Self Care Home Management;Cryotherapy;Functional mobility training;Stair training;Gait training;Therapeutic activities;Therapeutic exercise;Balance training;Neuromuscular re-education;Patient/family education;Passive range of motion;Manual techniques;Energy conservation   PT Next Visit Plan B BERG in next 1-2 visits. Pt will need ERO next. Plan for shoulder eval and decided on future management for  both episodes.     Consulted and Agree with Plan of Care Patient      Patient will benefit from skilled therapeutic intervention in order to improve the following deficits and impairments:  Abnormal gait, Decreased activity tolerance, Decreased balance, Decreased cognition, Decreased endurance, Decreased mobility, Decreased safety awareness, Decreased strength, Difficulty walking  Visit Diagnosis: Weakness generalized  Unsteadiness on feet  Difficulty in walking, not elsewhere classified  Chronic bilateral low back pain without sciatica     Problem List Patient Active Problem List   Diagnosis Date Noted  . Influenza A (H1N1) 08/07/2014  . Abnormality of gait 11/05/2013  . OSA (obstructive sleep apnea) 07/23/2013  . Erectile dysfunction 07/23/2013  . NSTEMI (non-ST elevated myocardial infarction) ([place]) 04/16/2013  . Anginal chest pain at rest [facility]) 04/15/2013  . CAD (coronary artery disease), Hx of LAD DES in 2005 04/15/2013  . Peripheral neuropathy 04/15/2013  . Hyperlipidemia with target LDL less than 70 04/15/2013    COCHRAN,JENNIFER, PTA 10/10/2016, 3:56 PM  [facility] [address], [place], [place], [address] Phone: [phone]   Fax:  [phone]  Name: Geral Coker. MRN: [id number] Date of Birth: 11/11/1927

## 2016-10-12 ENCOUNTER — Ambulatory Visit: Payer: Medicare Other | Admitting: Physical Therapy

## 2016-10-12 DIAGNOSIS — R2681 Unsteadiness on feet: Secondary | ICD-10-CM | POA: Diagnosis not present

## 2016-10-12 DIAGNOSIS — M545 Low back pain: Secondary | ICD-10-CM | POA: Diagnosis not present

## 2016-10-12 DIAGNOSIS — R531 Weakness: Secondary | ICD-10-CM | POA: Diagnosis not present

## 2016-10-12 DIAGNOSIS — R262 Difficulty in walking, not elsewhere classified: Secondary | ICD-10-CM

## 2016-10-12 DIAGNOSIS — G8929 Other chronic pain: Secondary | ICD-10-CM | POA: Diagnosis not present

## 2016-10-12 NOTE — Therapy (Signed)
[facility] [address] [place], [place], [address] Phone: [phone]   Fax:  [phone]  Physical Therapy Treatment  Patient Details  Name: Corey Larson. MRN: [id number] Date of Birth: 06-21-1927 Referring Provider: Ashby Dawes  Encounter Date: 10/12/2016      PT End of Session - 10/12/16 1128    Visit Number 13   Number of Visits 19   Date for PT Re-Evaluation 10/19/16   PT Start Time 1105   PT Stop Time 1144   PT Time Calculation (min) 39 min   Activity Tolerance Patient tolerated treatment well      Past Medical History:  Diagnosis Date  . Abnormality of gait 11/05/2013  . CAD (coronary artery disease) 2005   stent to LAD by Dr. Glade Lloyd. mild irregulatities of RCA.    [name] CRF (chronic renal failure)   . ED (erectile dysfunction)   . H/O exercise stress test 05/2012   no statistically significant ischemia.   [name] History of TIA (transient ischemic attack)    transient confusion  . HOH (hard of hearing)    hearing aids  . Hyperlipidemia LDL goal < 70   . Hypertension   . Insomnia   . Macular degeneration    bilateral  . Macular degeneration    Right>left  . OSA (obstructive sleep apnea)    mild, never on cpap  . Peripheral neuropathy   . Vitamin D deficiency     Past Surgical History:  Procedure Laterality Date  . ANKLE SURGERY     right ankle 2002 fracture  . BACK SURGERY  1976   laminectomy  . CARDIAC CATHETERIZATION     2005, stent placed DES LAD  . CATARACT EXTRACTION     bilateral  . CORONARY ANGIOPLASTY    . HERNIA REPAIR  1981   "double hernia"  . KNEE SURGERY     right knee 2011   . LEFT HEART CATHETERIZATION WITH CORONARY ANGIOGRAM N/A 04/16/2013   Procedure: LEFT HEART CATHETERIZATION WITH CORONARY ANGIOGRAM;  Surgeon: Leonie Man, MD;  Location: [facility] CATH LAB;  Service: Cardiovascular;  Laterality: N/A;  . NM MYOCAR MULTIPLE W/SPECT  05/13/2012   MILD UPPER SEPTAL  THINNING AND DIAPHRAGMATIC ATTENUATION W/O SIGNIFICANT ISCHEMIA. EF 66%.  . TRANSTHORACIC ECHOCARDIOGRAM  05/12/2010   EF =>55%. MILD CONCENTRIC LVH. TRACE MITRAL REGURG.    There were no vitals filed for this visit.      Subjective Assessment - 10/12/16 1109    Subjective Going out of town next week to [place].  Had an injection in shoulder which helped.  My balance is the biggest issue.     Currently in Pain? No/denies   Pain Score 0-No pain            [facility] PT Assessment - 10/12/16 0001      Strength   Overall Strength Comments R hip flex 3+, knee ext 4, knee flex 3-, DF 3-, hip ext 3+/5; L hip flex 4- and all others 4/5     Berg Balance Test   Sit to Stand Able to stand  independently using hands   Standing Unsupported Able to stand 2 minutes with supervision   Sitting with Back Unsupported but Feet Supported on Floor or Stool Able to sit safely and securely 2 minutes   Stand to Sit Sits safely with minimal use of hands   Transfers Able to transfer safely, definite need of hands   Standing Unsupported with  Eyes Closed Able to stand 3 seconds   Standing Ubsupported with Feet Together Able to place feet together independently but unable to hold for 30 seconds   From Standing, Reach Forward with Outstretched Arm Can reach forward >5 cm safely (2")   From Standing Position, Pick up Object from [place] to pick up shoe, needs supervision   From Standing Position, Turn to Look Behind Over each Shoulder Turn sideways only but maintains balance   Turn 360 Degrees Needs close supervision or verbal cueing   Standing Unsupported, Alternately Place Feet on Step/Stool Needs assistance to keep from falling or unable to try   Standing Unsupported, One Foot in Front Loses balance while stepping or standing   Standing on One Leg Unable to try or needs assist to prevent fall   Total Score 29                     [facility] Adult PT Treatment/Exercise - 10/12/16 0001       Therapeutic Activites    Other Therapeutic Activities sit to stand     Neuro Re-ed    Neuro Re-ed Details  static standing; dynamic standing balance     Knee/Hip Exercises: Aerobic   Nustep Level 2 x 10 minutes    PTA  present to discuss progress with patient.     Knee/Hip Exercises: Standing   Heel Raises Both;1 set;10 reps   Hip Abduction AROM;Right;Left;10 reps   Hip Extension Stengthening;Right;Left;10 reps;Knee straight   Extension Limitations red band   Other Standing Knee Exercises step taps 10x without UE support    Other Standing Knee Exercises tandem stand and narrow base of support with head movements and UE lifts     Knee/Hip Exercises: Seated   Sit to Sand 1 set;5 reps;without UE support                  PT Short Term Goals - 10/12/16 1119      PT SHORT TERM GOAL #1   Title Pt will be independent in initial HEP   Status Achieved     PT SHORT TERM GOAL #2   Title Pt will perform 10 STS without UEs without LOB in between reps to assist with safer transfers at home   Status Achieved     PT SHORT TERM GOAL #3   Title Pt will demo improved Berg to 30/56 for decreased falls risk   Time 3   Period Weeks   Status Partially Met           PT Long Term Goals - 10/12/16 1122      PT LONG TERM GOAL #1   Title Pt will demo improved Berg to >/= 45/56 for decreased falls risk   Time 6   Period Weeks   Status On-going     PT LONG TERM GOAL #2   Title Pt will report not needing to assist R LE into car   Time 6   Period Weeks   Status Partially Met     PT LONG TERM GOAL #3   Title Pt will demo improved bil LE strength >/= 1 MMT grade to assist with more stability with gait   Time 6   Period Weeks   Status On-going     PT LONG TERM GOAL #4   Title Pt will demo improved stability with ambulation with RW or least restrictive assistive device level/unlevel surfaces to assist with out of home outings  Baseline pt has 2 walkers and is not using the  walkers   Time 6   Period Weeks   Status On-going               Plan - 10/12/16 1148    Clinical Impression Statement The patient has a new PT order for shoulder pain but would like to hold on that to continue focus on his balance.  He had a recent injection in his shoulder which has helped a lot.  His BERG score has improved from 26/56 to 29/56 however he is still at very high risk of falls (100%).  We have recommended that he use a rolling walker on a full-time basis secondary to his fall risk and he has purchased 2 however he declines to use them stating, "I don't think I need it right now."  He has improved with hip flexion and extension strength making it easier to get his leg in/out of the car and to rise from sit to stand.  Recommend continued PT with focus on static and dynamic standing balance to reduce risk of falls.    Rehab Potential Good   PT Frequency 2x / week   PT Duration 6 weeks   PT Treatment/Interventions ADLs/Self Care Home Management;Cryotherapy;Functional mobility training;Stair training;Gait training;Therapeutic activities;Therapeutic exercise;Balance training;Neuromuscular re-education;Patient/family education;Passive range of motion;Manual techniques;Energy conservation   PT Next Visit Plan Standing balance with narrow base of support, UE and head movements, weight shifting   Recommended Other Services Hold PT order for shoulder until after current plan of care      Patient will benefit from skilled therapeutic intervention in order to improve the following deficits and impairments:  Abnormal gait, Decreased activity tolerance, Decreased balance, Decreased cognition, Decreased endurance, Decreased mobility, Decreased safety awareness, Decreased strength, Difficulty walking  Visit Diagnosis: Weakness generalized - Plan: PT plan of care cert/re-cert  Unsteadiness on feet - Plan: PT plan of care cert/re-cert  Difficulty in walking, not elsewhere classified -  Plan: PT plan of care cert/re-cert     Problem List Patient Active Problem List   Diagnosis Date Noted  . Influenza A (H1N1) 08/07/2014  . Abnormality of gait 11/05/2013  . OSA (obstructive sleep apnea) 07/23/2013  . Erectile dysfunction 07/23/2013  . NSTEMI (non-ST elevated myocardial infarction) ([place]) 04/16/2013  . Anginal chest pain at rest [facility]) 04/15/2013  . CAD (coronary artery disease), Hx of LAD DES in 2005 04/15/2013  . Peripheral neuropathy 04/15/2013  . Hyperlipidemia with target LDL less than 70 04/15/2013    Ruben Im C 10/12/2016, 12:01 PM  [facility] [address], [place], [place], [address] Phone: [phone]   Fax:  [phone]  Name: Corey Larson. MRN: [id number] Date of Birth: 11-Jan-1928

## 2016-10-12 NOTE — Therapy (Signed)
[facility] [address] [place], [place], [address] Phone: [phone]   Fax:  [phone]  Physical Therapy Treatment/Recertification  Patient Details  Name: Corey Larson. MRN: [id number] Date of Birth: Mar 12, 1928 Referring Provider: Ashby Dawes  Encounter Date: 10/12/2016      PT End of Session - 10/12/16 1128    Visit Number 13   Number of Visits 19   Date for PT Re-Evaluation 11/23/16   PT Start Time 1105   PT Stop Time 1144   PT Time Calculation (min) 39 min   Activity Tolerance Patient tolerated treatment well      Past Medical History:  Diagnosis Date  . Abnormality of gait 11/05/2013  . CAD (coronary artery disease) 2005   stent to LAD by Dr. Glade Lloyd. mild irregulatities of RCA.    [name] CRF (chronic renal failure)   . ED (erectile dysfunction)   . H/O exercise stress test 05/2012   no statistically significant ischemia.   [name] History of TIA (transient ischemic attack)    transient confusion  . HOH (hard of hearing)    hearing aids  . Hyperlipidemia LDL goal < 70   . Hypertension   . Insomnia   . Macular degeneration    bilateral  . Macular degeneration    Right>left  . OSA (obstructive sleep apnea)    mild, never on cpap  . Peripheral neuropathy   . Vitamin D deficiency     Past Surgical History:  Procedure Laterality Date  . ANKLE SURGERY     right ankle 2002 fracture  . BACK SURGERY  1976   laminectomy  . CARDIAC CATHETERIZATION     2005, stent placed DES LAD  . CATARACT EXTRACTION     bilateral  . CORONARY ANGIOPLASTY    . HERNIA REPAIR  1981   "double hernia"  . KNEE SURGERY     right knee 2011   . LEFT HEART CATHETERIZATION WITH CORONARY ANGIOGRAM N/A 04/16/2013   Procedure: LEFT HEART CATHETERIZATION WITH CORONARY ANGIOGRAM;  Surgeon: Leonie Man, MD;  Location[facility] C[facility] LAB;  Service: Cardiovascular;  Laterality: N/A;  . NM MYOCAR MULTIPLE W/SPECT  05/13/2012   MILD UPPER SEPTAL THINNING AND DIAPHRAGMATIC ATTENUATION W/O SIGNIFICANT ISCHEMIA. EF 66%.  . TRANSTHORACIC ECHOCARDIOGRAM  05/12/2010   EF =>55%. MILD CONCENTRIC LVH. TRACE MITRAL REGURG.    There were no vitals filed for this visit.      Subjective Assessment - 10/12/16 1109    Subjective Going out of town next week to [place].  Had an injection in shoulder which helped.  My balance is the biggest issue.     Currently in Pain? No/denies   Pain Score 0-No pain            [facility] PT Assessment - 10/12/16 0001      Strength   Overall Strength Comments R hip flex 3+, knee ext 4, knee flex 3-, DF 3-, hip ext 3+/5; L hip flex 4- and all others 4/5     Berg Balance Test   Sit to Stand Able to stand  independently using hands   Standing Unsupported Able to stand 2 minutes with supervision   Sitting with Back Unsupported but Feet Supported on Floor or Stool Able to sit safely and securely 2 minutes   Stand to Sit Sits safely with minimal use of hands   Transfers Able to transfer safely, definite need of hands   Standing Unsupported with Eyes  Closed Able to stand 3 seconds   Standing Ubsupported with Feet Together Able to place feet together independently but unable to hold for 30 seconds   From Standing, Reach Forward with Outstretched Arm Can reach forward >5 cm safely (2")   From Standing Position, Pick up Object from [place] to pick up shoe, needs supervision   From Standing Position, Turn to Look Behind Over each Shoulder Turn sideways only but maintains balance   Turn 360 Degrees Needs close supervision or verbal cueing   Standing Unsupported, Alternately Place Feet on Step/Stool Needs assistance to keep from falling or unable to try   Standing Unsupported, One Foot in Front Loses balance while stepping or standing   Standing on One Leg Unable to try or needs assist to prevent fall   Total Score 29                     [facility] Adult PT Treatment/Exercise - 10/12/16 0001       Therapeutic Activites    Other Therapeutic Activities sit to stand     Neuro Re-ed    Neuro Re-ed Details  static standing; dynamic standing balance     Knee/Hip Exercises: Aerobic   Nustep Level 2 x 10 minutes    PT  present to discuss progress with patient.     Knee/Hip Exercises: Standing   Heel Raises Both;1 set;10 reps   Hip Abduction AROM;Right;Left;10 reps   Hip Extension Stengthening;Right;Left;10 reps;Knee straight   Extension Limitations red band   Other Standing Knee Exercises step taps 10x without UE support    Other Standing Knee Exercises tandem stand and narrow base of support with head movements and UE lifts     Knee/Hip Exercises: Seated   Sit to Sand 1 set;5 reps;without UE support                  PT Short Term Goals - 10/12/16 1119      PT SHORT TERM GOAL #1   Title Pt will be independent in initial HEP   Status Achieved     PT SHORT TERM GOAL #2   Title Pt will perform 10 STS without UEs without LOB in between reps to assist with safer transfers at home   Status Achieved     PT SHORT TERM GOAL #3   Title Pt will demo improved Berg to 30/56 for decreased falls risk   Time 3   Period Weeks   Status Partially Met           PT Long Term Goals - 10/12/16 1122      PT LONG TERM GOAL #1   Title Pt will demo improved Berg to >/= 45/56 for decreased falls risk   Time 6   Period Weeks   Status On-going     PT LONG TERM GOAL #2   Title Pt will report not needing to assist R LE into car   Time 6   Period Weeks   Status Partially Met     PT LONG TERM GOAL #3   Title Pt will demo improved bil LE strength >/= 1 MMT grade to assist with more stability with gait   Time 6   Period Weeks   Status On-going     PT LONG TERM GOAL #4   Title Pt will demo improved stability with ambulation with RW or least restrictive assistive device level/unlevel surfaces to assist with out of home outings   Baseline  pt has 2 walkers and is not using  the walkers   Time 6   Period Weeks   Status On-going               Plan - 10/12/16 1148    Clinical Impression Statement The patient has a new PT order for shoulder pain but would like to hold on that to continue focus on his balance.  He had a recent injection in his shoulder which has helped a lot.  His BERG score has improved from 26/56 to 29/56 however he is still at very high risk of falls (100%).  We have recommended that he use a rolling walker on a full-time basis secondary to his fall risk and he has purchased 2 however he declines to use them stating, "I don't think I need it right now."  He has improved with hip flexion and extension strength making it easier to get his leg in/out of the car and to rise from sit to stand.  Recommend continued PT with focus on static and dynamic standing balance to reduce risk of falls.    Rehab Potential Good   PT Frequency 2x / week   PT Duration 6 weeks   PT Treatment/Interventions ADLs/Self Care Home Management;Cryotherapy;Functional mobility training;Stair training;Gait training;Therapeutic activities;Therapeutic exercise;Balance training;Neuromuscular re-education;Patient/family education;Passive range of motion;Manual techniques;Energy conservation   PT Next Visit Plan Standing balance with narrow base of support, UE and head movements, weight shifting   Recommended Other Services Hold PT order for shoulder until after current plan of care      Patient will benefit from skilled therapeutic intervention in order to improve the following deficits and impairments:  Abnormal gait, Decreased activity tolerance, Decreased balance, Decreased cognition, Decreased endurance, Decreased mobility, Decreased safety awareness, Decreased strength, Difficulty walking  Visit Diagnosis: Weakness generalized - Plan: PT plan of care cert/re-cert  Unsteadiness on feet - Plan: PT plan of care cert/re-cert  Difficulty in walking, not elsewhere classified -  Plan: PT plan of care cert/re-cert     Problem List Patient Active Problem List   Diagnosis Date Noted  . Influenza A (H1N1) 08/07/2014  . Abnormality of gait 11/05/2013  . OSA (obstructive sleep apnea) 07/23/2013  . Erectile dysfunction 07/23/2013  . NSTEMI (non-ST elevated myocardial infarction) ([place]) 04/16/2013  . Anginal chest pain at rest [facility]) 04/15/2013  . CAD (coronary artery disease), Hx of LAD DES in 2005 04/15/2013  . Peripheral neuropathy 04/15/2013  . Hyperlipidemia with target LDL less than 70 04/15/2013   Ruben Im, PT 10/12/16 12:04 PM Phone: [phone] Fax: [phone]  Alvera Singh 10/12/2016, 12:02 PM  [facility] [address], [place], [place], [address] Phone: [phone]   Fax:  [phone]  Name: Corey Larson. MRN: [id number] Date of Birth: 1928/03/25

## 2016-10-22 ENCOUNTER — Encounter: Payer: Self-pay | Admitting: Physical Therapy

## 2016-10-22 ENCOUNTER — Ambulatory Visit: Payer: Medicare Other | Admitting: Physical Therapy

## 2016-10-22 DIAGNOSIS — R262 Difficulty in walking, not elsewhere classified: Secondary | ICD-10-CM

## 2016-10-22 DIAGNOSIS — R2681 Unsteadiness on feet: Secondary | ICD-10-CM | POA: Diagnosis not present

## 2016-10-22 DIAGNOSIS — M545 Low back pain: Secondary | ICD-10-CM | POA: Diagnosis not present

## 2016-10-22 DIAGNOSIS — G8929 Other chronic pain: Secondary | ICD-10-CM

## 2016-10-22 DIAGNOSIS — R531 Weakness: Secondary | ICD-10-CM | POA: Diagnosis not present
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
## 2016-10-24 ENCOUNTER — Encounter: Payer: Self-pay | Admitting: Physical Therapy

## 2016-10-24 ENCOUNTER — Ambulatory Visit: Payer: Medicare Other | Admitting: Physical Therapy

## 2016-10-24 DIAGNOSIS — R2681 Unsteadiness on feet: Secondary | ICD-10-CM

## 2016-10-24 DIAGNOSIS — R531 Weakness: Secondary | ICD-10-CM

## 2016-10-24 DIAGNOSIS — M545 Low back pain: Secondary | ICD-10-CM | POA: Diagnosis not present

## 2016-10-24 DIAGNOSIS — R262 Difficulty in walking, not elsewhere classified: Secondary | ICD-10-CM

## 2016-10-24 DIAGNOSIS — G8929 Other chronic pain: Secondary | ICD-10-CM

## 2016-10-24 NOTE — Therapy (Signed)
[facility] [address] [place], [place], [address] Phone: [phone]   Fax:  [phone]  Physical Therapy Treatment  Patient Details  Name: Corey Larson. MRN: [id number] Date of Birth: December 09, 1927 Referring Provider: Ashby Dawes  Encounter Date: 10/24/2016      PT End of Session - 10/24/16 1059    Visit Number 15   Number of Visits 19   Date for PT Re-Evaluation 11/23/16   PT Start Time 1100   PT Stop Time 1140   PT Time Calculation (min) 40 min   Activity Tolerance Patient tolerated treatment well   Behavior During Therapy [facility] for tasks assessed/performed      Past Medical History:  Diagnosis Date  . Abnormality of gait 11/05/2013  . CAD (coronary artery disease) 2005   stent to LAD by Dr. Glade Lloyd. mild irregulatities of RCA.    [name] CRF (chronic renal failure)   . ED (erectile dysfunction)   . H/O exercise stress test 05/2012   no statistically significant ischemia.   [name] History of TIA (transient ischemic attack)    transient confusion  . HOH (hard of hearing)    hearing aids  . Hyperlipidemia LDL goal < 70   . Hypertension   . Insomnia   . Macular degeneration    bilateral  . Macular degeneration    Right>left  . OSA (obstructive sleep apnea)    mild, never on cpap  . Peripheral neuropathy   . Vitamin D deficiency     Past Surgical History:  Procedure Laterality Date  . ANKLE SURGERY     right ankle 2002 fracture  . BACK SURGERY  1976   laminectomy  . CARDIAC CATHETERIZATION     2005, stent placed DES LAD  . CATARACT EXTRACTION     bilateral  . CORONARY ANGIOPLASTY    . HERNIA REPAIR  1981   "double hernia"  . KNEE SURGERY     right knee 2011   . LEFT HEART CATHETERIZATION WITH CORONARY ANGIOGRAM N/A 04/16/2013   Procedure: LEFT HEART CATHETERIZATION WITH CORONARY ANGIOGRAM;  Surgeon: Leonie Man, MD;  Location: [facility] CATH LAB;  Service: Cardiovascular;  Laterality: N/A;  .  NM MYOCAR MULTIPLE W/SPECT  05/13/2012   MILD UPPER SEPTAL THINNING AND DIAPHRAGMATIC ATTENUATION W/O SIGNIFICANT ISCHEMIA. EF 66%.  . TRANSTHORACIC ECHOCARDIOGRAM  05/12/2010   EF =>55%. MILD CONCENTRIC LVH. TRACE MITRAL REGURG.    There were no vitals filed for this visit.      Subjective Assessment - 10/24/16 1101    Subjective Feeling ok today, no new complaints.   Currently in Pain? No/denies   Multiple Pain Sites No                         [facility] Adult PT Treatment/Exercise - 10/24/16 0001      High Level Balance   High Level Balance Activities Side stepping;Head turns;Marching forwards  weight shifting on mini tramp 1 min 3 ways, CGA on all dynam   High Level Balance Comments Ball tossing 20x, narrow BOS, tandem each way CGA     Neuro Re-ed    Neuro Re-ed Details  --  Ladder balance exercises;side stepping and forward march HHA     Knee/Hip Exercises: Stretches   Press photographer Both;2 reps;20 seconds     Knee/Hip Exercises: Aerobic   Nustep Level 2 x 10 minutes    PTA  present to discuss progress with  patient.     Knee/Hip Exercises: Standing   Rebounder weight shifting 3 ways 1 min  VC for posture   Other Standing Knee Exercises step taps 20x without UE support    Other Standing Knee Exercises Walking in clinic on level surface; 160 feet x2 with posture focus and picking his feet up. SBA      Knee/Hip Exercises: Seated   Clamshell with TheraBand --  25x green, Vc to sit tall when performing ex                  PT Short Term Goals - 10/12/16 1119      PT SHORT TERM GOAL #1   Title Pt will be independent in initial HEP   Status Achieved     PT SHORT TERM GOAL #2   Title Pt will perform 10 STS without UEs without LOB in between reps to assist with safer transfers at home   Status Achieved     PT SHORT TERM GOAL #3   Title Pt will demo improved Berg to 30/56 for decreased falls risk   Time 3   Period Weeks   Status Partially  Met           PT Long Term Goals - 10/12/16 1122      PT LONG TERM GOAL #1   Title Pt will demo improved Berg to >/= 45/56 for decreased falls risk   Time 6   Period Weeks   Status On-going     PT LONG TERM GOAL #2   Title Pt will report not needing to assist R LE into car   Time 6   Period Weeks   Status Partially Met     PT LONG TERM GOAL #3   Title Pt will demo improved bil LE strength >/= 1 MMT grade to assist with more stability with gait   Time 6   Period Weeks   Status On-going     PT LONG TERM GOAL #4   Title Pt will demo improved stability with ambulation with RW or least restrictive assistive device level/unlevel surfaces to assist with out of home outings   Baseline pt has 2 walkers and is not using the walkers   Time 6   Period Weeks   Status On-going               Plan - 10/24/16 1059    Clinical Impression Statement Pt forgot his hearing aides making communication difficult today. When asked to perform higher level balance exercises with the floor ladder pt grabbed heavily to PTA hands for assiatance to maintain balance.  Pt is constantly reminded to get up out of his chair slowly and not "take off." He tends to be very wobbly when he does this.  Continues to not use assistive device.    Rehab Potential Good   PT Frequency 2x / week   PT Duration 6 weeks   PT Treatment/Interventions ADLs/Self Care Home Management;Cryotherapy;Functional mobility training;Stair training;Gait training;Therapeutic activities;Therapeutic exercise;Balance training;Neuromuscular re-education;Patient/family education;Passive range of motion;Manual techniques;Energy conservation   PT Next Visit Plan Standing balance with narrow base of support, UE and head movements, weight shifting   Consulted and Agree with Plan of Care Patient      Patient will benefit from skilled therapeutic intervention in order to improve the following deficits and impairments:  Abnormal gait,  Decreased activity tolerance, Decreased balance, Decreased cognition, Decreased endurance, Decreased mobility, Decreased safety awareness, Decreased strength, Difficulty walking  Visit Diagnosis:  Weakness generalized  Unsteadiness on feet  Difficulty in walking, not elsewhere classified  Chronic bilateral low back pain without sciatica     Problem List Patient Active Problem List   Diagnosis Date Noted  . Influenza A (H1N1) 08/07/2014  . Abnormality of gait 11/05/2013  . OSA (obstructive sleep apnea) 07/23/2013  . Erectile dysfunction 07/23/2013  . NSTEMI (non-ST elevated myocardial infarction) ([place]) 04/16/2013  . Anginal chest pain at rest [facility]) 04/15/2013  . CAD (coronary artery disease), Hx of LAD DES in 2005 04/15/2013  . Peripheral neuropathy 04/15/2013  . Hyperlipidemia with target LDL less than 70 04/15/2013    Koua Deeg, PTA 10/24/2016, 11:34 AM  [facility] [address] [place], [place], [address] Phone: [phone]   Fax:  [phone]  Name: Lebron Nauert. MRN: [id number] Date of Birth: [birth year]

## 2016-10-26 ENCOUNTER — Encounter: Payer: Medicare Other | Admitting: Physical Therapy

## 2016-10-30 ENCOUNTER — Ambulatory Visit: Payer: Medicare Other | Admitting: Physical Therapy

## 2016-10-30 DIAGNOSIS — M545 Low back pain: Secondary | ICD-10-CM | POA: Diagnosis not present

## 2016-10-30 DIAGNOSIS — R262 Difficulty in walking, not elsewhere classified: Secondary | ICD-10-CM | POA: Diagnosis not present

## 2016-10-30 DIAGNOSIS — R531 Weakness: Secondary | ICD-10-CM

## 2016-10-30 DIAGNOSIS — R2681 Unsteadiness on feet: Secondary | ICD-10-CM

## 2016-10-30 DIAGNOSIS — G8929 Other chronic pain: Secondary | ICD-10-CM | POA: Diagnosis not present

## 2016-10-30 NOTE — Therapy (Signed)
[facility] [address] [place], [place], [address] Phone: [phone]   Fax:  [phone]  Physical Therapy Treatment  Patient Details  Name: Corey Larson. MRN: [id number] Date of Birth: 12-30-1927 Referring Provider: Ashby Dawes  Encounter Date: 10/30/2016      PT End of Session - 10/30/16 1202    Visit Number 16   Number of Visits 19   Date for PT Re-Evaluation 11/23/16   Authorization Type Medicare G codes:  KX at visit 14   PT Start Time 1148   PT Stop Time 1226   PT Time Calculation (min) 38 min   Activity Tolerance Patient tolerated treatment well      Past Medical History:  Diagnosis Date  . Abnormality of gait 11/05/2013  . CAD (coronary artery disease) 2005   stent to LAD by Dr. Glade Lloyd. mild irregulatities of RCA.    [name] CRF (chronic renal failure)   . ED (erectile dysfunction)   . H/O exercise stress test 05/2012   no statistically significant ischemia.   [name] History of TIA (transient ischemic attack)    transient confusion  . HOH (hard of hearing)    hearing aids  . Hyperlipidemia LDL goal < 70   . Hypertension   . Insomnia   . Macular degeneration    bilateral  . Macular degeneration    Right>left  . OSA (obstructive sleep apnea)    mild, never on cpap  . Peripheral neuropathy   . Vitamin D deficiency     Past Surgical History:  Procedure Laterality Date  . ANKLE SURGERY     right ankle 2002 fracture  . BACK SURGERY  1976   laminectomy  . CARDIAC CATHETERIZATION     2005, stent placed DES LAD  . CATARACT EXTRACTION     bilateral  . CORONARY ANGIOPLASTY    . HERNIA REPAIR  1981   "double hernia"  . KNEE SURGERY     right knee 2011   . LEFT HEART CATHETERIZATION WITH CORONARY ANGIOGRAM N/A 04/16/2013   Procedure: LEFT HEART CATHETERIZATION WITH CORONARY ANGIOGRAM;  Surgeon: Leonie Man, MD;  Location: [facility] C[facility] LAB;  Service: Cardiovascular;  Laterality: N/A;  . NM  MYOCAR MULTIPLE W/SPECT  05/13/2012   MILD UPPER SEPTAL THINNING AND DIAPHRAGMATIC ATTENUATION W/O SIGNIFICANT ISCHEMIA. EF 66%.  . TRANSTHORACIC ECHOCARDIOGRAM  05/12/2010   EF =>55%. MILD CONCENTRIC LVH. TRACE MITRAL REGURG.    There were no vitals filed for this visit.      Subjective Assessment - 10/30/16 1149    Subjective Patient returns after performing his grandchild's wedding ceremony.  States his balance was OK but someone helped him on the steps.  Stooped posture and short shuffled steps.     Currently in Pain? No/denies   Pain Score 0-No pain   Aggravating Factors  a lot of activity                         [facility] Adult PT Treatment/Exercise - 10/30/16 0001      Therapeutic Activites    Other Therapeutic Activities sit to stand, standing, walking, up and down stairs     Neuro Re-ed    Neuro Re-ed Details  static standing; dynamic standing balance; transverse abdominues activation      Lumbar Exercises: Seated   Other Seated Lumbar Exercises foam roll push down for core activation     Knee/Hip Exercises: Aerobic  Nustep Level 3 x 10 minutes    PTA  present to discuss progress with patient.     Knee/Hip Exercises: Standing   Heel Raises Both;1 set;10 reps   Hip Abduction AROM;Right;Left;10 reps   Hip Extension AROM;Right;Left;10 reps   Other Standing Knee Exercises against wall pull aways, UE movements, head movements to challenge balance   Other Standing Knee Exercises hip extension and abduction isometrics 5x 5sec holds      Knee/Hip Exercises: Seated   Long Arc Quad Strengthening;Right;Left;10 reps   Long Arc Quad Limitations red bamd   Knee/Hip Flexion red band 10x right/left   Sit to [employer] 1 set;5 reps;without UE support                  PT Short Term Goals - 10/30/16 1206      PT SHORT TERM GOAL #1   Title Pt will be independent in initial HEP   Status Achieved     PT SHORT TERM GOAL #2   Title Pt will perform 10 STS  without UEs without LOB in between reps to assist with safer transfers at home   Status Achieved     PT SHORT TERM GOAL #3   Title Pt will demo improved Berg to 30/56 for decreased falls risk   Time 3   Period Weeks   Status Partially Met           PT Long Term Goals - 10/30/16 1206      PT LONG TERM GOAL #1   Title Pt will demo improved Berg to >/= 45/56 for decreased falls risk   Time 6   Period Weeks   Status On-going     PT LONG TERM GOAL #2   Title Pt will report not needing to assist R LE into car   Time 6   Period Weeks   Status Partially Met     PT LONG TERM GOAL #3   Title Pt will demo improved bil LE strength >/= 1 MMT grade to assist with more stability with gait   Time 6   Period Weeks   Status On-going     PT LONG TERM GOAL #4   Title Pt will demo improved stability with ambulation with RW or least restrictive assistive device level/unlevel surfaces to assist with out of home outings   Time 6   Period Weeks   Status On-going               Plan - 10/30/16 1203    Clinical Impression Statement The patient has increased forward lean and shuffled steps today.  Needs close supervision ambulating in the clinic for safety.  He states he is not using his cane or walker at all but states he uses the railing when doing stairs.  Continues to be at high risk for falls.     PT Treatment/Interventions ADLs/Self Care Home Management;Cryotherapy;Functional mobility training;Stair training;Gait training;Therapeutic activities;Therapeutic exercise;Balance training;Neuromuscular re-education;Patient/family education;Passive range of motion;Manual techniques;Energy conservation   PT Next Visit Plan Standing balance with narrow base of support, UE and head movements, weight shifting;  KX modifiers      Patient will benefit from skilled therapeutic intervention in order to improve the following deficits and impairments:  Abnormal gait, Decreased activity tolerance,  Decreased balance, Decreased cognition, Decreased endurance, Decreased mobility, Decreased safety awareness, Decreased strength, Difficulty walking  Visit Diagnosis: Weakness generalized  Unsteadiness on feet  Difficulty in walking, not elsewhere classified     Problem List  Patient Active Problem List   Diagnosis Date Noted  . Influenza A (H1N1) 08/07/2014  . Abnormality of gait 11/05/2013  . OSA (obstructive sleep apnea) 07/23/2013  . Erectile dysfunction 07/23/2013  . NSTEMI (non-ST elevated myocardial infarction) ([place]) 04/16/2013  . Anginal chest pain at rest [facility]) 04/15/2013  . CAD (coronary artery disease), Hx of LAD DES in 2005 04/15/2013  . Peripheral neuropathy 04/15/2013  . Hyperlipidemia with target LDL less than 70 04/15/2013   Ruben Im, PT 10/30/16 12:24 PM Phone: [phone] Fax: [phone]  Alvera Singh 10/30/2016, 12:24 PM  [facility] [address], [place], [place], [address] Phone: [phone]   Fax:  [phone]  Name: Lelend Heinecke. MRN: [id number] Date of Birth: 10/25/1927

## 2016-10-31 DIAGNOSIS — H353221 Exudative age-related macular degeneration, left eye, with active choroidal neovascularization: Secondary | ICD-10-CM | POA: Diagnosis not present

## 2016-11-01 ENCOUNTER — Ambulatory Visit: Payer: Medicare Other | Admitting: Physical Therapy

## 2016-11-01 DIAGNOSIS — M545 Low back pain, unspecified: Secondary | ICD-10-CM

## 2016-11-01 DIAGNOSIS — R262 Difficulty in walking, not elsewhere classified: Secondary | ICD-10-CM

## 2016-11-01 DIAGNOSIS — R531 Weakness: Secondary | ICD-10-CM

## 2016-11-01 DIAGNOSIS — R2681 Unsteadiness on feet: Secondary | ICD-10-CM

## 2016-11-01 DIAGNOSIS — G8929 Other chronic pain: Secondary | ICD-10-CM

## 2016-11-01 NOTE — Therapy (Signed)
[facility] [address] [place], [place], [address] Phone: [phone]   Fax:  [phone]  Physical Therapy Treatment  Patient Details  Name: Corey Larson. MRN: [id number] Date of Birth: 07/08/1927 Referring Provider: Ashby Dawes  Encounter Date: 11/01/2016      PT End of Session - 11/01/16 1307    Visit Number 17   Number of Visits 19   Date for PT Re-Evaluation 11/23/16   Authorization Type Medicare G codes:  KX at visit 14   PT Start Time 1230   PT Stop Time 1315   PT Time Calculation (min) 45 min   Activity Tolerance Patient tolerated treatment well      Past Medical History:  Diagnosis Date  . Abnormality of gait 11/05/2013  . CAD (coronary artery disease) 2005   stent to LAD by Dr. Glade Lloyd. mild irregulatities of RCA.    [name] CRF (chronic renal failure)   . ED (erectile dysfunction)   . H/O exercise stress test 05/2012   no statistically significant ischemia.   [name] History of TIA (transient ischemic attack)    transient confusion  . HOH (hard of hearing)    hearing aids  . Hyperlipidemia LDL goal < 70   . Hypertension   . Insomnia   . Macular degeneration    bilateral  . Macular degeneration    Right>left  . OSA (obstructive sleep apnea)    mild, never on cpap  . Peripheral neuropathy   . Vitamin D deficiency     Past Surgical History:  Procedure Laterality Date  . ANKLE SURGERY     right ankle 2002 fracture  . BACK SURGERY  1976   laminectomy  . CARDIAC CATHETERIZATION     2005, stent placed DES LAD  . CATARACT EXTRACTION     bilateral  . CORONARY ANGIOPLASTY    . HERNIA REPAIR  1981   "double hernia"  . KNEE SURGERY     right knee 2011   . LEFT HEART CATHETERIZATION WITH CORONARY ANGIOGRAM N/A 04/16/2013   Procedure: LEFT HEART CATHETERIZATION WITH CORONARY ANGIOGRAM;  Surgeon: Leonie Man, MD;  Location: [facility] CATH LAB;  Service: Cardiovascular;  Laterality: N/A;  . NM  MYOCAR MULTIPLE W/SPECT  05/13/2012   MILD UPPER SEPTAL THINNING AND DIAPHRAGMATIC ATTENUATION W/O SIGNIFICANT ISCHEMIA. EF 66%.  . TRANSTHORACIC ECHOCARDIOGRAM  05/12/2010   EF =>55%. MILD CONCENTRIC LVH. TRACE MITRAL REGURG.    There were no vitals filed for this visit.      Subjective Assessment - 11/01/16 1231    Subjective No knee pain today but states he used ice on his right knee yesterday.   Not using an assistive device.    Currently in Pain? No/denies   Pain Score 0-No pain   Pain Type Chronic pain                         [facility] Adult PT Treatment/Exercise - 11/01/16 0001      Therapeutic Activites    Other Therapeutic Activities sit to stand, standing, walking, up and down stairs     Neuro Re-ed    Neuro Re-ed Details  narrow and tandem base of support with beach ball toss, head movments, arm movements and trunk rotations for dynamic balance     Lumbar Exercises: Standing   Other Standing Lumbar Exercises ball roll up wall for trunk extension     Lumbar Exercises: Seated  Other Seated Lumbar Exercises red plyo ball core movements with red band and ball between knees     Knee/Hip Exercises: Stretches   Active Hamstring Stretch Right;Left;3 reps;30 seconds   Active Hamstring Stretch Limitations seated   Gastroc Stretch Right;Left;3 reps;30 seconds   Gastroc Stretch Limitations seated with towel      Knee/Hip Exercises: Aerobic   Nustep Level 3 x 10 minutes    PTA  present to discuss progress with patient.     Knee/Hip Exercises: Standing   Heel Raises Both;1 set;10 reps   Other Standing Knee Exercises step taps 20x without UE support    Other Standing Knee Exercises weight shifting all directions with erect posture     Knee/Hip Exercises: Seated   Clamshell with TheraBand --  3x10   Sit to Sand 1 set;5 reps;without UE support  with red band around thighs                  PT Short Term Goals - 11/01/16 1311      PT SHORT TERM  GOAL #1   Title Pt will be independent in initial HEP   Status Achieved     PT SHORT TERM GOAL #2   Title Pt will perform 10 STS without UEs without LOB in between reps to assist with safer transfers at home   Status Achieved     PT SHORT TERM GOAL #3   Title Pt will demo improved Berg to 30/56 for decreased falls risk   Time 3   Period Weeks   Status Partially Met           PT Long Term Goals - 11/01/16 1311      PT LONG TERM GOAL #1   Title Pt will demo improved Berg to >/= 45/56 for decreased falls risk   Time 6   Period Weeks   Status On-going     PT LONG TERM GOAL #2   Title Pt will report not needing to assist R LE into car   Time 6   Period Weeks   Status Partially Met     PT LONG TERM GOAL #3   Title Pt will demo improved bil LE strength >/= 1 MMT grade to assist with more stability with gait   Time 6   Period Weeks   Status On-going     PT LONG TERM GOAL #4   Title Pt will demo improved stability with ambulation with RW or least restrictive assistive device level/unlevel surfaces to assist with out of home outings   Time 6   Period Weeks   Status On-going               Plan - 11/01/16 1308    Clinical Impression Statement The patient continues to have excessive forward lean and shuffled steps putting him at risk for falls.   He continues to decline the need for an assistive device even though he has assistive devices available to him at home.  Difficulty with single limb stability (as needed to ascend a curb) without UE support.  Needs heavy verbal cues for upright posture in standing.     PT Treatment/Interventions ADLs/Self Care Home Management;Cryotherapy;Functional mobility training;Stair training;Gait training;Therapeutic activities;Therapeutic exercise;Balance training;Neuromuscular re-education;Patient/family education;Passive range of motion;Manual techniques;Energy conservation   PT Next Visit Plan Standing balance with narrow base of  support, UE and head movements, weight shifting;  KX modifiers      Patient will benefit from skilled therapeutic intervention in order  to improve the following deficits and impairments:  Abnormal gait, Decreased activity tolerance, Decreased balance, Decreased cognition, Decreased endurance, Decreased mobility, Decreased safety awareness, Decreased strength, Difficulty walking  Visit Diagnosis: Weakness generalized  Unsteadiness on feet  Difficulty in walking, not elsewhere classified  Chronic bilateral low back pain without sciatica     Problem List Patient Active Problem List   Diagnosis Date Noted  . Influenza A (H1N1) 08/07/2014  . Abnormality of gait 11/05/2013  . OSA (obstructive sleep apnea) 07/23/2013  . Erectile dysfunction 07/23/2013  . NSTEMI (non-ST elevated myocardial infarction) ([place]) 04/16/2013  . Anginal chest pain at rest [facility]) 04/15/2013  . CAD (coronary artery disease), Hx of LAD DES in 2005 04/15/2013  . Peripheral neuropathy 04/15/2013  . Hyperlipidemia with target LDL less than 70 04/15/2013   Ruben Im, PT 11/01/16 1:13 PM Phone: [phone] Fax: [phone]  Alvera Singh 11/01/2016, 1:12 PM  [facility] [address], [place], [place], [address] Phone: [phone]   Fax:  [phone]  Name: Corey Larson. MRN: [id number] Date of Birth: 07/18/1927

## 2016-11-05 ENCOUNTER — Ambulatory Visit: Payer: Medicare Other | Admitting: Physical Therapy

## 2016-11-07 ENCOUNTER — Ambulatory Visit: Payer: Medicare Other | Attending: Internal Medicine | Admitting: Physical Therapy

## 2016-11-07 ENCOUNTER — Encounter: Payer: Self-pay | Admitting: Physical Therapy

## 2016-11-07 DIAGNOSIS — R2681 Unsteadiness on feet: Secondary | ICD-10-CM | POA: Diagnosis not present

## 2016-11-07 DIAGNOSIS — G8929 Other chronic pain: Secondary | ICD-10-CM | POA: Insufficient documentation

## 2016-11-07 DIAGNOSIS — R262 Difficulty in walking, not elsewhere classified: Secondary | ICD-10-CM | POA: Diagnosis not present

## 2016-11-07 DIAGNOSIS — M545 Low back pain: Secondary | ICD-10-CM | POA: Insufficient documentation

## 2016-11-07 DIAGNOSIS — R531 Weakness: Secondary | ICD-10-CM | POA: Diagnosis not present

## 2016-11-07 NOTE — Therapy (Signed)
[facility] [address] [place], [place], [address] Phone: [phone]   Fax:  [phone]  Physical Therapy Treatment  Patient Details  Name: Corey Larson. MRN: [id number] Date of Birth: 06/10/1927 Referring Provider: Ashby Dawes  Encounter Date: 11/07/2016      PT End of Session - 11/07/16 1109    Visit Number 18   Number of Visits 28   Date for PT Re-Evaluation 11/23/16   Authorization Type Medicare G codes:  KX at visit 14   PT Start Time 1102   PT Stop Time 1143   PT Time Calculation (min) 41 min   Activity Tolerance Patient tolerated treatment well   Behavior During Therapy [facility] for tasks assessed/performed      Past Medical History:  Diagnosis Date  . Abnormality of gait 11/05/2013  . CAD (coronary artery disease) 2005   stent to LAD by Dr. Glade Lloyd. mild irregulatities of RCA.    [name] CRF (chronic renal failure)   . ED (erectile dysfunction)   . H/O exercise stress test 05/2012   no statistically significant ischemia.   [name] History of TIA (transient ischemic attack)    transient confusion  . HOH (hard of hearing)    hearing aids  . Hyperlipidemia LDL goal < 70   . Hypertension   . Insomnia   . Macular degeneration    bilateral  . Macular degeneration    Right>left  . OSA (obstructive sleep apnea)    mild, never on cpap  . Peripheral neuropathy   . Vitamin D deficiency     Past Surgical History:  Procedure Laterality Date  . ANKLE SURGERY     right ankle 2002 fracture  . BACK SURGERY  1976   laminectomy  . CARDIAC CATHETERIZATION     2005, stent placed DES LAD  . CATARACT EXTRACTION     bilateral  . CORONARY ANGIOPLASTY    . HERNIA REPAIR  1981   "double hernia"  . KNEE SURGERY     right knee 2011   . LEFT HEART CATHETERIZATION WITH CORONARY ANGIOGRAM N/A 04/16/2013   Procedure: LEFT HEART CATHETERIZATION WITH CORONARY ANGIOGRAM;  Surgeon: Leonie Man, MD;  Location: [facility] CATH  LAB;  Service: Cardiovascular;  Laterality: N/A;  . NM MYOCAR MULTIPLE W/SPECT  05/13/2012   MILD UPPER SEPTAL THINNING AND DIAPHRAGMATIC ATTENUATION W/O SIGNIFICANT ISCHEMIA. EF 66%.  . TRANSTHORACIC ECHOCARDIOGRAM  05/12/2010   EF =>55%. MILD CONCENTRIC LVH. TRACE MITRAL REGURG.    There were no vitals filed for this visit.      Subjective Assessment - 11/07/16 1108    Subjective No knee pain today but states he used ice on his right knee yesterday.   Not using an assistive device. I went swimming the other day and it felt good but a couple hours later I could tell I went swimming.   Pertinent History Pt/friend report pt has had 2 falls in 2018 and balance is becoming more of an issue. Pt reluctant to use a RW. He has fallen at church due to decreased foot clearance and got up and fell in the middle of the night. Both parties report pt had a sudden decline. ? TIA. They are to consult with MD regarding due to suddenness and of physical and cognitive decline.   Limitations Standing;Walking   How long can you sit comfortably? > 1 hour   How long can you stand comfortably? < 5 minutes   How long  can you walk comfortably? < 5 minutes   Diagnostic tests none   Patient Stated Goals "to not fall anymore."   Currently in Pain? No/denies                         [facility] Adult PT Treatment/Exercise - 11/07/16 0001      Berg Balance Test   Sit to Stand Able to stand without using hands and stabilize independently   Standing Unsupported Able to stand safely 2 minutes   Sitting with Back Unsupported but Feet Supported on Floor or Stool Able to sit safely and securely 2 minutes   Stand to Sit Controls descent by using hands   Transfers Able to transfer safely, definite need of hands   Standing Unsupported with Eyes Closed Able to stand 10 seconds with supervision   Standing Ubsupported with Feet Together Able to place feet together independently but unable to hold for 30 seconds    From Standing, Reach Forward with Outstretched Arm Reaches forward but needs supervision   From Standing Position, Pick up Object from Floor Able to pick up shoe, needs supervision   From Standing Position, Turn to Look Behind Over each Shoulder Looks behind one side only/other side shows less weight shift   Turn 360 Degrees Able to turn 360 degrees safely but slowly   Standing Unsupported, Alternately Place Feet on Step/Stool Able to complete >2 steps/needs minimal assist   Standing Unsupported, One Foot in Front Able to take small step independently and hold 30 seconds   Standing on One Leg Tries to lift leg/unable to hold 3 seconds but remains standing independently   Total Score 36     Therapeutic Activites    Other Therapeutic Activities sit to stand, standing, walking, up and down stairs     Neuro Re-ed    Neuro Re-ed Details  narrow base of support, tandem standing without UE                PT Education - 11/07/16 1527    Education provided Yes   Education Details tandem standing   Person(s) Educated Patient   Methods Explanation;Demonstration;Tactile cues;Verbal cues;Handout   Comprehension Verbalized understanding;Returned demonstration          PT Short Term Goals - 11/07/16 1120      PT SHORT TERM GOAL #1   Title Pt will be independent in initial HEP   Time 3   Period Weeks   Status Achieved     PT SHORT TERM GOAL #2   Title Pt will perform 10 STS without UEs without LOB in between reps to assist with safer transfers at home   Time 3   Period Weeks   Status Achieved     PT SHORT TERM GOAL #3   Title Pt will demo improved Berg to 30/56 for decreased falls risk   Baseline 36/56 (11/07/16)   Time 3   Period Weeks   Status Achieved           PT Long Term Goals - 11/07/16 1116      PT LONG TERM GOAL #1   Title Pt will demo improved Berg to >/= 45/56 for decreased falls risk   Baseline 36/56 (11/07/16)   Time 6   Period Weeks   Status On-going      PT LONG TERM GOAL #2   Title Pt will report not needing to assist R LE into car   Time 6  Period Weeks   Status Achieved     PT LONG TERM GOAL #3   Title Pt will demo improved bil LE strength >/= 1 MMT grade to assist with more stability with gait   Time 6   Period Weeks   Status On-going     PT LONG TERM GOAL #4   Title Pt will demo improved stability with ambulation with RW or least restrictive assistive device level/unlevel surfaces to assist with out of home outings   Time 6   Period Weeks   Status On-going               Plan - 11/07/16 1504    Clinical Impression Statement Patient made progress and met short term goal with improved Berg score.  Pt continues to have reduced balance and is still at high risk for falls according to Berg score.  Pt needs cues for improved posture 50% of the time, but he is able to demonstrate improved posture overall since beginning PT.  Skilled PT needed for continued strength and stability in order to return safely    Rehab Potential Good   PT Treatment/Interventions ADLs/Self Care Home Management;Cryotherapy;Functional mobility training;Stair training;Gait training;Therapeutic activities;Therapeutic exercise;Balance training;Neuromuscular re-education;Patient/family education;Passive range of motion;Manual techniques;Energy conservation   PT Next Visit Plan Standing balance with narrow base of support, UE and head movements, weight shifting;  KX modifiers   Consulted and Agree with Plan of Care Patient      Patient will benefit from skilled therapeutic intervention in order to improve the following deficits and impairments:  Abnormal gait, Decreased activity tolerance, Decreased balance, Decreased cognition, Decreased endurance, Decreased mobility, Decreased safety awareness, Decreased strength, Difficulty walking  Visit Diagnosis: Weakness generalized  Unsteadiness on feet  Difficulty in walking, not elsewhere classified  Chronic  bilateral low back pain without sciatica     Problem List Patient Active Problem List   Diagnosis Date Noted  . Influenza A (H1N1) 08/07/2014  . Abnormality of gait 11/05/2013  . OSA (obstructive sleep apnea) 07/23/2013  . Erectile dysfunction 07/23/2013  . NSTEMI (non-ST elevated myocardial infarction) ([place]) 04/16/2013  . Anginal chest pain at rest [facility] 04/15/2013  . CAD (coronary artery disease), Hx of LAD DES in 2005 04/15/2013  . Peripheral neuropathy 04/15/2013  . Hyperlipidemia with target LDL less than 70 04/15/2013    Zannie Cove, PT 11/07/2016, 3:29 PM  [facility] [address], [place], [place], [address] Phone: [phone]   Fax:  [phone]  Name: Corey Larson. MRN: [id number] Date of Birth: 1927-09-04

## 2016-11-07 NOTE — Patient Instructions (Signed)
   Take a step forward with one foot and hold that position without using your hands Stand next to a counter in case you need to catch your balance  Do for 1 minute on each side, 2x/ day  [facility] [address] [place], [place] [address] Phone # [phone] Fax [phone]

## 2016-11-08 DIAGNOSIS — M1711 Unilateral primary osteoarthritis, right knee: Secondary | ICD-10-CM | POA: Diagnosis not present

## 2016-11-09 ENCOUNTER — Ambulatory Visit: Payer: Medicare Other | Admitting: Physical Therapy

## 2016-11-09 ENCOUNTER — Encounter: Payer: Self-pay | Admitting: Physical Therapy

## 2016-11-09 DIAGNOSIS — R2681 Unsteadiness on feet: Secondary | ICD-10-CM | POA: Diagnosis not present

## 2016-11-09 DIAGNOSIS — G8929 Other chronic pain: Secondary | ICD-10-CM | POA: Diagnosis not present

## 2016-11-09 DIAGNOSIS — R262 Difficulty in walking, not elsewhere classified: Secondary | ICD-10-CM | POA: Diagnosis not present

## 2016-11-09 DIAGNOSIS — R531 Weakness: Secondary | ICD-10-CM | POA: Diagnosis not present

## 2016-11-09 DIAGNOSIS — M545 Low back pain: Secondary | ICD-10-CM

## 2016-11-09 NOTE — Therapy (Signed)
[facility] [address], [place], [place], [address] Phone: [phone]   Fax:  [phone]  Physical Therapy Treatment  Patient Details  Name: Corey Larson. MRN: [id number] Date of Birth: 19-Apr-1928 Referring Provider: Ashby [place]es  Encounter Date: 11/09/2016      PT End of Session - 11/09/16 1103    Visit Number 19   Number of Visits 28   Date for PT Re-Evaluation 11/23/16   Authorization Type Medicare G codes:  KX at visit 14   PT Start Time 1101   PT Stop Time 1142   PT Time Calculation (min) 41 min   Activity Tolerance Patient limited by lethargy;Patient limited by fatigue   Behavior During Therapy [facility] for tasks assessed/performed;Impulsive      Past Medical History:  Diagnosis Date  . Abnormality of gait 11/05/2013  . CAD (coronary artery disease) 2005   stent to LAD by Dr. Glade Lloyd. mild irregulatities of RCA.    [name] CRF (chronic renal failure)   . ED (erectile dysfunction)   . H/O exercise stress test 05/2012   no statistically significant ischemia.   [name] History of TIA (transient ischemic attack)    transient confusion  . HOH (hard of hearing)    hearing aids  . Hyperlipidemia LDL goal < 70   . Hypertension   . Insomnia   . Macular degeneration    bilateral  . Macular degeneration    Right>left  . OSA (obstructive sleep apnea)    mild, never on cpap  . Peripheral neuropathy   . Vitamin D deficiency     Past Surgical History:  Procedure Laterality Date  . ANKLE SURGERY     right ankle 2002 fracture  . BACK SURGERY  1976   laminectomy  . CARDIAC CATHETERIZATION     2005, stent placed DES LAD  . CATARACT EXTRACTION     bilateral  . CORONARY ANGIOPLASTY    . HERNIA REPAIR  1981   "double hernia"  . KNEE SURGERY     right knee 2011   . LEFT HEART CATHETERIZATION WITH CORONARY ANGIOGRAM N/A 04/16/2013   Procedure: LEFT HEART CATHETERIZATION WITH CORONARY ANGIOGRAM;  Surgeon: Leonie Man, MD;  Location: [facility] CATH LAB;  Service: Cardiovascular;  Laterality: N/A;  . NM MYOCAR MULTIPLE W/SPECT  05/13/2012   MILD UPPER SEPTAL THINNING AND DIAPHRAGMATIC ATTENUATION W/O SIGNIFICANT ISCHEMIA. EF 66%.  . TRANSTHORACIC ECHOCARDIOGRAM  05/12/2010   EF =>55%. MILD CONCENTRIC LVH. TRACE MITRAL REGURG.    There were no vitals filed for this visit.      Subjective Assessment - 11/09/16 1104    Subjective I saw the doctor yesterday and got a cortisone shot in his arm. This he reports helped. He reports today he does not feel like himself, is very fatigued. PTA asked him if he plans to discuss these complaints with his dotor ( fatigue, general malaise), he reports he has not yet.  No device, impulsivegetting out of chair.    Currently in Pain? No/denies  Just don't feel good... nondescript   Multiple Pain Sites No                         [facility] Adult PT Treatment/Exercise - 11/09/16 0001      Self-Care   Self-Care Other Self-Care Comments   Other Self-Care Comments  Practiced sit to stand  and taking more time to stand all the way erect before  walking. His tendancy is to stand half way up and take off...stumblling 2x but not falling.      Lumbar Exercises: Standing   Heel Raises 10 reps     Knee/Hip Exercises: Aerobic   Nustep L1 x 10 11 min     Knee/Hip Exercises: Standing   Knee Flexion AROM;Strengthening;Both;1 set;10 reps   Hip ADduction AROM;Strengthening;Both;1 set;10 reps   SLS Bil 2 x 10 sec with fingers on the bar. VC to hold the stance   Other Standing Knee Exercises step taps 20x without UE support      Knee/Hip Exercises: Seated   Ball Squeeze 20x   Clamshell with TheraBand Red  10x   Sit to Sand --  Seated: ankle DF/Toe taps 3x 10                  PT Short Term Goals - 11/07/16 1120      PT SHORT TERM GOAL #1   Title Pt will be independent in initial HEP   Time 3   Period Weeks   Status Achieved     PT SHORT TERM GOAL  #2   Title Pt will perform 10 STS without UEs without LOB in between reps to assist with safer transfers at home   Time 3   Period Weeks   Status Achieved     PT SHORT TERM GOAL #3   Title Pt will demo improved Berg to 30/56 for decreased falls risk   Baseline 36/56 (11/07/16)   Time 3   Period Weeks   Status Achieved           PT Long Term Goals - 11/07/16 1116      PT LONG TERM GOAL #1   Title Pt will demo improved Berg to >/= 45/56 for decreased falls risk   Baseline 36/56 (11/07/16)   Time 6   Period Weeks   Status On-going     PT LONG TERM GOAL #2   Title Pt will report not needing to assist R LE into car   Time 6   Period Weeks   Status Achieved     PT LONG TERM GOAL #3   Title Pt will demo improved bil LE strength >/= 1 MMT grade to assist with more stability with gait   Time 6   Period Weeks   Status On-going     PT LONG TERM GOAL #4   Title Pt will demo improved stability with ambulation with RW or least restrictive assistive device level/unlevel surfaces to assist with out of home outings   Time 6   Period Weeks   Status On-going               Plan - 11/09/16 1103    Clinical Impression Statement Pt presents today with typical posture and gait, no device. Reports he feels off, fatigued, low energy.  He often repeated information just given to PTA . He also reports that he went to the driving range with his wife yesterday.  He  reports  he hit balls maybe 10 min, no falls, but  very tired from it. He reports his balance was not great walking to the the tee box.  greatest safety concern today was his impulsive nature upon standing to take off before his trunk was over his pelvis, causing 2 episodes of stumbling forward but not falling.  We repeated standing with a 5 sec pause many times to help pt learn and repeat more automatically.  Rehab Potential Good   PT Frequency 2x / week   PT Duration 6 weeks   PT Treatment/Interventions ADLs/Self Care Home  Management;Cryotherapy;Functional mobility training;Stair training;Gait training;Therapeutic activities;Therapeutic exercise;Balance training;Neuromuscular re-education;Patient/family education;Passive range of motion;Manual techniques;Energy conservation   PT Next Visit Plan Pt reports he only wants to come 2x more next week. Plan to have ERO on Wednesday.    Consulted and Agree with Plan of Care Patient      Patient will benefit from skilled therapeutic intervention in order to improve the following deficits and impairments:  Abnormal gait, Decreased activity tolerance, Decreased balance, Decreased cognition, Decreased endurance, Decreased mobility, Decreased safety awareness, Decreased strength, Difficulty walking  Visit Diagnosis: Weakness generalized  Unsteadiness on feet  Difficulty in walking, not elsewhere classified  Chronic bilateral low back pain without sciatica     Problem List Patient Active Problem List   Diagnosis Date Noted  . Influenza A (H1N1) 08/07/2014  . Abnormality of gait 11/05/2013  . OSA (obstructive sleep apnea) 07/23/2013  . Erectile dysfunction 07/23/2013  . NSTEMI (non-ST elevated myocardial infarction) ([place]) 04/16/2013  . Anginal chest pain at rest [facility]) 04/15/2013  . CAD (coronary artery disease), Hx of LAD DES in 2005 04/15/2013  . Peripheral neuropathy 04/15/2013  . Hyperlipidemia with target LDL less than 70 04/15/2013    Shakeera Rightmyer, PTA 11/09/2016, 11:51 AM  [facility] [address], [place], [place], [address] Phone: [phone]   Fax:  [phone]  Name: Corey Larson. MRN: [id number] Date of Birth: Sep 13, 1927

## 2016-11-12 ENCOUNTER — Encounter: Payer: Medicare Other | Admitting: Physical Therapy

## 2016-11-14 ENCOUNTER — Encounter: Payer: Self-pay | Admitting: Physical Therapy

## 2016-11-14 ENCOUNTER — Ambulatory Visit: Payer: Medicare Other | Admitting: Physical Therapy

## 2016-11-14 DIAGNOSIS — R2681 Unsteadiness on feet: Secondary | ICD-10-CM

## 2016-11-14 DIAGNOSIS — R531 Weakness: Secondary | ICD-10-CM | POA: Diagnosis not present

## 2016-11-14 DIAGNOSIS — M545 Low back pain: Secondary | ICD-10-CM

## 2016-11-14 DIAGNOSIS — G8929 Other chronic pain: Secondary | ICD-10-CM | POA: Diagnosis not present

## 2016-11-14 DIAGNOSIS — R262 Difficulty in walking, not elsewhere classified: Secondary | ICD-10-CM

## 2016-11-14 NOTE — Therapy (Signed)
[facility] [address] [place], [place], [address] Phone: [phone]   Fax:  [phone]  Physical Therapy Treatment  Patient Details  Name: Corey Larson. MRN: [id number] Date of Birth: Feb 17, 1928 Referring Provider: Ashby Dawes  Encounter Date: 11/14/2016      PT End of Session - 11/14/16 1107    Visit Number 20   Number of Visits 28   Date for PT Re-Evaluation 11/23/16   Authorization Type Medicare G codes:  KX at visit 14   PT Start Time 1101   PT Stop Time 1142   PT Time Calculation (min) 41 min   Activity Tolerance Patient limited by lethargy;Patient limited by fatigue   Behavior During Therapy [facility] for tasks assessed/performed;Impulsive      Past Medical History:  Diagnosis Date  . Abnormality of gait 11/05/2013  . CAD (coronary artery disease) 2005   stent to LAD by Dr. Glade Lloyd. mild irregulatities of RCA.    [name] CRF (chronic renal failure)   . ED (erectile dysfunction)   . H/O exercise stress test 05/2012   no statistically significant ischemia.   [name] History of TIA (transient ischemic attack)    transient confusion  . HOH (hard of hearing)    hearing aids  . Hyperlipidemia LDL goal < 70   . Hypertension   . Insomnia   . Macular degeneration    bilateral  . Macular degeneration    Right>left  . OSA (obstructive sleep apnea)    mild, never on cpap  . Peripheral neuropathy   . Vitamin D deficiency     Past Surgical History:  Procedure Laterality Date  . ANKLE SURGERY     right ankle 2002 fracture  . BACK SURGERY  1976   laminectomy  . CARDIAC CATHETERIZATION     2005, stent placed DES LAD  . CATARACT EXTRACTION     bilateral  . CORONARY ANGIOPLASTY    . HERNIA REPAIR  1981   "double hernia"  . KNEE SURGERY     right knee 2011   . LEFT HEART CATHETERIZATION WITH CORONARY ANGIOGRAM N/A 04/16/2013   Procedure: LEFT HEART CATHETERIZATION WITH CORONARY ANGIOGRAM;  Surgeon: Leonie Man, MD;  Location: [facility] CATH LAB;  Service: Cardiovascular;  Laterality: N/A;  . NM MYOCAR MULTIPLE W/SPECT  05/13/2012   MILD UPPER SEPTAL THINNING AND DIAPHRAGMATIC ATTENUATION W/O SIGNIFICANT ISCHEMIA. EF 66%.  . TRANSTHORACIC ECHOCARDIOGRAM  05/12/2010   EF =>55%. MILD CONCENTRIC LVH. TRACE MITRAL REGURG.    There were no vitals filed for this visit.      Subjective Assessment - 11/14/16 1105    Subjective I am feeling okay.  I am feeling normal today.   Limitations Standing;Walking   How long can you sit comfortably? > 1 hour   How long can you stand comfortably? < 5 minutes   How long can you walk comfortably? < 5 minutes   Diagnostic tests none   Patient Stated Goals "to not fall anymore."   Currently in Pain? No/denies                         [facility] Adult PT Treatment/Exercise - 11/14/16 0001      Transfers   Five time sit to stand comments  33 sec   Stand to Sit 7: Independent     Knee/Hip Exercises: Aerobic   Nustep L3 x 10 min     Knee/Hip Exercises: Standing  Knee Flexion AROM;Strengthening;Both;1 set;10 reps   Hip ADduction AROM;Strengthening;Both;1 set;10 reps   Hip Extension AROM;Right;Left;10 reps   SLS tandem standing without UE support   Other Standing Knee Exercises step taps 20x without UE support      Knee/Hip Exercises: Seated   Long Arc Quad Strengthening;Right;Left;20 reps   Ball Squeeze 20x   Clamshell with TheraBand Red  10x   Sit to [employer] --  Seated: ankle DF/Toe taps 3x 10                  PT Short Term Goals - 11/07/16 1120      PT SHORT TERM GOAL #1   Title Pt will be independent in initial HEP   Time 3   Period Weeks   Status Achieved     PT SHORT TERM GOAL #2   Title Pt will perform 10 STS without UEs without LOB in between reps to assist with safer transfers at home   Time 3   Period Weeks   Status Achieved     PT SHORT TERM GOAL #3   Title Pt will demo improved Berg to 30/56 for decreased  falls risk   Baseline 36/56 (11/07/16)   Time 3   Period Weeks   Status Achieved           PT Long Term Goals - 11/14/16 1116      PT LONG TERM GOAL #1   Title Pt will demo improved Berg to >/= 45/56 for decreased falls risk   Baseline 36/56 (11/07/16)   Time 6   Period Weeks   Status On-going     PT LONG TERM GOAL #2   Title Pt will report not needing to assist R LE into car   Time 6   Period Weeks   Status Achieved     PT LONG TERM GOAL #3   Title Pt will demo improved bil LE strength >/= 1 MMT grade to assist with more stability with gait   Time 6   Period Weeks   Status Achieved     PT LONG TERM GOAL #4   Title Pt will demo improved stability with ambulation with RW or least restrictive assistive device level/unlevel surfaces to assist with out of home outings   Time 6   Period Weeks   Status Achieved               Plan - 11/14/16 1107    Clinical Impression Statement Patient reports he is feeling better overall and is able to perform exercises at home.  Pt has improved balance but is still at risk for falls and did not meet his long term balance goal. Pt will need skilled PT in order to safely transition to home with HEP.    PT Treatment/Interventions ADLs/Self Care Home Management;Cryotherapy;Functional mobility training;Stair training;Gait training;Therapeutic activities;Therapeutic exercise;Balance training;Neuromuscular re-education;Patient/family education;Passive range of motion;Manual techniques;Energy conservation   PT Next Visit Plan discharge next viist   Consulted and Agree with Plan of Care Patient      Patient will benefit from skilled therapeutic intervention in order to improve the following deficits and impairments:  Abnormal gait, Decreased activity tolerance, Decreased balance, Decreased cognition, Decreased endurance, Decreased mobility, Decreased safety awareness, Decreased strength, Difficulty walking  Visit Diagnosis: Weakness  generalized  Unsteadiness on feet  Difficulty in walking, not elsewhere classified  Chronic bilateral low back pain without sciatica     Problem List Patient Active Problem List   Diagnosis Date Noted  .  Influenza A (H1N1) 08/07/2014  . Abnormality of gait 11/05/2013  . OSA (obstructive sleep apnea) 07/23/2013  . Erectile dysfunction 07/23/2013  . NSTEMI (non-ST elevated myocardial infarction) ([place]) 04/16/2013  . Anginal chest pain at rest [facility]) 04/15/2013  . CAD (coronary artery disease), Hx of LAD DES in 2005 04/15/2013  . Peripheral neuropathy 04/15/2013  . Hyperlipidemia with target LDL less than 70 04/15/2013    Zannie Cove, PT 11/14/2016, 11:41 AM  [facility] [address] [place], [place], [address] Phone: [phone]   Fax:  [phone]  Name: Corey Larson. MRN: [id number] Date of Birth: 30-Aug-1927

## 2016-11-19 ENCOUNTER — Ambulatory Visit: Payer: Medicare Other | Admitting: Physical Therapy

## 2016-11-19 DIAGNOSIS — M545 Low back pain, unspecified: Secondary | ICD-10-CM

## 2016-11-19 DIAGNOSIS — R2681 Unsteadiness on feet: Secondary | ICD-10-CM

## 2016-11-19 DIAGNOSIS — G8929 Other chronic pain: Secondary | ICD-10-CM

## 2016-11-19 DIAGNOSIS — R531 Weakness: Secondary | ICD-10-CM

## 2016-11-19 DIAGNOSIS — R262 Difficulty in walking, not elsewhere classified: Secondary | ICD-10-CM

## 2016-11-19 NOTE — Therapy (Signed)
[facility] [address] [place], [place], [address] Phone: [phone]   Fax:  [phone]  Physical Therapy Treatment  Patient Details  Name: Corey Larson. MRN: [id number] Date of Birth: Nov 11, 1927 Referring Provider: Nicholos Johns  Encounter Date: 11/19/2016      PT End of Session - 11/19/16 1252    Visit Number 21   Number of Visits 28   Date for PT Re-Evaluation 11/23/16   Authorization Type Medicare G codes:  KX at visit 14   PT Start Time 1230   PT Stop Time 1300   PT Time Calculation (min) 30 min   Activity Tolerance Patient tolerated treatment well   Behavior During Therapy [facility] for tasks assessed/performed      Past Medical History:  Diagnosis Date  . Abnormality of gait 11/05/2013  . CAD (coronary artery disease) 2005   stent to LAD by Dr. Aleen Campi. mild irregulatities of RCA.    [name] CRF (chronic renal failure)   . ED (erectile dysfunction)   . H/O exercise stress test 05/2012   no statistically significant ischemia.   [name] History of TIA (transient ischemic attack)    transient confusion  . HOH (hard of hearing)    hearing aids  . Hyperlipidemia LDL goal < 70   . Hypertension   . Insomnia   . Macular degeneration    bilateral  . Macular degeneration    Right>left  . OSA (obstructive sleep apnea)    mild, never on cpap  . Peripheral neuropathy   . Vitamin D deficiency     Past Surgical History:  Procedure Laterality Date  . ANKLE SURGERY     right ankle 2002 fracture  . BACK SURGERY  1976   laminectomy  . CARDIAC CATHETERIZATION     2005, stent placed DES LAD  . CATARACT EXTRACTION     bilateral  . CORONARY ANGIOPLASTY    . HERNIA REPAIR  1981   "double hernia"  . KNEE SURGERY     right knee 2011   . LEFT HEART CATHETERIZATION WITH CORONARY ANGIOGRAM N/A 04/16/2013   Procedure: LEFT HEART CATHETERIZATION WITH CORONARY ANGIOGRAM;  Surgeon: Marykay Lex, MD;  Location: [facility]  CATH LAB;  Service: Cardiovascular;  Laterality: N/A;  . NM MYOCAR MULTIPLE W/SPECT  05/13/2012   MILD UPPER SEPTAL THINNING AND DIAPHRAGMATIC ATTENUATION W/O SIGNIFICANT ISCHEMIA. EF 66%.  . TRANSTHORACIC ECHOCARDIOGRAM  05/12/2010   EF =>55%. MILD CONCENTRIC LVH. TRACE MITRAL REGURG.    There were no vitals filed for this visit.      Subjective Assessment - 11/19/16 1302    Subjective I don't think I have any questions about my exercises.  I have all the handouts and I have been doing some at home.   Pertinent History Pt/friend report pt has had 2 falls in 2018 and balance is becoming more of an issue. Pt reluctant to use a RW. He has fallen at church due to decreased foot clearance and got up and fell in the middle of the night. Both parties report pt had a sudden decline. ? TIA. They are to consult with MD regarding due to suddenness and of physical and cognitive decline.   Currently in Pain? No/denies                         [facility] Adult PT Treatment/Exercise - 11/19/16 0001      Lumbar Exercises: Standing   Heel  Raises 10 reps     Knee/Hip Exercises: Aerobic   Nustep L3 x 10 min     Knee/Hip Exercises: Standing   Knee Flexion AROM;Strengthening;Both;1 set;10 reps   Hip ADduction AROM;Strengthening;Both;1 set;10 reps   Hip Extension AROM;Right;Left;10 reps   SLS tandem standing without UE support   Other Standing Knee Exercises step taps 20x without UE support      Knee/Hip Exercises: Seated   Long Arc Quad Strengthening;Right;Left;20 reps   Ball Squeeze 20x   Clamshell with TheraBand Red  10x   Sit to [employer] 10 reps  Seated: ankle DF/Toe taps 3x 10                  PT Short Term Goals - 11/07/16 1120      PT SHORT TERM GOAL #1   Title Pt will be independent in initial HEP   Time 3   Period Weeks   Status Achieved     PT SHORT TERM GOAL #2   Title Pt will perform 10 STS without UEs without LOB in between reps to assist with safer  transfers at home   Time 3   Period Weeks   Status Achieved     PT SHORT TERM GOAL #3   Title Pt will demo improved Berg to 30/56 for decreased falls risk   Baseline 36/56 (11/07/16)   Time 3   Period Weeks   Status Achieved           PT Long Term Goals - 11/14/16 1116      PT LONG TERM GOAL #1   Title Pt will demo improved Berg to >/= 45/56 for decreased falls risk   Baseline 36/56 (11/07/16)   Time 6   Period Weeks   Status Not met     PT LONG TERM GOAL #2   Title Pt will report not needing to assist R LE into car   Time 6   Period Weeks   Status Achieved     PT LONG TERM GOAL #3   Title Pt will demo improved bil LE strength >/= 1 MMT grade to assist with more stability with gait   Time 6   Period Weeks   Status Achieved     PT LONG TERM GOAL #4   Title Pt will demo improved stability with ambulation with RW or least restrictive assistive device level/unlevel surfaces to assist with out of home outings   Time 6   Period Weeks   Status Achieved               Plan - 12/19/2016 1238    Clinical Impression Statement Patient is independent with HEP and will discharge from PT today.   PT Treatment/Interventions ADLs/Self Care Home Management;Cryotherapy;Functional mobility training;Stair training;Gait training;Therapeutic activities;Therapeutic exercise;Balance training;Neuromuscular re-education;Patient/family education;Passive range of motion;Manual techniques;Energy conservation   PT Next Visit Plan discharged today   Consulted and Agree with Plan of Care Patient      Patient will benefit from skilled therapeutic intervention in order to improve the following deficits and impairments:  Abnormal gait, Decreased activity tolerance, Decreased balance, Decreased cognition, Decreased endurance, Decreased mobility, Decreased safety awareness, Decreased strength, Difficulty walking  Visit Diagnosis: No diagnosis found.       G-Codes - 2016/12/19 1302     Functional Assessment Tool Used (Outpatient Only) 5x sit to stand and Berg   Functional Limitation Mobility: Walking and moving around   Mobility: Walking and Moving Around Current Status [phone]) At least  60 percent but less than 80 percent impaired, limited or restricted   Mobility: Walking and Moving Around Goal Status [phone]) At least 40 percent but less than 60 percent impaired, limited or restricted   Mobility: Walking and Moving Around Discharge Status [phone]) At least 60 percent but less than 80 percent impaired, limited or restricted      Problem List Patient Active Problem List   Diagnosis Date Noted  . Influenza A (H1N1) 08/07/2014  . Abnormality of gait 11/05/2013  . OSA (obstructive sleep apnea) 07/23/2013  . Erectile dysfunction 07/23/2013  . NSTEMI (non-ST elevated myocardial infarction) ([place]) 04/16/2013  . Anginal chest pain at rest [facility]) 04/15/2013  . CAD (coronary artery disease), Hx of LAD DES in 2005 04/15/2013  . Peripheral neuropathy 04/15/2013  . Hyperlipidemia with target LDL less than 70 04/15/2013    Zannie Cove, PT 11/19/2016, 1:04 PM  [facility] [address] [place], [place], [address] Phone: [phone]   Fax:  [phone]  Name: Corey Larson. MRN: [id number] Date of Birth: 1928/01/07  PHYSICAL THERAPY DISCHARGE SUMMARY  Visits from Start of Care: 21  Current functional level related to goals / functional outcomes: See above   Remaining deficits: See above   Education / Equipment: HEP  Plan: Patient agrees to discharge.  Patient goals were partially met. Patient is being discharged due to being pleased with the current functional level.  ?????   [employer], PT 11/19/16 1:06 PM

## 2016-11-28 ENCOUNTER — Ambulatory Visit: Payer: Medicare Other | Admitting: Podiatry

## 2016-12-10 ENCOUNTER — Ambulatory Visit (INDEPENDENT_AMBULATORY_CARE_PROVIDER_SITE_OTHER): Payer: Medicare Other | Admitting: Podiatry

## 2016-12-10 DIAGNOSIS — Q828 Other specified congenital malformations of skin: Secondary | ICD-10-CM

## 2016-12-20 DIAGNOSIS — H353221 Exudative age-related macular degeneration, left eye, with active choroidal neovascularization: Secondary | ICD-10-CM | POA: Diagnosis not present

## 2016-12-25 DIAGNOSIS — I2510 Atherosclerotic heart disease of native coronary artery without angina pectoris: Secondary | ICD-10-CM | POA: Diagnosis not present

## 2016-12-25 DIAGNOSIS — R5383 Other fatigue: Secondary | ICD-10-CM | POA: Diagnosis not present

## 2016-12-25 DIAGNOSIS — E785 Hyperlipidemia, unspecified: Secondary | ICD-10-CM | POA: Diagnosis not present

## 2016-12-25 DIAGNOSIS — I129 Hypertensive chronic kidney disease with stage 1 through stage 4 chronic kidney disease, or unspecified chronic kidney disease: Secondary | ICD-10-CM | POA: Diagnosis not present

## 2016-12-25 DIAGNOSIS — I10 Essential (primary) hypertension: Secondary | ICD-10-CM | POA: Diagnosis not present

## 2016-12-25 DIAGNOSIS — Z0000 Encounter for general adult medical examination without abnormal findings: Secondary | ICD-10-CM | POA: Diagnosis not present

## 2016-12-28 NOTE — Progress Notes (Signed)
   Subjective: Patient presents to the office today for chief complaint of painful callus lesions of the feet. Patient states that the pain is ongoing and is affecting their ability to ambulate without pain. Patient presents today for further treatment and evaluation.  Objective:  Physical Exam General: Alert and oriented x3 in no acute distress  Dermatology: Hyperkeratotic lesion present on the plantar aspect of the fifth MPJ right foot. Pain on palpation with a central nucleated core noted.  Skin is warm, dry and supple bilateral lower extremities. Negative for open lesions or macerations.  Vascular: Palpable pedal pulses bilaterally. No edema or erythema noted. Capillary refill within normal limits.  Neurological: Epicritic and protective threshold grossly intact bilaterally.   Musculoskeletal Exam: Pain on palpation at the keratotic lesion noted. Range of motion within normal limits bilateral. Muscle strength 5/5 in all groups bilateral.  Assessment: #1 porokeratosis sub-fifth MPJ right foot #2 history of ORIF right ankle   Plan of Care:  #1 Patient evaluated #2 Excisional debridement of keratoic lesion using a chisel blade was performed without incident.  #3 Treated area(s) with Salinocaine and dressed with light dressing. #4 Patient is to return to the clinic PRN.   Edrick Kins, DPM [facility]  Dr. Edrick Kins, [address]                                        [place], [place] [address]                Office [phone]  Fax [phone]

## 2016-12-31 ENCOUNTER — Other Ambulatory Visit: Payer: Self-pay | Admitting: Internal Medicine

## 2016-12-31 DIAGNOSIS — R634 Abnormal weight loss: Secondary | ICD-10-CM

## 2017-01-07 DIAGNOSIS — I2510 Atherosclerotic heart disease of native coronary artery without angina pectoris: Secondary | ICD-10-CM | POA: Diagnosis not present

## 2017-01-07 DIAGNOSIS — I129 Hypertensive chronic kidney disease with stage 1 through stage 4 chronic kidney disease, or unspecified chronic kidney disease: Secondary | ICD-10-CM | POA: Diagnosis not present

## 2017-01-07 DIAGNOSIS — I209 Angina pectoris, unspecified: Secondary | ICD-10-CM | POA: Diagnosis not present

## 2017-01-07 DIAGNOSIS — N183 Chronic kidney disease, stage 3 (moderate): Secondary | ICD-10-CM | POA: Diagnosis not present

## 2017-01-08 ENCOUNTER — Other Ambulatory Visit: Payer: PRIVATE HEALTH INSURANCE

## 2017-01-08 ENCOUNTER — Inpatient Hospital Stay (HOSPITAL_COMMUNITY)
Admission: EM | Admit: 2017-01-08 | Discharge: 2017-01-11 | DRG: 176 | Disposition: A | Discharge status: Home Health | Payer: Medicare Other | Attending: Internal Medicine | Admitting: Internal Medicine

## 2017-01-08 ENCOUNTER — Ambulatory Visit
Admission: RE | Admit: 2017-01-08 | Discharge: 2017-01-08 | Disposition: A | Discharge status: Home / Self Care | Payer: Medicare Other | Source: Ambulatory Visit | Attending: Internal Medicine | Admitting: Internal Medicine

## 2017-01-08 ENCOUNTER — Encounter (HOSPITAL_COMMUNITY): Payer: Self-pay | Admitting: *Deleted

## 2017-01-08 DIAGNOSIS — I82411 Acute embolism and thrombosis of right femoral vein: Secondary | ICD-10-CM | POA: Diagnosis not present

## 2017-01-08 DIAGNOSIS — F0391 Unspecified dementia with behavioral disturbance: Secondary | ICD-10-CM | POA: Diagnosis present

## 2017-01-08 DIAGNOSIS — Z6824 Body mass index (BMI) 24.0-24.9, adult: Secondary | ICD-10-CM

## 2017-01-08 DIAGNOSIS — Z8249 Family history of ischemic heart disease and other diseases of the circulatory system: Secondary | ICD-10-CM

## 2017-01-08 DIAGNOSIS — I82441 Acute embolism and thrombosis of right tibial vein: Secondary | ICD-10-CM | POA: Diagnosis not present

## 2017-01-08 DIAGNOSIS — Z833 Family history of diabetes mellitus: Secondary | ICD-10-CM

## 2017-01-08 DIAGNOSIS — I252 Old myocardial infarction: Secondary | ICD-10-CM

## 2017-01-08 DIAGNOSIS — E785 Hyperlipidemia, unspecified: Secondary | ICD-10-CM | POA: Diagnosis not present

## 2017-01-08 DIAGNOSIS — G6289 Other specified polyneuropathies: Secondary | ICD-10-CM

## 2017-01-08 DIAGNOSIS — K5730 Diverticulosis of large intestine without perforation or abscess without bleeding: Secondary | ICD-10-CM | POA: Diagnosis not present

## 2017-01-08 DIAGNOSIS — R296 Repeated falls: Secondary | ICD-10-CM | POA: Diagnosis present

## 2017-01-08 DIAGNOSIS — I82431 Acute embolism and thrombosis of right popliteal vein: Secondary | ICD-10-CM | POA: Diagnosis present

## 2017-01-08 DIAGNOSIS — M542 Cervicalgia: Secondary | ICD-10-CM

## 2017-01-08 DIAGNOSIS — R634 Abnormal weight loss: Secondary | ICD-10-CM

## 2017-01-08 DIAGNOSIS — Z87891 Personal history of nicotine dependence: Secondary | ICD-10-CM

## 2017-01-08 DIAGNOSIS — I2510 Atherosclerotic heart disease of native coronary artery without angina pectoris: Secondary | ICD-10-CM | POA: Diagnosis present

## 2017-01-08 DIAGNOSIS — I2699 Other pulmonary embolism without acute cor pulmonale: Secondary | ICD-10-CM | POA: Diagnosis not present

## 2017-01-08 DIAGNOSIS — N189 Chronic kidney disease, unspecified: Secondary | ICD-10-CM | POA: Diagnosis present

## 2017-01-08 DIAGNOSIS — I824Y9 Acute embolism and thrombosis of unspecified deep veins of unspecified proximal lower extremity: Secondary | ICD-10-CM

## 2017-01-08 DIAGNOSIS — Z955 Presence of coronary angioplasty implant and graft: Secondary | ICD-10-CM

## 2017-01-08 DIAGNOSIS — R9431 Abnormal electrocardiogram [ECG] [EKG]: Secondary | ICD-10-CM | POA: Diagnosis not present

## 2017-01-08 DIAGNOSIS — G629 Polyneuropathy, unspecified: Secondary | ICD-10-CM

## 2017-01-08 DIAGNOSIS — H3530 Unspecified macular degeneration: Secondary | ICD-10-CM | POA: Diagnosis present

## 2017-01-08 DIAGNOSIS — K8020 Calculus of gallbladder without cholecystitis without obstruction: Secondary | ICD-10-CM | POA: Diagnosis not present

## 2017-01-08 DIAGNOSIS — I129 Hypertensive chronic kidney disease with stage 1 through stage 4 chronic kidney disease, or unspecified chronic kidney disease: Secondary | ICD-10-CM | POA: Diagnosis present

## 2017-01-08 HISTORY — DX: Non-ST elevation (NSTEMI) myocardial infarction: I21.4

## 2017-01-08 LAB — COMPREHENSIVE METABOLIC PANEL
ALBUMIN: 4.3 g/dL (ref 3.5–5.0)
ALK PHOS: 67 U/L (ref 38–126)
ALT: 14 U/L — AB (ref 17–63)
ANION GAP: 12 (ref 5–15)
AST: 24 U/L (ref 15–41)
BUN: 11 mg/dL (ref 6–20)
CALCIUM: 9.4 mg/dL (ref 8.9–10.3)
CO2: 24 mmol/L (ref 22–32)
CREATININE: 1.13 mg/dL (ref 0.61–1.24)
Chloride: 104 mmol/L (ref 101–111)
GFR calc Af Amer: >60 mL/min (ref >60)
GFR calc non Af Amer: 57 mL/min — ABNORMAL LOW (ref >60)
GLUCOSE: 116 mg/dL — AB (ref 65–99)
Potassium: 4 mmol/L (ref 3.5–5.1)
SODIUM: 140 mmol/L (ref 135–145)
Total Bilirubin: 1.5 mg/dL — ABNORMAL HIGH (ref 0.3–1.2)
Total Protein: 8.4 g/dL — ABNORMAL HIGH (ref 6.5–8.1)

## 2017-01-08 LAB — CBC WITH DIFFERENTIAL/PLATELET
BASOS ABS: 0 10*3/uL (ref 0.0–0.1)
BASOS PCT: 0 %
EOS ABS: 0 10*3/uL (ref 0.0–0.7)
Eosinophils Relative: 1 %
HEMATOCRIT: 43.7 % (ref 39.0–52.0)
HEMOGLOBIN: 14.7 g/dL (ref 13.0–17.0)
Lymphocytes Relative: 21 %
Lymphs Abs: 1.8 10*3/uL (ref 0.7–4.0)
MCH: 27.7 pg (ref 26.0–34.0)
MCHC: 33.6 g/dL (ref 30.0–36.0)
MCV: 82.5 fL (ref 78.0–100.0)
Monocytes Absolute: 1.2 10*3/uL — ABNORMAL HIGH (ref 0.1–1.0)
Monocytes Relative: 13 %
NEUTROS ABS: 5.6 10*3/uL (ref 1.7–7.7)
NEUTROS PCT: 65 %
Platelets: 160 10*3/uL (ref 150–400)
RBC: 5.3 MIL/uL (ref 4.22–5.81)
RDW: 17.7 % — ABNORMAL HIGH (ref 11.5–15.5)
WBC: 8.7 10*3/uL (ref 4.0–10.5)

## 2017-01-08 LAB — I-STAT CHEM 8, ED
BUN: 10 mg/dL (ref 6–20)
CHLORIDE: 104 mmol/L (ref 101–111)
Calcium, Ion: 1.14 mmol/L — ABNORMAL LOW (ref 1.15–1.40)
Creatinine, Ser: 1 mg/dL (ref 0.61–1.24)
GLUCOSE: 111 mg/dL — AB (ref 65–99)
HCT: 46 % (ref 39.0–52.0)
Hemoglobin: 15.6 g/dL (ref 13.0–17.0)
POTASSIUM: 3.8 mmol/L (ref 3.5–5.1)
Sodium: 141 mmol/L (ref 135–145)
TCO2: 23 mmol/L (ref 0–100)

## 2017-01-08 LAB — I-STAT TROPONIN, ED: Troponin i, poc: 0 ng/mL (ref 0.00–0.08)

## 2017-01-08 LAB — PROTIME-INR
INR: 1.13
Prothrombin Time: 14.5 seconds (ref 11.4–15.2)

## 2017-01-08 LAB — APTT: APTT: 37 s — AB (ref 24–36)

## 2017-01-08 LAB — BRAIN NATRIURETIC PEPTIDE: B NATRIURETIC PEPTIDE 5: 118.4 pg/mL — AB (ref 0.0–100.0)

## 2017-01-08 MED ORDER — B COMPLEX-C PO TABS
1.0000 | ORAL_TABLET | Freq: Every day | ORAL | Status: DC
  Administered 2017-01-08 – 2017-01-11 (×3): 1 via ORAL
  Filled 2017-01-08 (×4): qty 1

## 2017-01-08 MED ORDER — CALCIUM CARBONATE ANTACID 500 MG PO CHEW: 1.0000 | CHEWABLE_TABLET | ORAL | Status: DC | PRN

## 2017-01-08 MED ORDER — IOPAMIDOL (ISOVUE-300) INJECTION 61%
100.0000 mL | Freq: Once | INTRAVENOUS | Status: AC | PRN
Start: 2017-01-08 — End: 2017-01-08
  Administered 2017-01-08: 100 mL via INTRAVENOUS

## 2017-01-08 MED ORDER — HYDRALAZINE HCL 20 MG/ML IJ SOLN
10.0000 mg | INTRAMUSCULAR | Status: DC | PRN
  Filled 2017-01-08: qty 0.5

## 2017-01-08 MED ORDER — HEPARIN BOLUS VIA INFUSION
3500.0000 [IU] | Freq: Once | INTRAVENOUS | Status: AC
  Administered 2017-01-08: 3500 [IU] via INTRAVENOUS
  Filled 2017-01-08: qty 3500

## 2017-01-08 MED ORDER — ONDANSETRON HCL 4 MG PO TABS: 4.0000 mg | ORAL_TABLET | Freq: Four times a day (QID) | ORAL | Status: DC | PRN

## 2017-01-08 MED ORDER — NEBIVOLOL HCL 5 MG PO TABS
5.0000 mg | ORAL_TABLET | Freq: Every day | ORAL | Status: DC
  Administered 2017-01-08 – 2017-01-10 (×3): 5 mg via ORAL
  Filled 2017-01-08 (×3): qty 1

## 2017-01-08 MED ORDER — NITROGLYCERIN 0.4 MG SL SUBL: 0.4000 mg | SUBLINGUAL_TABLET | SUBLINGUAL | Status: DC | PRN

## 2017-01-08 MED ORDER — SUVOREXANT 10 MG PO TABS
1.0000 | ORAL_TABLET | Freq: Every day | ORAL | Status: DC | PRN
  Administered 2017-01-09 – 2017-01-10 (×2): 1 via ORAL

## 2017-01-08 MED ORDER — ONDANSETRON HCL 4 MG/2ML IJ SOLN: 4.0000 mg | Freq: Four times a day (QID) | INTRAMUSCULAR | Status: DC | PRN

## 2017-01-08 MED ORDER — HEPARIN (PORCINE) IN NACL 100-0.45 UNIT/ML-% IJ SOLN
1100.0000 [IU]/h | INTRAMUSCULAR | Status: AC
  Administered 2017-01-08 – 2017-01-10 (×2): 1100 [IU]/h via INTRAVENOUS
  Filled 2017-01-08 (×3): qty 250

## 2017-01-08 MED ORDER — PANTOPRAZOLE SODIUM 40 MG PO TBEC
40.0000 mg | DELAYED_RELEASE_TABLET | Freq: Every day | ORAL | Status: DC
  Administered 2017-01-08 – 2017-01-11 (×4): 40 mg via ORAL
  Filled 2017-01-08 (×4): qty 1

## 2017-01-08 MED ORDER — VITAMIN D3 25 MCG (1000 UNIT) PO TABS
1000.0000 [IU] | ORAL_TABLET | Freq: Every day | ORAL | Status: DC
  Administered 2017-01-08 – 2017-01-11 (×3): 1000 [IU] via ORAL
  Filled 2017-01-08 (×3): qty 1

## 2017-01-08 MED ORDER — HYDROCERIN EX CREA
TOPICAL_CREAM | Freq: Two times a day (BID) | CUTANEOUS | Status: DC
  Administered 2017-01-08 – 2017-01-11 (×4): via TOPICAL
  Filled 2017-01-08: qty 113

## 2017-01-08 MED ORDER — BLUE-EMU SUPER STRENGTH EX CREA: 1.0000 "application " | TOPICAL_CREAM | Freq: Two times a day (BID) | CUTANEOUS | Status: DC

## 2017-01-08 MED ORDER — ACETAMINOPHEN 650 MG RE SUPP: 650.0000 mg | Freq: Four times a day (QID) | RECTAL | Status: DC | PRN

## 2017-01-08 MED ORDER — ALBUTEROL SULFATE (2.5 MG/3ML) 0.083% IN NEBU: 2.5000 mg | INHALATION_SOLUTION | Freq: Four times a day (QID) | RESPIRATORY_TRACT | Status: DC | PRN

## 2017-01-08 MED ORDER — ACETAMINOPHEN 325 MG PO TABS
650.0000 mg | ORAL_TABLET | Freq: Four times a day (QID) | ORAL | Status: DC | PRN
  Administered 2017-01-09 – 2017-01-10 (×2): 650 mg via ORAL
  Filled 2017-01-08 (×3): qty 2

## 2017-01-08 NOTE — Progress Notes (Signed)
Patients blood pressure elevated to 167/89. No PRN medications. Dr. Hal Hope notified. Awaiting orders. Will continue to monitor pt.

## 2017-01-08 NOTE — ED Triage Notes (Signed)
Pt sent to ED because CT of chest and abdomen today showed pulmonary emboli. Pt had CT due to weight loss. Pt reports also being SOB.

## 2017-01-08 NOTE — ED Notes (Signed)
Call report to [facility] at [phone] at 75.

## 2017-01-08 NOTE — H&P (Signed)
History and Physical:    Corey Larson.   SLH:[id number] DOB: 04/08/1928 DOA: 01/08/2017  Referring MD/provider: Duffy Bruce, MD PCP: Thressa Sheller, MD   Patient coming from: Home  Chief Complaint: Weight loss, abnormal CT scan.  History of Present Illness:   Corey Larson. is an 83 y.o. male with a PMH of CAD status post DES to the LAD in 2005 who discontinued Plavix many years ago, hyperlipidemia, peripheral neuropathy and hypertension who is being evaluated by his PCP for abnormal weight loss and in the course of his workup, underwent CT scanning of his chest, abdomen and pelvis today which showed bilateral pulmonary emboli. The patient denies significant dyspnea although his wife insists that he has had some shortness of breath. He denies chest pain. He recently traveled to [place] for 6 hours on a plane and this was followed by a 4 hour car ride. He has no prior history of blood clots, and no family history of blood clots.  ED Course:  The patient was placed on IV heparin.  ROS:   Review of Systems  Constitutional: Positive for malaise/fatigue and weight loss. Negative for chills and fever.  HENT: Negative.   Eyes: Negative.   Respiratory: Positive for shortness of breath. Negative for cough, hemoptysis, sputum production and wheezing.   Cardiovascular: Negative for chest pain, palpitations, orthopnea, claudication, leg swelling and PND.  Gastrointestinal: Positive for diarrhea. Negative for abdominal pain, blood in stool, constipation, heartburn, melena, nausea and vomiting.  Genitourinary: Negative.   Musculoskeletal: Positive for falls.  Skin: Negative.   Neurological: Positive for weakness. Negative for dizziness.       Painful lower extremity neuropathy  Endo/Heme/Allergies: Bruises/bleeds easily.       Cold intolerance  Psychiatric/Behavioral: Negative.     Past Medical History:   Past Medical History:  Diagnosis Date  . Abnormality of gait  11/05/2013  . CAD (coronary artery disease) 2005   stent to LAD by Dr. Glade Lloyd. mild irregulatities of RCA.    [name] CRF (chronic renal failure)   . ED (erectile dysfunction)   . H/O exercise stress test 05/2012   no statistically significant ischemia.   [name] History of TIA (transient ischemic attack)    transient confusion  . HOH (hard of hearing)    hearing aids  . Hyperlipidemia LDL goal < 70   . Hypertension   . Insomnia   . Macular degeneration    bilateral  . Macular degeneration    Right>left  . NSTEMI (non-ST elevated myocardial infarction) ([place]) 04/16/2013  . OSA (obstructive sleep apnea)    mild, never on cpap  . Peripheral neuropathy   . Vitamin D deficiency     Past Surgical History:   Past Surgical History:  Procedure Laterality Date  . ANKLE SURGERY     right ankle 2002 fracture  . BACK SURGERY  1976   laminectomy  . CARDIAC CATHETERIZATION     2005, stent placed DES LAD  . CATARACT EXTRACTION     bilateral  . CORONARY ANGIOPLASTY    . HERNIA REPAIR  1981   "double hernia"  . KNEE SURGERY     right knee 2011   . LEFT HEART CATHETERIZATION WITH CORONARY ANGIOGRAM N/A 04/16/2013   Procedure: LEFT HEART CATHETERIZATION WITH CORONARY ANGIOGRAM;  Surgeon: Leonie Man, MD;  Location: [facility] C[facility] LAB;  Service: Cardiovascular;  Laterality: N/A;  . NM MYOCAR MULTIPLE W/SPECT  05/13/2012   MILD UPPER SEPTAL  THINNING AND DIAPHRAGMATIC ATTENUATION W/O SIGNIFICANT ISCHEMIA. EF 66%.  . TRANSTHORACIC ECHOCARDIOGRAM  05/12/2010   EF =>55%. MILD CONCENTRIC LVH. TRACE MITRAL REGURG.    Social History:   Social History   Social History  . Marital status: Married    Spouse name: N/A  . Number of children: 3  . Years of education: N/A   Occupational History  . Retired [employer]    Social History Main Topics  . Smoking status: Former Smoker    Years: 30.00    Types: Cigarettes, Pipe    Quit date: 04/16/1983  . Smokeless tobacco: Never Used  . Alcohol use  Yes     Comment: 1/2 glass per evening  . Drug use: No  . Sexual activity: Not on file   Other Topics Concern  . Not on file   Social History Narrative   Lives with wife. Ambulatory without an assistive device, although his wife urges him to use a walker. Not very active. Quit smoking in 1984.    Allergies   Klonopin [clonazepam] and Ambien [zolpidem]  Family history:   Family History  Problem Relation Age of Onset  . Diabetes Sister   . Alcohol abuse Brother   . Cancer Maternal Grandmother   . Heart disease Paternal Grandfather     Current Medications:   Prior to Admission medications   Medication Sig Start Date End Date Taking? Authorizing Provider  albuterol (PROVENTIL HFA;VENTOLIN HFA) 108 (90 BASE) MCG/ACT inhaler Inhale 2 puffs into the lungs every 6 (six) hours as needed for wheezing or shortness of breath. 08/09/14  Yes Donne Hazel, MD  B Complex-C (B-COMPLEX WITH VITAMIN C) tablet Take 1 tablet by mouth daily.   Yes [provider]  BELSOMRA 10 MG TABS Take 1 tablet by mouth daily as needed.  07/17/16  Yes [provider]  calcium carbonate (TUMS - DOSED IN MG ELEMENTAL CALCIUM) 500 MG chewable tablet Chew 1 tablet by mouth as needed for indigestion or heartburn.   Yes [provider]  Cholecalciferol (VITAMIN D PO) Take 1 tablet by mouth daily.   Yes [provider]  Liniments (BLUE-EMU SUPER STRENGTH) CREA Apply 1 application topically 2 (two) times daily. Use on both feet   Yes [provider]  Multiple Vitamins-Minerals (MULTIVITAMIN ADULT PO) Take 1 tablet by mouth daily as needed. Takes One-A-Day for Men   Yes [provider]  nitroGLYCERIN (NITROSTAT) 0.4 MG SL tablet Place 1 tablet (0.4 mg total) under the tongue every 5 (five) minutes x 3 doses as needed for chest pain. 04/26/14  Yes Troy Sine, MD  omeprazole (PRILOSEC) 20 MG capsule Take 20 mg by mouth daily.   Yes [provider]    ranitidine (ZANTAC) 75 MG tablet Take 75 mg by mouth daily as needed for heartburn.   Yes [provider]  gabapentin (NEURONTIN) 800 MG tablet Take 800 mg by mouth 3 (three) times daily.    [provider]  nebivolol (BYSTOLIC) 10 MG tablet Take 5 mg by mouth at bedtime. Takes 1/2 tablet    [provider]  NONFORMULARY OR COMPOUNDED ITEM [facility]  Peripheral Neuropathy Cream- Bupivacaine 1%, Doxepin 3%, Gabapentin 6%, Pentoxifylline 3%, Topiramate 1% Apply 1-2 grams to affected area 3-4 times daily Qty. 120 gm 3 refills Patient not taking: Reported on 01/08/2017 09/07/16   Edrick Kins, DPM    Physical Exam:   Vitals:   01/08/17 1634 01/08/17 1722  BP: [name])  144/92   Pulse: 100   Resp: 16   Temp: 98.6 F (37 C)   TempSrc: Oral   SpO2: 99%   Weight:  71.7 kg (158 lb 2 oz)  Height:  5\' 7"  (1.702 m)     Physical Exam: Blood pressure (!) 144/92, pulse 100, temperature 98.6 F (37 C), temperature source Oral, resp. rate 16, height 5\' 7"  (1.702 m), weight 71.7 kg (158 lb 2 oz), SpO2 99 %. Gen: No acute distress. Head: Normocephalic, atraumatic. Eyes: Pupils equal, round and reactive to light. Lens implantations bilaterally noted. Extraocular movements intact.  Sclerae nonicteric. No lid lag. Mouth: Oropharynx reveals dry mucous membranes. Dentures to the upper palate. Neck: Supple, no thyromegaly, no lymphadenopathy, no jugular venous distention. Chest: Lungs are clear to auscultation with good air movement. No rales, rhonchi or wheezes.  CV: Heart sounds are regular with an S1, S2. No murmurs, rubs, clicks, or gallops.  Abdomen: Soft, nontender, nondistended with normal active bowel sounds. No hepatosplenomegaly or palpable masses. Extremities: Extremities are without clubbing, or cyanosis. Mild trace edema. Pedal pulses 2+.  Skin: Warm and dry. No rashes, lesions or wounds. Thin skin with some subcutaneous capillary hemorrhages. Neuro: Alert  and oriented times 3; cranial nerves II through XII grossly intact. Moves all extremities 4 with generalized weakness. Psych: Insight is good and judgment is appropriate. Mood and affect depressed/flat/irritable.   Data Review:    Labs: Basic Metabolic Panel:  Recent Labs Lab 01/08/17 1659 01/08/17 1714  NA 140 141  K 4.0 3.8  CL 104 104  CO2 24  --   GLUCOSE 116* 111*  BUN 11 10  CREATININE 1.13 1.00  CALCIUM 9.4  --    Liver Function Tests:  Recent Labs Lab 01/08/17 1659  AST 24  ALT 14*  ALKPHOS 67  BILITOT 1.5*  PROT 8.4*  ALBUMIN 4.3   CBC:  Recent Labs Lab 01/08/17 1659 01/08/17 1714  WBC 8.7  --   NEUTROABS 5.6  --   HGB 14.7 15.6  HCT 43.7 46.0  MCV 82.5  --   PLT 160  --     Urinalysis    Component Value Date/Time   COLORURINE YELLOW 05/02/2015 1125   APPEARANCEUR CLEAR 05/02/2015 1125   LABSPEC 1.029 05/02/2015 1125   PHURINE 6.0 05/02/2015 1125   GLUCOSEU NEGATIVE 05/02/2015 1125   HGBUR NEGATIVE 05/02/2015 1125   BILIRUBINUR NEGATIVE 05/02/2015 1125   KETONESUR NEGATIVE 05/02/2015 1125   PROTEINUR NEGATIVE 05/02/2015 1125   UROBILINOGEN 1.0 08/07/2014 1833   NITRITE NEGATIVE 05/02/2015 1125   LEUKOCYTESUR NEGATIVE 05/02/2015 1125      Radiographic Studies: Ct Chest W Contrast  Result Date: 01/08/2017 CLINICAL DATA:  Weight loss, over 20 pounds in 4 weeks. EXAM: CT CHEST, ABDOMEN, AND PELVIS WITH CONTRAST TECHNIQUE: Multidetector CT imaging of the chest, abdomen and pelvis was performed following the standard protocol during bolus administration of intravenous contrast. CONTRAST:  133mL ISOVUE-300 IOPAMIDOL (ISOVUE-300) INJECTION 61% COMPARISON:  Abdominal CT 05/02/2015 FINDINGS: CT CHEST FINDINGS Cardiovascular: Normal heart size. No pericardial effusion. Bilateral central pulmonary emboli branching into all lobes. These have a central reason appearance. RV to LV ratio is normal, 0.78. Atherosclerosis with bulky calcifications along  the LAD. Mediastinum/Nodes: Moderate sliding hiatal hernia. Negative for adenopathy. Lungs/Pleura: Small pulmonary nodules, those visible on abdominal CT in 2016 are stable and benign. The largest newly seen nodule measures up to 4 mm average diameter. Musculoskeletal: No acute or aggressive finding. CT ABDOMEN PELVIS FINDINGS  Hepatobiliary: Few small low densities in the liver are cystic appearing. No suspicious lesion.Cholelithiasis. No evidence of inflammation or duct obstruction. Pancreas: Unremarkable. Spleen: Unremarkable. Adrenals/Urinary Tract: Negative adrenals. No hydronephrosis or stone. Mild heterogeneous cortical enhancement bilaterally on early phase, which resolves on the later phase. Two simple appearing left renal cysts. Small diverticulum or cellule at the apex of the bladder. Stomach/Bowel: Previously seen cecal wall thickening is not apparent today. Moderate sliding hiatal hernia. No obstruction or inflammatory changes. Sigmoid diverticulosis. No appendicitis. Vascular/Lymphatic: Atherosclerotic calcification of the tortuous abdominal aorta. No dissection or major branch occlusion. No asymmetry of common femoral veins. Negative for adenopathy. There is a cystic mass in the right upper quadrant retroperitoneum, between the duodenum, liver, and kidney. The mass measures up to 5.4 x 3.8 cm and has no visible septation or nodularity. Especially on coronal reformats, the mass has an overall stable appearance. Reproductive:No pathologic findings. Other: No ascites or pneumoperitoneum. Shallow fatty inguinal hernias bilaterally. Musculoskeletal: No acute abnormalities. Diffuse disc and facet degeneration with scoliosis. Multilevel lumbar spinal and foraminal narrowing. Critical Value/emergent results were called by telephone at the time of interpretation on 01/08/2017 at 9:58 am to Dr. [name] , who verbally acknowledged these results. IMPRESSION: 1. Bilateral central pulmonary emboli, recent appearing.  Negative for right heart strain or lung infarct. 2. No mass, inflammation or adenopathy to explain the history of weight loss. 3. Small pulmonary nodules, those seen at the lung bases on 2016 abdominal CT are stable and benign. Previously nonvisualized nodules measure up to 4 mm. If a high risk patient, chest CT follow-up in 1 year can be considered. 4. 5.4 cm right upper quadrant retroperitoneal cyst with benign appearance, consistent with lymphangioma. 5.  Aortic Atherosclerosis (ICD10-I70.0).  Coronary atherosclerosis. 6. Sigmoid diverticulosis. 7. Small bladder diverticulum suggesting chronic outlet obstruction. 8. Moderate hiatal hernia. 9. Cholelithiasis Electronically Signed   By: Monte Fantasia M.D.   On: 01/08/2017 10:00   Ct Abdomen Pelvis W Contrast  Result Date: 01/08/2017 CLINICAL DATA:  Weight loss, over 20 pounds in 4 weeks. EXAM: CT CHEST, ABDOMEN, AND PELVIS WITH CONTRAST TECHNIQUE: Multidetector CT imaging of the chest, abdomen and pelvis was performed following the standard protocol during bolus administration of intravenous contrast. CONTRAST:  141mL ISOVUE-300 IOPAMIDOL (ISOVUE-300) INJECTION 61% COMPARISON:  Abdominal CT 05/02/2015 FINDINGS: CT CHEST FINDINGS Cardiovascular: Normal heart size. No pericardial effusion. Bilateral central pulmonary emboli branching into all lobes. These have a central reason appearance. RV to LV ratio is normal, 0.78. Atherosclerosis with bulky calcifications along the LAD. Mediastinum/Nodes: Moderate sliding hiatal hernia. Negative for adenopathy. Lungs/Pleura: Small pulmonary nodules, those visible on abdominal CT in 2016 are stable and benign. The largest newly seen nodule measures up to 4 mm average diameter. Musculoskeletal: No acute or aggressive finding. CT ABDOMEN PELVIS FINDINGS Hepatobiliary: Few small low densities in the liver are cystic appearing. No suspicious lesion.Cholelithiasis. No evidence of inflammation or duct obstruction. Pancreas:  Unremarkable. Spleen: Unremarkable. Adrenals/Urinary Tract: Negative adrenals. No hydronephrosis or stone. Mild heterogeneous cortical enhancement bilaterally on early phase, which resolves on the later phase. Two simple appearing left renal cysts. Small diverticulum or cellule at the apex of the bladder. Stomach/Bowel: Previously seen cecal wall thickening is not apparent today. Moderate sliding hiatal hernia. No obstruction or inflammatory changes. Sigmoid diverticulosis. No appendicitis. Vascular/Lymphatic: Atherosclerotic calcification of the tortuous abdominal aorta. No dissection or major branch occlusion. No asymmetry of common femoral veins. Negative for adenopathy. There is a cystic mass in the right  upper quadrant retroperitoneum, between the duodenum, liver, and kidney. The mass measures up to 5.4 x 3.8 cm and has no visible septation or nodularity. Especially on coronal reformats, the mass has an overall stable appearance. Reproductive:No pathologic findings. Other: No ascites or pneumoperitoneum. Shallow fatty inguinal hernias bilaterally. Musculoskeletal: No acute abnormalities. Diffuse disc and facet degeneration with scoliosis. Multilevel lumbar spinal and foraminal narrowing. Critical Value/emergent results were called by telephone at the time of interpretation on 01/08/2017 at 9:58 am to Dr. [name] , who verbally acknowledged these results. IMPRESSION: 1. Bilateral central pulmonary emboli, recent appearing. Negative for right heart strain or lung infarct. 2. No mass, inflammation or adenopathy to explain the history of weight loss. 3. Small pulmonary nodules, those seen at the lung bases on 2016 abdominal CT are stable and benign. Previously nonvisualized nodules measure up to 4 mm. If a high risk patient, chest CT follow-up in 1 year can be considered. 4. 5.4 cm right upper quadrant retroperitoneal cyst with benign appearance, consistent with lymphangioma. 5.  Aortic Atherosclerosis  (ICD10-I70.0).  Coronary atherosclerosis. 6. Sigmoid diverticulosis. 7. Small bladder diverticulum suggesting chronic outlet obstruction. 8. Moderate hiatal hernia. 9. Cholelithiasis Electronically Signed   By: Monte Fantasia M.D.   On: 01/08/2017 10:00    EKG: Independently reviewed. Sinus rhythm at 99 bpm. No acute ischemic changes. Q waves in the anterior leads.   Assessment/Plan:   Principal Problem:   Bilateral pulmonary embolism ([place]) Likely triggered from prolonged travel. Abnormal weight loss is alarming for underlying malignancy, however CT scans of the chest, abdomen, and pelvis were negative for masses. Patient reports that his outpatient physician recently checked a PSA. He denies any black or bloody stools. No family history of blood clots. IV heparin 24 hours and transition to novel oral anticoagulant therapy. Patient does have a 5.4 cm retroperitoneal mass of uncertain significance, but appears benign per radiologist interpretation.  Active Problems:   Frequent falls Bed rest 24 hours and obtain PT/OT evaluations.    CAD (coronary artery disease), Hx of LAD DES in 2005 Not on aspirin or Plavix. Stopped Plavix due to bruising too much. Denies chest pain.    Hypertension Continue Bystolic.     Peripheral neuropathy Stopped Neurontin on his own, so will not continue this in the hospital.    Hyperlipidemia with target LDL less than 70 Does not appear to be on active therapy. Heart healthy diet ordered.    Abnormal weight loss Body mass index is 24.77 kg/m. Will obtain dietitian consultation for nutrition analysis. If patient has not had a recent colonoscopy, would consider this as well as an outpatient.  Other information:   DVT prophylaxis: Heparin ordered. Code Status: Full code after discussing with him and his wife what his preferences would be. Family Communication: Wife updated bedside. Disposition Plan: Likely home with home health therapy and 24-48  hours. Consults called: None. Admission status: Observation.  The medical decision making on this patient was of high complexity and the patient is at high risk for clinical deterioration, therefore this is a level 3 visit.   Shylee Durrett [facility] Pager [phone] Cell: [phone]   If 7PM-7AM, please contact night-coverage [email or web address] Password [email or web address] 01/08/2017, 6:30 PM

## 2017-01-08 NOTE — Progress Notes (Signed)
ANTICOAGULATION CONSULT NOTE - Initial Consult  Pharmacy Consult for heparin Indication: pulmonary embolus  Allergies  Allergen Reactions  . Klonopin [Clonazepam] Other (See Comments)    Sleep walking episodes  . Ambien [Zolpidem] Other (See Comments)    Sleep walking episodes     Patient Measurements: Height: 5\' 7"  (170.2 cm) Weight: 158 lb 2 oz (71.7 kg) IBW/kg (Calculated) : 66.1 Heparin Dosing Weight: using total body weight of 71.7 kg documented by ED  Vital Signs: Temp: 98.6 F (37 C) (08/07 1634) Temp Source: Oral (08/07 1634) BP: 144/92 (08/07 1634) Pulse Rate: 100 (08/07 1634)  Labs:  Recent Labs  01/08/17 1659 01/08/17 1714  HGB 14.7 15.6  HCT 43.7 46.0  PLT 160  --   CREATININE  --  1.00    Estimated Creatinine Clearance: 49.6 mL/min (by C-G formula based on SCr of 1 mg/dL).   Medical History: Past Medical History:  Diagnosis Date  . Abnormality of gait 11/05/2013  . CAD (coronary artery disease) 2005   stent to LAD by Dr. Glade Lloyd. mild irregulatities of RCA.    [name] CRF (chronic renal failure)   . ED (erectile dysfunction)   . H/O exercise stress test 05/2012   no statistically significant ischemia.   [name] History of TIA (transient ischemic attack)    transient confusion  . HOH (hard of hearing)    hearing aids  . Hyperlipidemia LDL goal < 70   . Hypertension   . Insomnia   . Macular degeneration    bilateral  . Macular degeneration    Right>left  . OSA (obstructive sleep apnea)    mild, never on cpap  . Peripheral neuropathy   . Vitamin D deficiency     Assessment: 44 yoM presenting with significant weight loss. CT chest revealing bilateral central pulmonary emboli, recent appearing. Negative for right heart strain or lung infarct.  Will be started on heparin infusion.    Not on anticoagulants documented PTA. Baseline Hgb and platelets WNL Baseline anticoag labs pending  Goal of Therapy:  Heparin level 0.3-0.7 units/ml Monitor  platelets by anticoagulation protocol: Yes   Plan:  Heparin 3500 unit bolus x 1 then start infusion at 1100 units/hr. Check heparin level in 8 hours. Daily heparin level and CBC while on heparin infusion.  Hershal Coria 01/08/2017,5:27 PM

## 2017-01-08 NOTE — ED Provider Notes (Signed)
[facility] DEPT Provider Note   CSN: [id number] Arrival date & time: 01/08/17  1602     History   Chief Complaint Chief Complaint  Patient presents with  . Pulmonary Emboli    CT today  . Shortness of Breath    HPI Corey Larson. is a 83 y.o. male.  HPI   83 year old male with past medical history as below including coronary artery disease, hypertension, hyperlipidemia who presents with abnormal CT scan. Patient reportedly has had several weeks of progressively worsening fatigue and weight loss. He had a CT scan for evaluation today and was sent and bilateral pulmonary emboli. Of note, the patient did travel to [place] approximately 2-3 weeks ago. He was in a 6 hour plane ride as well as long car ride. He has noticed increased superficial veins in his bilateral legs over the last several weeks as well. He states he feels generally fatigued. He initially declined any shortness of breath but family states that he does seem to be more short of breath with exertion and seems to be breathing harder than usual. Of note, he was previously on Plavix and stop this due to bleeding. Denies any history of GI bleeds or head bleeds.  Past Medical History:  Diagnosis Date  . Abnormality of gait 11/05/2013  . CAD (coronary artery disease) 2005   stent to LAD by Dr. Glade Lloyd. mild irregulatities of RCA.    [name] CRF (chronic renal failure)   . ED (erectile dysfunction)   . H/O exercise stress test 05/2012   no statistically significant ischemia.   [name] History of TIA (transient ischemic attack)    transient confusion  . HOH (hard of hearing)    hearing aids  . Hyperlipidemia LDL goal < 70   . Hypertension   . Insomnia   . Macular degeneration    bilateral  . Macular degeneration    Right>left  . NSTEMI (non-ST elevated myocardial infarction) ([place]) 04/16/2013  . OSA (obstructive sleep apnea)    mild, never on cpap  . Peripheral neuropathy   . Vitamin D deficiency     Patient Active  Problem List   Diagnosis Date Noted  . Bilateral pulmonary embolism ([place]) 01/08/2017  . Abnormality of gait 11/05/2013  . OSA (obstructive sleep apnea) 07/23/2013  . Erectile dysfunction 07/23/2013  . CAD (coronary artery disease), Hx of LAD DES in 2005 04/15/2013  . Peripheral neuropathy 04/15/2013  . Hyperlipidemia with target LDL less than 70 04/15/2013    Past Surgical History:  Procedure Laterality Date  . ANKLE SURGERY     right ankle 2002 fracture  . BACK SURGERY  1976   laminectomy  . CARDIAC CATHETERIZATION     2005, stent placed DES LAD  . CATARACT EXTRACTION     bilateral  . CORONARY ANGIOPLASTY    . HERNIA REPAIR  1981   "double hernia"  . KNEE SURGERY     right knee 2011   . LEFT HEART CATHETERIZATION WITH CORONARY ANGIOGRAM N/A 04/16/2013   Procedure: LEFT HEART CATHETERIZATION WITH CORONARY ANGIOGRAM;  Surgeon: Leonie Man, MD;  Location: [facility] CATH LAB;  Service: Cardiovascular;  Laterality: N/A;  . NM MYOCAR MULTIPLE W/SPECT  05/13/2012   MILD UPPER SEPTAL THINNING AND DIAPHRAGMATIC ATTENUATION W/O SIGNIFICANT ISCHEMIA. EF 66%.  . TRANSTHORACIC ECHOCARDIOGRAM  05/12/2010   EF =>55%. MILD CONCENTRIC LVH. TRACE MITRAL REGURG.       Home Medications    Prior to Admission medications   Medication  Sig Start Date End Date Taking? Authorizing Provider  albuterol (PROVENTIL HFA;VENTOLIN HFA) 108 (90 BASE) MCG/ACT inhaler Inhale 2 puffs into the lungs every 6 (six) hours as needed for wheezing or shortness of breath. 08/09/14  Yes Donne Hazel, MD  B Complex-C (B-COMPLEX WITH VITAMIN C) tablet Take 1 tablet by mouth daily.   Yes [provider]  BELSOMRA 10 MG TABS Take 1 tablet by mouth daily as needed.  07/17/16  Yes [provider]  calcium carbonate (TUMS - DOSED IN MG ELEMENTAL CALCIUM) 500 MG chewable tablet Chew 1 tablet by mouth as needed for indigestion or heartburn.   Yes [provider]  Cholecalciferol (VITAMIN D PO) Take 1  tablet by mouth daily.   Yes [provider]  Liniments (BLUE-EMU SUPER STRENGTH) CREA Apply 1 application topically 2 (two) times daily. Use on both feet   Yes [provider]  Multiple Vitamins-Minerals (MULTIVITAMIN ADULT PO) Take 1 tablet by mouth daily as needed. Takes One-A-Day for Men   Yes [provider]  nitroGLYCERIN (NITROSTAT) 0.4 MG SL tablet Place 1 tablet (0.4 mg total) under the tongue every 5 (five) minutes x 3 doses as needed for chest pain. 04/26/14  Yes Troy Sine, MD  omeprazole (PRILOSEC) 20 MG capsule Take 20 mg by mouth daily.   Yes [provider]  ranitidine (ZANTAC) 75 MG tablet Take 75 mg by mouth daily as needed for heartburn.   Yes [provider]  gabapentin (NEURONTIN) 800 MG tablet Take 800 mg by mouth 3 (three) times daily.    [provider]  nebivolol (BYSTOLIC) 10 MG tablet Take 5 mg by mouth at bedtime. Takes 1/2 tablet    [provider]  NONFORMULARY OR COMPOUNDED ITEM [facility]  Peripheral Neuropathy Cream- Bupivacaine 1%, Doxepin 3%, Gabapentin 6%, Pentoxifylline 3%, Topiramate 1% Apply 1-2 grams to affected area 3-4 times daily Qty. 120 gm 3 refills Patient not taking: Reported on 01/08/2017 09/07/16   Edrick Kins, DPM    Family History Family History  Problem Relation Age of Onset  . Diabetes Sister   . Alcohol abuse Brother   . Cancer Maternal Grandmother   . Heart disease Paternal Grandfather     Social History Social History  Substance Use Topics  . Smoking status: Former Smoker    Years: 30.00    Types: Cigarettes, Pipe    Quit date: 04/16/1983  . Smokeless tobacco: Never Used  . Alcohol use Yes     Comment: 1/2 glass per evening     Allergies   Klonopin [clonazepam] and Ambien [zolpidem]   Review of Systems Review of Systems  Constitutional: Positive for fatigue. Negative for chills and fever.  HENT: Negative for rhinorrhea.   Eyes: Negative for  visual disturbance.  Respiratory: Positive for cough and shortness of breath. Negative for wheezing.   Cardiovascular: Positive for leg swelling. Negative for chest pain.  Gastrointestinal: Negative for abdominal pain, diarrhea, nausea and vomiting.  Genitourinary: Negative for dysuria and flank pain.  Musculoskeletal: Negative for neck pain and neck stiffness.  Skin: Negative for rash and wound.  Allergic/Immunologic: Negative for immunocompromised state.  Neurological: Negative for syncope, weakness and headaches.  All other systems reviewed and are negative.    Physical Exam Updated Vital Signs BP (!) 167/89   Pulse 78   Temp 98.6 F (37 C) (Oral)   Resp 20   Ht 5\' 7"  (1.702 m)   Wt 70.1 kg (154 lb  8 oz)   SpO2 99%   BMI 24.20 kg/m   Physical Exam  Constitutional: He is oriented to person, place, and time. He appears well-developed and well-nourished. No distress.  HENT:  Head: Normocephalic and atraumatic.  Mouth/Throat: Oropharynx is clear and moist. No oropharyngeal exudate.  Eyes: Pupils are equal, round, and reactive to light. Conjunctivae are normal.  Neck: Neck supple.  Cardiovascular: Regular rhythm and normal heart sounds.  Tachycardia present.  Exam reveals no friction rub.   No murmur heard. Pulmonary/Chest: Effort normal and breath sounds normal. Tachypnea noted. No respiratory distress. He has no wheezes. He has no rales.  Abdominal: Soft. Bowel sounds are normal. He exhibits no distension. There is no tenderness. There is no guarding.  Musculoskeletal: He exhibits no edema.  Neurological: He is alert and oriented to person, place, and time. He exhibits normal muscle tone.  Skin: Skin is warm. Capillary refill takes less than 2 seconds.  Psychiatric: He has a normal mood and affect.  Nursing note and vitals reviewed.    ED Treatments / Results  Labs (all labs ordered are listed, but only abnormal results are displayed) Labs Reviewed  CBC WITH  DIFFERENTIAL/PLATELET - Abnormal; Notable for the following:       Result Value   RDW 17.7 (*)    Monocytes Absolute 1.2 (*)    All other components within normal limits  COMPREHENSIVE METABOLIC PANEL - Abnormal; Notable for the following:    Glucose, Bld 116 (*)    Total Protein 8.4 (*)    ALT 14 (*)    Total Bilirubin 1.5 (*)    GFR calc non Af Amer 57 (*)    All other components within normal limits  BRAIN NATRIURETIC PEPTIDE - Abnormal; Notable for the following:    B Natriuretic Peptide 118.4 (*)    All other components within normal limits  APTT - Abnormal; Notable for the following:    aPTT 37 (*)    All other components within normal limits  CBC - Abnormal; Notable for the following:    RDW 17.5 (*)    Platelets 136 (*)    All other components within normal limits  I-STAT CHEM 8, ED - Abnormal; Notable for the following:    Glucose, Bld 111 (*)    Calcium, Ion 1.14 (*)    All other components within normal limits  PROTIME-INR  HEPARIN LEVEL (UNFRACTIONATED)  HEPARIN LEVEL (UNFRACTIONATED)  I-STAT TROPONIN, ED    EKG  EKG Interpretation  Date/Time:  Tuesday January 08 2017 16:36:20 EDT Ventricular Rate:  99 PR Interval:    QRS Duration: 80 QT Interval:  346 QTC Calculation: 444 R Axis:   -17 Text Interpretation:  Sinus rhythm Borderline left axis deviation Anteroseptal infarct, old Baseline wander in lead(s) V3 No significant change since last tracing Confirmed by Duffy Bruce [phone]) on 01/08/2017 5:57:08 PM       Radiology Ct Chest W Contrast  Result Date: 01/08/2017 CLINICAL DATA:  Weight loss, over 20 pounds in 4 weeks. EXAM: CT CHEST, ABDOMEN, AND PELVIS WITH CONTRAST TECHNIQUE: Multidetector CT imaging of the chest, abdomen and pelvis was performed following the standard protocol during bolus administration of intravenous contrast. CONTRAST:  145mL ISOVUE-300 IOPAMIDOL (ISOVUE-300) INJECTION 61% COMPARISON:  Abdominal CT 05/02/2015 FINDINGS: CT CHEST  FINDINGS Cardiovascular: Normal heart size. No pericardial effusion. Bilateral central pulmonary emboli branching into all lobes. These have a central reason appearance. RV to LV ratio is normal, 0.78. Atherosclerosis with bulky calcifications  along the LAD. Mediastinum/Nodes: Moderate sliding hiatal hernia. Negative for adenopathy. Lungs/Pleura: Small pulmonary nodules, those visible on abdominal CT in 2016 are stable and benign. The largest newly seen nodule measures up to 4 mm average diameter. Musculoskeletal: No acute or aggressive finding. CT ABDOMEN PELVIS FINDINGS Hepatobiliary: Few small low densities in the liver are cystic appearing. No suspicious lesion.Cholelithiasis. No evidence of inflammation or duct obstruction. Pancreas: Unremarkable. Spleen: Unremarkable. Adrenals/Urinary Tract: Negative adrenals. No hydronephrosis or stone. Mild heterogeneous cortical enhancement bilaterally on early phase, which resolves on the later phase. Two simple appearing left renal cysts. Small diverticulum or cellule at the apex of the bladder. Stomach/Bowel: Previously seen cecal wall thickening is not apparent today. Moderate sliding hiatal hernia. No obstruction or inflammatory changes. Sigmoid diverticulosis. No appendicitis. Vascular/Lymphatic: Atherosclerotic calcification of the tortuous abdominal aorta. No dissection or major branch occlusion. No asymmetry of common femoral veins. Negative for adenopathy. There is a cystic mass in the right upper quadrant retroperitoneum, between the duodenum, liver, and kidney. The mass measures up to 5.4 x 3.8 cm and has no visible septation or nodularity. Especially on coronal reformats, the mass has an overall stable appearance. Reproductive:No pathologic findings. Other: No ascites or pneumoperitoneum. Shallow fatty inguinal hernias bilaterally. Musculoskeletal: No acute abnormalities. Diffuse disc and facet degeneration with scoliosis. Multilevel lumbar spinal and foraminal  narrowing. Critical Value/emergent results were called by telephone at the time of interpretation on 01/08/2017 at 9:58 am to Dr. [name] , who verbally acknowledged these results. IMPRESSION: 1. Bilateral central pulmonary emboli, recent appearing. Negative for right heart strain or lung infarct. 2. No mass, inflammation or adenopathy to explain the history of weight loss. 3. Small pulmonary nodules, those seen at the lung bases on 2016 abdominal CT are stable and benign. Previously nonvisualized nodules measure up to 4 mm. If a high risk patient, chest CT follow-up in 1 year can be considered. 4. 5.4 cm right upper quadrant retroperitoneal cyst with benign appearance, consistent with lymphangioma. 5.  Aortic Atherosclerosis (ICD10-I70.0).  Coronary atherosclerosis. 6. Sigmoid diverticulosis. 7. Small bladder diverticulum suggesting chronic outlet obstruction. 8. Moderate hiatal hernia. 9. Cholelithiasis Electronically Signed   By: Monte Fantasia M.D.   On: 01/08/2017 10:00   Ct Abdomen Pelvis W Contrast  Result Date: 01/08/2017 CLINICAL DATA:  Weight loss, over 20 pounds in 4 weeks. EXAM: CT CHEST, ABDOMEN, AND PELVIS WITH CONTRAST TECHNIQUE: Multidetector CT imaging of the chest, abdomen and pelvis was performed following the standard protocol during bolus administration of intravenous contrast. CONTRAST:  183mL ISOVUE-300 IOPAMIDOL (ISOVUE-300) INJECTION 61% COMPARISON:  Abdominal CT 05/02/2015 FINDINGS: CT CHEST FINDINGS Cardiovascular: Normal heart size. No pericardial effusion. Bilateral central pulmonary emboli branching into all lobes. These have a central reason appearance. RV to LV ratio is normal, 0.78. Atherosclerosis with bulky calcifications along the LAD. Mediastinum/Nodes: Moderate sliding hiatal hernia. Negative for adenopathy. Lungs/Pleura: Small pulmonary nodules, those visible on abdominal CT in 2016 are stable and benign. The largest newly seen nodule measures up to 4 mm average diameter.  Musculoskeletal: No acute or aggressive finding. CT ABDOMEN PELVIS FINDINGS Hepatobiliary: Few small low densities in the liver are cystic appearing. No suspicious lesion.Cholelithiasis. No evidence of inflammation or duct obstruction. Pancreas: Unremarkable. Spleen: Unremarkable. Adrenals/Urinary Tract: Negative adrenals. No hydronephrosis or stone. Mild heterogeneous cortical enhancement bilaterally on early phase, which resolves on the later phase. Two simple appearing left renal cysts. Small diverticulum or cellule at the apex of the bladder. Stomach/Bowel: Previously seen cecal wall thickening  is not apparent today. Moderate sliding hiatal hernia. No obstruction or inflammatory changes. Sigmoid diverticulosis. No appendicitis. Vascular/Lymphatic: Atherosclerotic calcification of the tortuous abdominal aorta. No dissection or major branch occlusion. No asymmetry of common femoral veins. Negative for adenopathy. There is a cystic mass in the right upper quadrant retroperitoneum, between the duodenum, liver, and kidney. The mass measures up to 5.4 x 3.8 cm and has no visible septation or nodularity. Especially on coronal reformats, the mass has an overall stable appearance. Reproductive:No pathologic findings. Other: No ascites or pneumoperitoneum. Shallow fatty inguinal hernias bilaterally. Musculoskeletal: No acute abnormalities. Diffuse disc and facet degeneration with scoliosis. Multilevel lumbar spinal and foraminal narrowing. Critical Value/emergent results were called by telephone at the time of interpretation on 01/08/2017 at 9:58 am to Dr. [name] , who verbally acknowledged these results. IMPRESSION: 1. Bilateral central pulmonary emboli, recent appearing. Negative for right heart strain or lung infarct. 2. No mass, inflammation or adenopathy to explain the history of weight loss. 3. Small pulmonary nodules, those seen at the lung bases on 2016 abdominal CT are stable and benign. Previously nonvisualized  nodules measure up to 4 mm. If a high risk patient, chest CT follow-up in 1 year can be considered. 4. 5.4 cm right upper quadrant retroperitoneal cyst with benign appearance, consistent with lymphangioma. 5.  Aortic Atherosclerosis (ICD10-I70.0).  Coronary atherosclerosis. 6. Sigmoid diverticulosis. 7. Small bladder diverticulum suggesting chronic outlet obstruction. 8. Moderate hiatal hernia. 9. Cholelithiasis Electronically Signed   By: Monte Fantasia M.D.   On: 01/08/2017 10:00    Procedures Procedures (including critical care time)  CRITICAL CARE Performed by: Evonnie Pat   Total critical care time: 35 minutes  Critical care time was exclusive of separately billable procedures and treating other patients.  Critical care was necessary to treat or prevent imminent or life-threatening deterioration.  Critical care was time spent personally by me on the following activities: development of treatment plan with patient and/or surrogate as well as nursing, discussions with consultants, evaluation of patient's response to treatment, examination of patient, obtaining history from patient or surrogate, ordering and performing treatments and interventions, ordering and review of laboratory studies, ordering and review of radiographic studies, pulse oximetry and re-evaluation of patient's condition.   Medications Ordered in ED Medications  heparin ADULT infusion 100 units/mL (25000 units/265mL sodium chloride 0.45%) (1,100 Units/hr Intravenous Restarted 01/09/17 0536)  nebivolol (BYSTOLIC) tablet 5 mg (5 mg Oral Given 01/08/17 2132)  pantoprazole (PROTONIX) EC tablet 40 mg (40 mg Oral Given 01/08/17 2132)  Suvorexant TABS 1 tablet (not administered)  calcium carbonate (TUMS - dosed in mg elemental calcium) chewable tablet 200 mg of elemental calcium (not administered)  cholecalciferol (VITAMIN D) tablet 1,000 Units (1,000 Units Oral Given 01/08/17 2131)  albuterol (PROVENTIL) (2.5 MG/3ML) 0.083%  nebulizer solution 2.5 mg (not administered)  nitroGLYCERIN (NITROSTAT) SL tablet 0.4 mg (not administered)  B-complex with vitamin C tablet 1 tablet (1 tablet Oral Given 01/08/17 2132)  acetaminophen (TYLENOL) tablet 650 mg (not administered)    Or  acetaminophen (TYLENOL) suppository 650 mg (not administered)  ondansetron (ZOFRAN) tablet 4 mg (not administered)    Or  ondansetron (ZOFRAN) injection 4 mg (not administered)  hydrocerin (EUCERIN) cream ( Topical Given 01/08/17 2132)  hydrALAZINE (APRESOLINE) injection 10 mg (not administered)  haloperidol (HALDOL) tablet 1 mg ( Oral See Alternative 01/09/17 1003)    Or  haloperidol lactate (HALDOL) injection 1 mg (1 mg Intravenous Given 01/09/17 1003)  heparin bolus via infusion 3,500 Units (  3,500 Units Intravenous Bolus from Bag 01/08/17 1756)  heparin bolus via infusion 2,000 Units (2,000 Units Intravenous Bolus from Bag 01/09/17 0552)     Initial Impression / Assessment and Plan / ED Course  I have reviewed the triage vital signs and the nursing notes.  Pertinent labs & imaging results that were available during my care of the patient were reviewed by me and considered in my medical decision making (see chart for details).     83 year old male here with bilateral main lobar pulmonary emboli. Patient is tachycardic with elevated BNP consistent with submassive PE with very high risk PESI score. He has no hypotension or signs of shock/massive PE. Will start on heparin and admit. Pt updated and in agreement. Suspect PE is likely from DVT in setting of recent travel - will need U/S.  Final Clinical Impressions(s) / ED Diagnoses   Final diagnoses:  Bilateral pulmonary embolism [facility])    New Prescriptions Current Discharge Medication List       Duffy Bruce, MD 01/09/17 1118

## 2017-01-08 NOTE — ED Notes (Signed)
ED TO INPATIENT HANDOFF REPORT  Name/Age/Gender Corey Larson. 83 y.o. male  Home/SNF/Other Home  Chief Complaint SOB; Sent from dr  Code Status History    Date Active Date Inactive Code Status Order ID Comments User Context   08/07/2014  9:58 PM 08/09/2014  8:42 PM Full Code [id number]  Etta Quill, DO ED   04/15/2013  7:30 PM 04/17/2013  4:26 PM Full Code [id number]  Isaiah Serge, NP Inpatient    Advance Directive Documentation     Most Recent Value  Type of Advance Directive  Healthcare Power of Attorney, Living will  Pre-existing out of facility DNR order (yellow form or pink MOST form)  -  "MOST" Form in Place?  -      Level of Care/Admitting Diagnosis ED Disposition    ED Disposition Condition Comment   Admit  Hospital Area: [place] [[id number]]  Level of Care: Telemetry [5]  Admit to tele based on following criteria: Monitor for Ischemic changes  Diagnosis: Bilateral pulmonary embolism [facility]) [[id number]]  Admitting Physician: Tonye Royalty [[id number]]  Attending Physician: RAMA, CHRISTINA P [[id number]]  PT Class (Do Not Modify): Observation [104]  PT Acc Code (Do Not Modify): Observation [10022]       Medical History Past Medical History:  Diagnosis Date  . Abnormality of gait 11/05/2013  . CAD (coronary artery disease) 2005   stent to LAD by Dr. Glade Lloyd. mild irregulatities of RCA.    [name] CRF (chronic renal failure)   . ED (erectile dysfunction)   . H/O exercise stress test 05/2012   no statistically significant ischemia.   [name] History of TIA (transient ischemic attack)    transient confusion  . HOH (hard of hearing)    hearing aids  . Hyperlipidemia LDL goal < 70   . Hypertension   . Insomnia   . Macular degeneration    bilateral  . Macular degeneration    Right>left  . NSTEMI (non-ST elevated myocardial infarction) ([place]) 04/16/2013  . OSA (obstructive sleep apnea)    mild, never on cpap  . Peripheral neuropathy   . Vitamin D  deficiency     Allergies Allergies  Allergen Reactions  . Klonopin [Clonazepam] Other (See Comments)    Sleep walking episodes  . Ambien [Zolpidem] Other (See Comments)    Sleep walking episodes     IV Location/Drains/Wounds Patient Lines/Drains/Airways Status   Active Line/Drains/Airways    Name:   Placement date:   Placement time:   Site:   Days:   Peripheral IV 01/08/17 Right Forearm  01/08/17    1704    Forearm    less than 1          Labs/Imaging Results for orders placed or performed during the hospital encounter of 01/08/17 (from the past 48 hour(s))  CBC with Differential     Status: Abnormal   Collection Time: 01/08/17  4:59 PM  Result Value Ref Range   WBC 8.7 4.0 - 10.5 K/uL   RBC 5.30 4.22 - 5.81 MIL/uL   Hemoglobin 14.7 13.0 - 17.0 g/dL   HCT 43.7 39.0 - 52.0 %   MCV 82.5 78.0 - 100.0 fL   MCH 27.7 26.0 - 34.0 pg   MCHC 33.6 30.0 - 36.0 g/dL   RDW 17.7 (H) 11.5 - 15.5 %   Platelets 160 150 - 400 K/uL   Neutrophils Relative % 65 %   Neutro Abs 5.6 1.7 - 7.7 K/uL   Lymphocytes  Relative 21 %   Lymphs Abs 1.8 0.7 - 4.0 K/uL   Monocytes Relative 13 %   Monocytes Absolute 1.2 (H) 0.1 - 1.0 K/uL   Eosinophils Relative 1 %   Eosinophils Absolute 0.0 0.0 - 0.7 K/uL   Basophils Relative 0 %   Basophils Absolute 0.0 0.0 - 0.1 K/uL  Comprehensive metabolic panel     Status: Abnormal   Collection Time: 01/08/17  4:59 PM  Result Value Ref Range   Sodium 140 135 - 145 mmol/L   Potassium 4.0 3.5 - 5.1 mmol/L   Chloride 104 101 - 111 mmol/L   CO2 24 22 - 32 mmol/L   Glucose, Bld 116 (H) 65 - 99 mg/dL   BUN 11 6 - 20 mg/dL   Creatinine, Ser 1.13 0.61 - 1.24 mg/dL   Calcium 9.4 8.9 - 10.3 mg/dL   Total Protein 8.4 (H) 6.5 - 8.1 g/dL   Albumin 4.3 3.5 - 5.0 g/dL   AST 24 15 - 41 U/L   ALT 14 (L) 17 - 63 U/L   Alkaline Phosphatase 67 38 - 126 U/L   Total Bilirubin 1.5 (H) 0.3 - 1.2 mg/dL   GFR calc non Af Amer 57 (L) >60 mL/min   GFR calc Af Amer >60 >60 mL/min     Comment: (NOTE) The eGFR has been calculated using the CKD EPI equation. This calculation has not been validated in all clinical situations. eGFR's persistently <60 mL/min signify possible Chronic Kidney Disease.    Anion gap 12 5 - 15  Brain natriuretic peptide     Status: Abnormal   Collection Time: 01/08/17  4:59 PM  Result Value Ref Range   B Natriuretic Peptide 118.4 (H) 0.0 - 100.0 pg/mL  I-Stat Troponin, ED (not at [facility]     Status: None   Collection Time: 01/08/17  5:12 PM  Result Value Ref Range   Troponin i, poc 0.00 0.00 - 0.08 ng/mL   Comment 3            Comment: Due to the release kinetics of cTnI, a negative result within the first hours of the onset of symptoms does not rule out myocardial infarction with certainty. If myocardial infarction is still suspected, repeat the test at appropriate intervals.   I-Stat Chem 8, ED     Status: Abnormal   Collection Time: 01/08/17  5:14 PM  Result Value Ref Range   Sodium 141 135 - 145 mmol/L   Potassium 3.8 3.5 - 5.1 mmol/L   Chloride 104 101 - 111 mmol/L   BUN 10 6 - 20 mg/dL   Creatinine, Ser 1.00 0.61 - 1.24 mg/dL   Glucose, Bld 111 (H) 65 - 99 mg/dL   Calcium, Ion 1.14 (L) 1.15 - 1.40 mmol/L   TCO2 23 0 - 100 mmol/L   Hemoglobin 15.6 13.0 - 17.0 g/dL   HCT 46.0 39.0 - 52.0 %  APTT     Status: Abnormal   Collection Time: 01/08/17  5:15 PM  Result Value Ref Range   aPTT 37 (H) 24 - 36 seconds    Comment:        IF BASELINE aPTT IS ELEVATED, SUGGEST PATIENT RISK ASSESSMENT BE USED TO DETERMINE APPROPRIATE ANTICOAGULANT THERAPY.   Protime-INR     Status: None   Collection Time: 01/08/17  5:15 PM  Result Value Ref Range   Prothrombin Time 14.5 11.4 - 15.2 seconds   INR 1.13    Ct Chest W Contrast  Result Date: 01/08/2017 CLINICAL DATA:  Weight loss, over 20 pounds in 4 weeks. EXAM: CT CHEST, ABDOMEN, AND PELVIS WITH CONTRAST TECHNIQUE: Multidetector CT imaging of the chest, abdomen and pelvis was performed  following the standard protocol during bolus administration of intravenous contrast. CONTRAST:  174m ISOVUE-300 IOPAMIDOL (ISOVUE-300) INJECTION 61% COMPARISON:  Abdominal CT 05/02/2015 FINDINGS: CT CHEST FINDINGS Cardiovascular: Normal heart size. No pericardial effusion. Bilateral central pulmonary emboli branching into all lobes. These have a central reason appearance. RV to LV ratio is normal, 0.78. Atherosclerosis with bulky calcifications along the LAD. Mediastinum/Nodes: Moderate sliding hiatal hernia. Negative for adenopathy. Lungs/Pleura: Small pulmonary nodules, those visible on abdominal CT in 2016 are stable and benign. The largest newly seen nodule measures up to 4 mm average diameter. Musculoskeletal: No acute or aggressive finding. CT ABDOMEN PELVIS FINDINGS Hepatobiliary: Few small low densities in the liver are cystic appearing. No suspicious lesion.Cholelithiasis. No evidence of inflammation or duct obstruction. Pancreas: Unremarkable. Spleen: Unremarkable. Adrenals/Urinary Tract: Negative adrenals. No hydronephrosis or stone. Mild heterogeneous cortical enhancement bilaterally on early phase, which resolves on the later phase. Two simple appearing left renal cysts. Small diverticulum or cellule at the apex of the bladder. Stomach/Bowel: Previously seen cecal wall thickening is not apparent today. Moderate sliding hiatal hernia. No obstruction or inflammatory changes. Sigmoid diverticulosis. No appendicitis. Vascular/Lymphatic: Atherosclerotic calcification of the tortuous abdominal aorta. No dissection or major branch occlusion. No asymmetry of common femoral veins. Negative for adenopathy. There is a cystic mass in the right upper quadrant retroperitoneum, between the duodenum, liver, and kidney. The mass measures up to 5.4 x 3.8 cm and has no visible septation or nodularity. Especially on coronal reformats, the mass has an overall stable appearance. Reproductive:No pathologic findings. Other:  No ascites or pneumoperitoneum. Shallow fatty inguinal hernias bilaterally. Musculoskeletal: No acute abnormalities. Diffuse disc and facet degeneration with scoliosis. Multilevel lumbar spinal and foraminal narrowing. Critical Value/emergent results were called by telephone at the time of interpretation on 01/08/2017 at 9:58 am to Dr. JJani Gravel, who verbally acknowledged these results. IMPRESSION: 1. Bilateral central pulmonary emboli, recent appearing. Negative for right heart strain or lung infarct. 2. No mass, inflammation or adenopathy to explain the history of weight loss. 3. Small pulmonary nodules, those seen at the lung bases on 2016 abdominal CT are stable and benign. Previously nonvisualized nodules measure up to 4 mm. If a high risk patient, chest CT follow-up in 1 year can be considered. 4. 5.4 cm right upper quadrant retroperitoneal cyst with benign appearance, consistent with lymphangioma. 5.  Aortic Atherosclerosis (ICD10-I70.0).  Coronary atherosclerosis. 6. Sigmoid diverticulosis. 7. Small bladder diverticulum suggesting chronic outlet obstruction. 8. Moderate hiatal hernia. 9. Cholelithiasis Electronically Signed   By: JMonte FantasiaM.D.   On: 01/08/2017 10:00   Ct Abdomen Pelvis W Contrast  Result Date: 01/08/2017 CLINICAL DATA:  Weight loss, over 20 pounds in 4 weeks. EXAM: CT CHEST, ABDOMEN, AND PELVIS WITH CONTRAST TECHNIQUE: Multidetector CT imaging of the chest, abdomen and pelvis was performed following the standard protocol during bolus administration of intravenous contrast. CONTRAST:  1032mISOVUE-300 IOPAMIDOL (ISOVUE-300) INJECTION 61% COMPARISON:  Abdominal CT 05/02/2015 FINDINGS: CT CHEST FINDINGS Cardiovascular: Normal heart size. No pericardial effusion. Bilateral central pulmonary emboli branching into all lobes. These have a central reason appearance. RV to LV ratio is normal, 0.78. Atherosclerosis with bulky calcifications along the LAD. Mediastinum/Nodes: Moderate sliding  hiatal hernia. Negative for adenopathy. Lungs/Pleura: Small pulmonary nodules, those visible on abdominal CT in 2016 are  stable and benign. The largest newly seen nodule measures up to 4 mm average diameter. Musculoskeletal: No acute or aggressive finding. CT ABDOMEN PELVIS FINDINGS Hepatobiliary: Few small low densities in the liver are cystic appearing. No suspicious lesion.Cholelithiasis. No evidence of inflammation or duct obstruction. Pancreas: Unremarkable. Spleen: Unremarkable. Adrenals/Urinary Tract: Negative adrenals. No hydronephrosis or stone. Mild heterogeneous cortical enhancement bilaterally on early phase, which resolves on the later phase. Two simple appearing left renal cysts. Small diverticulum or cellule at the apex of the bladder. Stomach/Bowel: Previously seen cecal wall thickening is not apparent today. Moderate sliding hiatal hernia. No obstruction or inflammatory changes. Sigmoid diverticulosis. No appendicitis. Vascular/Lymphatic: Atherosclerotic calcification of the tortuous abdominal aorta. No dissection or major branch occlusion. No asymmetry of common femoral veins. Negative for adenopathy. There is a cystic mass in the right upper quadrant retroperitoneum, between the duodenum, liver, and kidney. The mass measures up to 5.4 x 3.8 cm and has no visible septation or nodularity. Especially on coronal reformats, the mass has an overall stable appearance. Reproductive:No pathologic findings. Other: No ascites or pneumoperitoneum. Shallow fatty inguinal hernias bilaterally. Musculoskeletal: No acute abnormalities. Diffuse disc and facet degeneration with scoliosis. Multilevel lumbar spinal and foraminal narrowing. Critical Value/emergent results were called by telephone at the time of interpretation on 01/08/2017 at 9:58 am to Dr. [name] , who verbally acknowledged these results. IMPRESSION: 1. Bilateral central pulmonary emboli, recent appearing. Negative for right heart strain or lung  infarct. 2. No mass, inflammation or adenopathy to explain the history of weight loss. 3. Small pulmonary nodules, those seen at the lung bases on 2016 abdominal CT are stable and benign. Previously nonvisualized nodules measure up to 4 mm. If a high risk patient, chest CT follow-up in 1 year can be considered. 4. 5.4 cm right upper quadrant retroperitoneal cyst with benign appearance, consistent with lymphangioma. 5.  Aortic Atherosclerosis (ICD10-I70.0).  Coronary atherosclerosis. 6. Sigmoid diverticulosis. 7. Small bladder diverticulum suggesting chronic outlet obstruction. 8. Moderate hiatal hernia. 9. Cholelithiasis Electronically Signed   By: Monte Fantasia M.D.   On: 01/08/2017 10:00    Pending Labs Unresulted Labs    Start     Ordered   01/09/17 0500  CBC  Daily,   R     01/08/17 1735   01/09/17 0200  Heparin level (unfractionated)  Once-Timed,   R     01/08/17 1735      Isolation Precautions No active isolations  Vitals/Pain Today's Vitals   01/08/17 1730 01/08/17 1800 01/08/17 1830 01/08/17 1900  BP: 132/88 (!) 147/94 (!) 160/91 (!) 156/93  Pulse: 92 88    Resp: 15 (!) 21 11 (!) 21  Temp:      TempSrc:      SpO2: 97% 100%    Weight:      Height:        Medications Medications  heparin ADULT infusion 100 units/mL (25000 units/230m sodium chloride 0.45%) (1,100 Units/hr Intravenous New Bag/Given 01/08/17 1755)  heparin bolus via infusion 3,500 Units (3,500 Units Intravenous Bolus from Bag 01/08/17 1756)    Mobility Walk

## 2017-01-09 ENCOUNTER — Observation Stay (HOSPITAL_COMMUNITY): Payer: Medicare Other

## 2017-01-09 DIAGNOSIS — Z87891 Personal history of nicotine dependence: Secondary | ICD-10-CM | POA: Diagnosis not present

## 2017-01-09 DIAGNOSIS — I82441 Acute embolism and thrombosis of right tibial vein: Secondary | ICD-10-CM | POA: Diagnosis present

## 2017-01-09 DIAGNOSIS — F0391 Unspecified dementia with behavioral disturbance: Secondary | ICD-10-CM | POA: Diagnosis present

## 2017-01-09 DIAGNOSIS — I2699 Other pulmonary embolism without acute cor pulmonale: Secondary | ICD-10-CM

## 2017-01-09 DIAGNOSIS — Z955 Presence of coronary angioplasty implant and graft: Secondary | ICD-10-CM | POA: Diagnosis not present

## 2017-01-09 DIAGNOSIS — G629 Polyneuropathy, unspecified: Secondary | ICD-10-CM | POA: Diagnosis present

## 2017-01-09 DIAGNOSIS — I2692 Saddle embolus of pulmonary artery without acute cor pulmonale: Secondary | ICD-10-CM | POA: Diagnosis not present

## 2017-01-09 DIAGNOSIS — I824Y9 Acute embolism and thrombosis of unspecified deep veins of unspecified proximal lower extremity: Secondary | ICD-10-CM | POA: Diagnosis not present

## 2017-01-09 DIAGNOSIS — I129 Hypertensive chronic kidney disease with stage 1 through stage 4 chronic kidney disease, or unspecified chronic kidney disease: Secondary | ICD-10-CM | POA: Diagnosis present

## 2017-01-09 DIAGNOSIS — R296 Repeated falls: Secondary | ICD-10-CM | POA: Diagnosis present

## 2017-01-09 DIAGNOSIS — I82431 Acute embolism and thrombosis of right popliteal vein: Secondary | ICD-10-CM | POA: Diagnosis present

## 2017-01-09 DIAGNOSIS — Z833 Family history of diabetes mellitus: Secondary | ICD-10-CM | POA: Diagnosis not present

## 2017-01-09 DIAGNOSIS — H3530 Unspecified macular degeneration: Secondary | ICD-10-CM | POA: Diagnosis present

## 2017-01-09 DIAGNOSIS — I82411 Acute embolism and thrombosis of right femoral vein: Secondary | ICD-10-CM | POA: Diagnosis present

## 2017-01-09 DIAGNOSIS — N189 Chronic kidney disease, unspecified: Secondary | ICD-10-CM | POA: Diagnosis present

## 2017-01-09 DIAGNOSIS — R634 Abnormal weight loss: Secondary | ICD-10-CM | POA: Diagnosis present

## 2017-01-09 DIAGNOSIS — I2510 Atherosclerotic heart disease of native coronary artery without angina pectoris: Secondary | ICD-10-CM | POA: Diagnosis present

## 2017-01-09 DIAGNOSIS — E785 Hyperlipidemia, unspecified: Secondary | ICD-10-CM | POA: Diagnosis present

## 2017-01-09 DIAGNOSIS — I252 Old myocardial infarction: Secondary | ICD-10-CM | POA: Diagnosis not present

## 2017-01-09 DIAGNOSIS — M542 Cervicalgia: Secondary | ICD-10-CM | POA: Diagnosis not present

## 2017-01-09 DIAGNOSIS — Z6824 Body mass index (BMI) 24.0-24.9, adult: Secondary | ICD-10-CM | POA: Diagnosis not present

## 2017-01-09 DIAGNOSIS — Z8249 Family history of ischemic heart disease and other diseases of the circulatory system: Secondary | ICD-10-CM | POA: Diagnosis not present

## 2017-01-09 LAB — CBC
HCT: 39.8 % (ref 39.0–52.0)
Hemoglobin: 13.4 g/dL (ref 13.0–17.0)
MCH: 27.3 pg (ref 26.0–34.0)
MCHC: 33.7 g/dL (ref 30.0–36.0)
MCV: 81.2 fL (ref 78.0–100.0)
PLATELETS: 136 10*3/uL — AB (ref 150–400)
RBC: 4.9 MIL/uL (ref 4.22–5.81)
RDW: 17.5 % — ABNORMAL HIGH (ref 11.5–15.5)
WBC: 7.7 10*3/uL (ref 4.0–10.5)

## 2017-01-09 LAB — HEPARIN LEVEL (UNFRACTIONATED)
HEPARIN UNFRACTIONATED: 0.47 [IU]/mL (ref 0.30–0.70)
Heparin Unfractionated: 0.62 IU/mL (ref 0.30–0.70)

## 2017-01-09 MED ORDER — LORAZEPAM 2 MG/ML IJ SOLN
0.2500 mg | Freq: Once | INTRAMUSCULAR | Status: AC
  Administered 2017-01-09: 0.25 mg via INTRAVENOUS
  Filled 2017-01-09: qty 1

## 2017-01-09 MED ORDER — ENSURE ENLIVE PO LIQD
237.0000 mL | Freq: Two times a day (BID) | ORAL | Status: DC
  Administered 2017-01-10 – 2017-01-11 (×2): 237 mL via ORAL

## 2017-01-09 MED ORDER — ADULT MULTIVITAMIN W/MINERALS CH
1.0000 | ORAL_TABLET | Freq: Every day | ORAL | Status: DC
  Administered 2017-01-10 – 2017-01-11 (×2): 1 via ORAL
  Filled 2017-01-09 (×2): qty 1

## 2017-01-09 MED ORDER — HALOPERIDOL LACTATE 5 MG/ML IJ SOLN
1.0000 mg | Freq: Four times a day (QID) | INTRAMUSCULAR | Status: DC | PRN
  Filled 2017-01-09: qty 1

## 2017-01-09 MED ORDER — HEPARIN BOLUS VIA INFUSION
3500.0000 [IU] | Freq: Once | INTRAVENOUS | Status: DC
  Filled 2017-01-09: qty 3500

## 2017-01-09 MED ORDER — HALOPERIDOL 1 MG PO TABS
1.0000 mg | ORAL_TABLET | Freq: Four times a day (QID) | ORAL | Status: DC | PRN
  Filled 2017-01-09: qty 1

## 2017-01-09 MED ORDER — HALOPERIDOL LACTATE 5 MG/ML IJ SOLN
1.0000 mg | Freq: Four times a day (QID) | INTRAMUSCULAR | Status: DC | PRN
  Administered 2017-01-09 – 2017-01-10 (×3): 1 mg via INTRAVENOUS
  Filled 2017-01-09 (×2): qty 1

## 2017-01-09 MED ORDER — HEPARIN BOLUS VIA INFUSION
2000.0000 [IU] | Freq: Once | INTRAVENOUS | Status: AC
  Administered 2017-01-09: 2000 [IU] via INTRAVENOUS
  Filled 2017-01-09: qty 2000

## 2017-01-09 NOTE — Care Management Note (Signed)
Case Management Note  Patient Details  Name: Corey Larson. MRN: [id number] Date of Birth: 1927-10-25  Subjective/Objective:  83 y/o m admitted w/Bilateral PE. From home w/spouse. 1:1. PT cons-await recc.                  Action/Plan:d/c plan home.   Expected Discharge Date:   (unknown)               Expected Discharge Plan:  Home/Self Care  In-House Referral:     Discharge planning Services  CM Consult  Post Acute Care Choice:    Choice offered to:     DME Arranged:    DME Agency:     HH Arranged:    HH Agency:     Status of Service:  In process, will continue to follow  If discussed at Long Length of Stay Meetings, dates discussed:    Additional Comments:  Dessa Phi, RN 01/09/2017, 11:47 AM

## 2017-01-09 NOTE — Care Management Obs Status (Signed)
[facility] NOTIFICATION   Patient Details  Name: Corey Larson. MRN: [id number] Date of Birth: 07-Sep-1927   Medicare Observation Status Notification Given:  Yes    Dessa Phi, RN 01/09/2017, 11:48 AM

## 2017-01-09 NOTE — Progress Notes (Signed)
MD Hal Hope paged and made aware of pt. Increasingly being combative towards patients. Safety of patient is a concern. Pt. Educated repeatedly for his concern of safety. Pt. Refusing education. Verbal orders given for 0.25 mg IV ativan given. Will administer and monitor pt. Closely.

## 2017-01-09 NOTE — Progress Notes (Signed)
Pt. Being combative, verbally and physically abusive towards staff. 1 on 1 sitter present in the room along with 2 RN's. Haldol IV given. Relaxation techniques performed. Diversional interventions ineffective. Pt. Increasingly being combative. MD on call paged and made aware. Will continue to monitor and will carry out any new orders.

## 2017-01-09 NOTE — Progress Notes (Signed)
Initial Nutrition Assessment  DOCUMENTATION CODES:   Not applicable  INTERVENTION:   Ensure Enlive po BID, each supplement provides 350 kcal and 20 grams of protein  MVI  NUTRITION DIAGNOSIS:   Unintentional weight loss related to  (unknown etiology ) as evidenced by 10 percent weight loss in 4 months.  GOAL:   Patient will meet greater than or equal to 90% of their needs  MONITOR:   PO intake, Supplement acceptance, Labs, Weight trends  REASON FOR ASSESSMENT:   Malnutrition Screening Tool    ASSESSMENT:   83 y.o. male with a PMH of CAD status post DES to the LAD in 2005 who discontinued Plavix many years ago, hyperlipidemia, peripheral neuropathy and hypertension who is being evaluated by his PCP for abnormal weight loss and in the course of his workup, underwent CT scanning of his chest, abdomen and pelvis today which showed bilateral pulmonary emboli.   Met with pt in room today. Pt reports intermittent poor appetite pta. Per chart, pt has lost 16lbs(10%) in 4 months; this is significant. Pt has not been drinking any protein supplements at home; RD recommended Ensure. Pt is wiling to try Ensure. Pt also reports tastes changes; RD will order multivitamin. Pt is currently eating 100% of meals in hospital.    Medications reviewed and include: B complex w. Vit C, Vit D, protonix, heparin   Labs reviewed: iCa- 1.14(L), tbili 1.5(H)  Nutrition-Focused physical exam completed. Findings are no fat depletion, mild to moderate muscle depletions in lower legs, clavicles, and hands, and no edema.   Diet Order:  Diet Heart Room service appropriate? Yes; Fluid consistency: Thin  Skin:  Reviewed, no issues  Last BM:  8/7  Height:   Ht Readings from Last 1 Encounters:  01/08/17 '5\' 7"'  (1.702 m)    Weight:   Wt Readings from Last 1 Encounters:  01/08/17 154 lb 8 oz (70.1 kg)    Ideal Body Weight:  67.2 kg  BMI:  Body mass index is 24.2 kg/m.  Estimated Nutritional  Needs:   Kcal:  1900-2200kcal/day   Protein:  77-91g/day   Fluid:  >1.9L/day   EDUCATION NEEDS:   Education needs addressed  Koleen Distance MS, RD, LDN Pager #[phone] After Hours Pager: [phone]

## 2017-01-09 NOTE — Plan of Care (Signed)
Problem: Safety: Goal: Ability to remain free from injury will improve Outcome: Progressing Not following commands, impulsive.  Sitter at bedside as well as spouse.   Problem: Pain Managment: Goal: General experience of comfort will improve Outcome: Progressing Denies pain at this time.  Will continue to monitor.   Problem: Activity: Goal: Risk for activity intolerance will decrease Outcome: Progressing Up with standby assist, not following commands.

## 2017-01-09 NOTE — Progress Notes (Signed)
At start of shift, pt kept getting out of bed without calling. He is alert, and oriented to person, not oriented to place.  I explained situation and that we need him to have assistance out of bed for his safety, he is on a heparin drip and a high risk for injury.  He stated he understands.   Continued refusing to stay in the bed.  Notified MD and got an order for a tele sitter.  Started remote monitoring.  Pt still non-compliant and refusing to stay in the bed.  He asked to speak to director.  She was not available.  Surveyor, quantity went in and talked with pt.  He was not satisfied and asked to speak with someone else. He also pulled out his IV.  AC came and spoke with pt. He agreed to have IV reinserted but continued to refuse to stay in bed.  Canceled tele sitter and changed to sitter at bedside.  IV being reinserted and heparin will be restarted.

## 2017-01-09 NOTE — Progress Notes (Signed)
PROGRESS NOTE    Corey Larson.  HEN:[id number] DOB: 05-05-1928 DOA: 01/08/2017 PCP: Thressa Sheller, MD   Brief Narrative:   83 year old male with history of coronary artery disease status post drug-eluting stent in LAD in 2005, peripheral neuropathy, hypertension, hyperlipidemia and dementia was sent to the ER after being diagnosed for bilateral pulmonary embolism. Patient had reported of significant weight loss recently therefore PCP had ordered CT of the chest abdomen and pelvis which showed bilateral pulmonary embolism. He had recently traveled to [place] and had a 6 hour plane ride followed by 4 hour car ride.  Assessment & Plan:   Principal Problem:   Bilateral pulmonary embolism (HCC) Active Problems:   CAD (coronary artery disease), Hx of LAD DES in 2005   Peripheral neuropathy   Hyperlipidemia with target LDL less than 70   Incidental finding of acute bilateral pulmonary embolism without cor pulmonale -Likely secondary to prolonged travel -Outpatient CT of the chest abdomen pelvis with contrast showed bilateral pulmonary embolism which are recent appearing without any evidence of right heart strain. Small lung nodules. 5.4 cm right upper quadrant retroperitoneal cyst likely benign consistent with lymphangioma. -Order 2-D echocardiogram, lower extremity duplex -Continue heparin drip. Hopefully we can transition to oral drug over next 24 hours. I have extensively discussed with the patient's wife about novel anticoagulation agents versus Coumadin and they would like normal anticoagulation agents.  Behavioral disturbances/agitation -Moderate dementia -This is likely sundowning. His agitation is excessive and persistently trying to get out of bed and leave his room despite of sitter being in his room. He is at high risk for fall at this time therefore will order fall precautions. -His QTC is normal therefore I will start him on Haldol as needed to help with his agitation,  he is deemed harmful to self and others especially while he is on heparin drip. Physically restraining him will make him further more agitated therefore will use medication with caution. Wife understands this and is helping redirect him.  Coronary artery disease status post drug-eluting stent 2005 -No longer on aspirin or Plavix due to bruising. We'll symptomatically managing  Hypertension -Continue home medications  History of peripheral neuropathy -He stopped taking Neurontin  Hyperlipidemia -Diet controlled  History of abnormal weight loss -No history of recent colonoscopy therefore he will need 1 outpatient -CT of the abdomen pelvis and chest as described above. I will also defer prostate cancer screening workup as outpatient as his PCP had recently checked his PSA.  Wife is at the bedside and I will discuss this case extensively.  DVT prophylaxis: Heparin drip Code Status: Full Family Communication:  Wife at bedside Disposition Plan: Still needs further workup as determined above  Consultants:   None  Procedures:   None  Antimicrobials:   None   Subjective: This morning patient is having excessive agitation and is demanding to be disconnected from his heparin drip and leave the hospital. He is directable at the time of my evaluation and he understands the benefit of him being in a heparin drip. But soon after I left the room patient started getting up and leaving the room despite of sitter and his wife being present in the room.  Wife tells me patient has not been in the hospital over 20 years and typically does not get agitated like this. Although she has noticed that his dementia has been progressing.  Objective: Vitals:   01/08/17 1900 01/08/17 2019 01/08/17 2300 01/09/17 1251  BP: (!) 156/93 [name])  162/97 (!) 167/89 128/76  Pulse:  97 78 87  Resp: (!) 21 20  16   Temp:  98.6 F (37 C)  98 F (36.7 C)  TempSrc:  Oral  Oral  SpO2:  99%  98%  Weight:  70.1 kg  (154 lb 8 oz)    Height:  5\' 7"  (1.702 m)      Intake/Output Summary (Last 24 hours) at 01/09/17 1307 Last data filed at 01/09/17 1048  Gross per 24 hour  Intake           568.92 ml  Output              850 ml  Net          -281.08 ml   Filed Weights   01/08/17 1722 01/08/17 2019  Weight: 71.7 kg (158 lb 2 oz) 70.1 kg (154 lb 8 oz)    Examination:  General exam: Appears calm and comfortable At the time my evaluation Respiratory system: Clear to auscultation. Respiratory effort normal. Cardiovascular system: S1 & S2 heard, RRR. No JVD, murmurs, rubs, gallops or clicks. No pedal edema. Gastrointestinal system: Abdomen is nondistended, soft and nontender. No organomegaly or masses felt. Normal bowel sounds heard. Central nervous system: Alert and oriented. No focal neurological deficits. Extremities: Symmetric 5 x 5 power. Skin: No rashes, lesions or ulcers Psychiatry: Judgement and insight appear normal. Mood & affect appropriate.     Data Reviewed:   CBC:  Recent Labs Lab 01/08/17 1659 01/08/17 1714 01/09/17 0143  WBC 8.7  --  7.7  NEUTROABS 5.6  --   --   HGB 14.7 15.6 13.4  HCT 43.7 46.0 39.8  MCV 82.5  --  81.2  PLT 160  --  527*   Basic Metabolic Panel:  Recent Labs Lab 01/08/17 1659 01/08/17 1714  NA 140 141  K 4.0 3.8  CL 104 104  CO2 24  --   GLUCOSE 116* 111*  BUN 11 10  CREATININE 1.13 1.00  CALCIUM 9.4  --    GFR: Estimated Creatinine Clearance: 49.6 mL/min (by C-G formula based on SCr of 1 mg/dL). Liver Function Tests:  Recent Labs Lab 01/08/17 1659  AST 24  ALT 14*  ALKPHOS 67  BILITOT 1.5*  PROT 8.4*  ALBUMIN 4.3   No results for input(s): LIPASE, AMYLASE in the last 168 hours. No results for input(s): AMMONIA in the last 168 hours. Coagulation Profile:  Recent Labs Lab 01/08/17 1715  INR 1.13   Cardiac Enzymes: No results for input(s): CKTOTAL, CKMB, CKMBINDEX, TROPONINI in the last 168 hours. BNP (last 3 results) No  results for input(s): PROBNP in the last 8760 hours. HbA1C: No results for input(s): HGBA1C in the last 72 hours. CBG: No results for input(s): GLUCAP in the last 168 hours. Lipid Profile: No results for input(s): CHOL, HDL, LDLCALC, TRIG, CHOLHDL, LDLDIRECT in the last 72 hours. Thyroid Function Tests: No results for input(s): TSH, T4TOTAL, FREET4, T3FREE, THYROIDAB in the last 72 hours. Anemia Panel: No results for input(s): VITAMINB12, FOLATE, FERRITIN, TIBC, IRON, RETICCTPCT in the last 72 hours. Sepsis Labs: No results for input(s): PROCALCITON, LATICACIDVEN in the last 168 hours.  No results found for this or any previous visit (from the past 240 hour(s)).       Radiology Studies: Ct Chest W Contrast  Result Date: 01/08/2017 CLINICAL DATA:  Weight loss, over 20 pounds in 4 weeks. EXAM: CT CHEST, ABDOMEN, AND PELVIS WITH CONTRAST TECHNIQUE: Multidetector CT imaging  of the chest, abdomen and pelvis was performed following the standard protocol during bolus administration of intravenous contrast. CONTRAST:  15mL ISOVUE-300 IOPAMIDOL (ISOVUE-300) INJECTION 61% COMPARISON:  Abdominal CT 05/02/2015 FINDINGS: CT CHEST FINDINGS Cardiovascular: Normal heart size. No pericardial effusion. Bilateral central pulmonary emboli branching into all lobes. These have a central reason appearance. RV to LV ratio is normal, 0.78. Atherosclerosis with bulky calcifications along the LAD. Mediastinum/Nodes: Moderate sliding hiatal hernia. Negative for adenopathy. Lungs/Pleura: Small pulmonary nodules, those visible on abdominal CT in 2016 are stable and benign. The largest newly seen nodule measures up to 4 mm average diameter. Musculoskeletal: No acute or aggressive finding. CT ABDOMEN PELVIS FINDINGS Hepatobiliary: Few small low densities in the liver are cystic appearing. No suspicious lesion.Cholelithiasis. No evidence of inflammation or duct obstruction. Pancreas: Unremarkable. Spleen: Unremarkable.  Adrenals/Urinary Tract: Negative adrenals. No hydronephrosis or stone. Mild heterogeneous cortical enhancement bilaterally on early phase, which resolves on the later phase. Two simple appearing left renal cysts. Small diverticulum or cellule at the apex of the bladder. Stomach/Bowel: Previously seen cecal wall thickening is not apparent today. Moderate sliding hiatal hernia. No obstruction or inflammatory changes. Sigmoid diverticulosis. No appendicitis. Vascular/Lymphatic: Atherosclerotic calcification of the tortuous abdominal aorta. No dissection or major branch occlusion. No asymmetry of common femoral veins. Negative for adenopathy. There is a cystic mass in the right upper quadrant retroperitoneum, between the duodenum, liver, and kidney. The mass measures up to 5.4 x 3.8 cm and has no visible septation or nodularity. Especially on coronal reformats, the mass has an overall stable appearance. Reproductive:No pathologic findings. Other: No ascites or pneumoperitoneum. Shallow fatty inguinal hernias bilaterally. Musculoskeletal: No acute abnormalities. Diffuse disc and facet degeneration with scoliosis. Multilevel lumbar spinal and foraminal narrowing. Critical Value/emergent results were called by telephone at the time of interpretation on 01/08/2017 at 9:58 am to Dr. [name] , who verbally acknowledged these results. IMPRESSION: 1. Bilateral central pulmonary emboli, recent appearing. Negative for right heart strain or lung infarct. 2. No mass, inflammation or adenopathy to explain the history of weight loss. 3. Small pulmonary nodules, those seen at the lung bases on 2016 abdominal CT are stable and benign. Previously nonvisualized nodules measure up to 4 mm. If a high risk patient, chest CT follow-up in 1 year can be considered. 4. 5.4 cm right upper quadrant retroperitoneal cyst with benign appearance, consistent with lymphangioma. 5.  Aortic Atherosclerosis (ICD10-I70.0).  Coronary atherosclerosis. 6.  Sigmoid diverticulosis. 7. Small bladder diverticulum suggesting chronic outlet obstruction. 8. Moderate hiatal hernia. 9. Cholelithiasis Electronically Signed   By: Monte Fantasia M.D.   On: 01/08/2017 10:00   Ct Abdomen Pelvis W Contrast  Result Date: 01/08/2017 CLINICAL DATA:  Weight loss, over 20 pounds in 4 weeks. EXAM: CT CHEST, ABDOMEN, AND PELVIS WITH CONTRAST TECHNIQUE: Multidetector CT imaging of the chest, abdomen and pelvis was performed following the standard protocol during bolus administration of intravenous contrast. CONTRAST:  146mL ISOVUE-300 IOPAMIDOL (ISOVUE-300) INJECTION 61% COMPARISON:  Abdominal CT 05/02/2015 FINDINGS: CT CHEST FINDINGS Cardiovascular: Normal heart size. No pericardial effusion. Bilateral central pulmonary emboli branching into all lobes. These have a central reason appearance. RV to LV ratio is normal, 0.78. Atherosclerosis with bulky calcifications along the LAD. Mediastinum/Nodes: Moderate sliding hiatal hernia. Negative for adenopathy. Lungs/Pleura: Small pulmonary nodules, those visible on abdominal CT in 2016 are stable and benign. The largest newly seen nodule measures up to 4 mm average diameter. Musculoskeletal: No acute or aggressive finding. CT ABDOMEN PELVIS FINDINGS Hepatobiliary: Few  small low densities in the liver are cystic appearing. No suspicious lesion.Cholelithiasis. No evidence of inflammation or duct obstruction. Pancreas: Unremarkable. Spleen: Unremarkable. Adrenals/Urinary Tract: Negative adrenals. No hydronephrosis or stone. Mild heterogeneous cortical enhancement bilaterally on early phase, which resolves on the later phase. Two simple appearing left renal cysts. Small diverticulum or cellule at the apex of the bladder. Stomach/Bowel: Previously seen cecal wall thickening is not apparent today. Moderate sliding hiatal hernia. No obstruction or inflammatory changes. Sigmoid diverticulosis. No appendicitis. Vascular/Lymphatic: Atherosclerotic  calcification of the tortuous abdominal aorta. No dissection or major branch occlusion. No asymmetry of common femoral veins. Negative for adenopathy. There is a cystic mass in the right upper quadrant retroperitoneum, between the duodenum, liver, and kidney. The mass measures up to 5.4 x 3.8 cm and has no visible septation or nodularity. Especially on coronal reformats, the mass has an overall stable appearance. Reproductive:No pathologic findings. Other: No ascites or pneumoperitoneum. Shallow fatty inguinal hernias bilaterally. Musculoskeletal: No acute abnormalities. Diffuse disc and facet degeneration with scoliosis. Multilevel lumbar spinal and foraminal narrowing. Critical Value/emergent results were called by telephone at the time of interpretation on 01/08/2017 at 9:58 am to Dr. [name] , who verbally acknowledged these results. IMPRESSION: 1. Bilateral central pulmonary emboli, recent appearing. Negative for right heart strain or lung infarct. 2. No mass, inflammation or adenopathy to explain the history of weight loss. 3. Small pulmonary nodules, those seen at the lung bases on 2016 abdominal CT are stable and benign. Previously nonvisualized nodules measure up to 4 mm. If a high risk patient, chest CT follow-up in 1 year can be considered. 4. 5.4 cm right upper quadrant retroperitoneal cyst with benign appearance, consistent with lymphangioma. 5.  Aortic Atherosclerosis (ICD10-I70.0).  Coronary atherosclerosis. 6. Sigmoid diverticulosis. 7. Small bladder diverticulum suggesting chronic outlet obstruction. 8. Moderate hiatal hernia. 9. Cholelithiasis Electronically Signed   By: Monte Fantasia M.D.   On: 01/08/2017 10:00        Scheduled Meds: . B-complex with vitamin C  1 tablet Oral Daily  . cholecalciferol  1,000 Units Oral Daily  . feeding supplement (ENSURE ENLIVE)  237 mL Oral BID BM  . hydrocerin   Topical BID  . multivitamin with minerals  1 tablet Oral Daily  . nebivolol  5 mg Oral  QHS  . pantoprazole  40 mg Oral Daily   Continuous Infusions: . heparin 1,100 Units/hr (01/09/17 0536)     LOS: 0 days    Time spent: 32 mins     Ankit Arsenio Loader, MD [facility] Pager [phone]   If 7PM-7AM, please contact night-coverage [email or web address] Password [email or web address] 01/09/2017, 1:07 PM

## 2017-01-09 NOTE — Progress Notes (Signed)
.  VASCULAR LAB PRELIMINARY  PRELIMINARY  PRELIMINARY  PRELIMINARY  Bilateral lower extremity venous duplex completed.    Preliminary report:  Right - Positive for an extensive acute DVT noted in the common femoral, femoral, profunda,popliteal, posterior tibial and peroneal vein. No evidence of a Baker's cyst bilaterally.   Azure, RVS 01/09/2017, 5:58 PM Entered by [place] Performed by Landry Mellow, RVT, RDMS

## 2017-01-09 NOTE — Progress Notes (Addendum)
ANTICOAGULATION CONSULT NOTE -f/u Consult  Pharmacy Consult for heparin Indication: pulmonary embolus  Allergies  Allergen Reactions  . Klonopin [Clonazepam] Other (See Comments)    Sleep walking episodes  . Ambien [Zolpidem] Other (See Comments)    Sleep walking episodes     Patient Measurements: Height: 5\' 7"  (170.2 cm) Weight: 154 lb 8 oz (70.1 kg) IBW/kg (Calculated) : 66.1 Heparin Dosing Weight: using total body weight of 71.7 kg documented by ED  Vital Signs: Temp: 98.6 F (37 C) (08/07 2019) Temp Source: Oral (08/07 2019) BP: 167/89 (08/07 2300) Pulse Rate: 78 (08/07 2300)  Labs:  Recent Labs  01/08/17 1659 01/08/17 1714 01/08/17 1715 01/09/17 0143  HGB 14.7 15.6  --  13.4  HCT 43.7 46.0  --  39.8  PLT 160  --   --  136*  APTT  --   --  37*  --   LABPROT  --   --  14.5  --   INR  --   --  1.13  --   HEPARINUNFRC  --   --   --  0.62  CREATININE 1.13 1.00  --   --     Estimated Creatinine Clearance: 49.6 mL/min (by C-G formula based on SCr of 1 mg/dL).   Medical History: Past Medical History:  Diagnosis Date  . Abnormality of gait 11/05/2013  . CAD (coronary artery disease) 2005   stent to LAD by Dr. Glade Lloyd. mild irregulatities of RCA.    [name] CRF (chronic renal failure)   . ED (erectile dysfunction)   . H/O exercise stress test 05/2012   no statistically significant ischemia.   [name] History of TIA (transient ischemic attack)    transient confusion  . HOH (hard of hearing)    hearing aids  . Hyperlipidemia LDL goal < 70   . Hypertension   . Insomnia   . Macular degeneration    bilateral  . Macular degeneration    Right>left  . NSTEMI (non-ST elevated myocardial infarction) ([place]) 04/16/2013  . OSA (obstructive sleep apnea)    mild, never on cpap  . Peripheral neuropathy   . Vitamin D deficiency     Assessment: 68 yoM presenting with significant weight loss. CT chest revealing bilateral central pulmonary emboli, recent appearing. Negative for  right heart strain or lung infarct.  Will be started on heparin infusion.    Not on anticoagulants documented PTA. Baseline Hgb and platelets WNL Baseline anticoag labs pending Today, 8/8 0143 HL=0.62 at goal, no infusion or bleeding issues per RN  Goal of Therapy:  Heparin level 0.3-0.7 units/ml Monitor platelets by anticoagulation protocol: Yes   Plan:  Continue heparin drip at 1100 units/hr Recheck confirmatory HL in 8 hours Daily heparin level and CBC while on heparin infusion.  Lawana Pai R 01/09/2017,2:39 AM  Addendum: [phone] RN called pt pulled heparin line out.  Was out for ~ 30 mins. Will rebolus with 2000 units and restart at current rate. Recheck HL 8 hours from restart  Thanks Dorrene German 01/09/2017 5:41 AM

## 2017-01-09 NOTE — Progress Notes (Signed)
ANTICOAGULATION CONSULT NOTE -f/u Consult  Pharmacy Consult for heparin Indication: pulmonary embolus  Allergies  Allergen Reactions  . Klonopin [Clonazepam] Other (See Comments)    Sleep walking episodes  . Ambien [Zolpidem] Other (See Comments)    Sleep walking episodes     Patient Measurements: Height: 5\' 7"  (170.2 cm) Weight: 154 lb 8 oz (70.1 kg) IBW/kg (Calculated) : 66.1 Heparin Dosing Weight: using total body weight of 71.7 kg documented by ED  Vital Signs: Temp: 98 F (36.7 C) (08/08 1251) Temp Source: Oral (08/08 1251) BP: 128/76 (08/08 1251) Pulse Rate: 87 (08/08 1251)  Labs:  Recent Labs  01/08/17 1659 01/08/17 1714 01/08/17 1715 01/09/17 0143 01/09/17 1532  HGB 14.7 15.6  --  13.4  --   HCT 43.7 46.0  --  39.8  --   PLT 160  --   --  136*  --   APTT  --   --  37*  --   --   LABPROT  --   --  14.5  --   --   INR  --   --  1.13  --   --   HEPARINUNFRC  --   --   --  0.62 0.47  CREATININE 1.13 1.00  --   --   --     Estimated Creatinine Clearance: 49.6 mL/min (by C-G formula based on SCr of 1 mg/dL).   Medical History: Past Medical History:  Diagnosis Date  . Abnormality of gait 11/05/2013  . CAD (coronary artery disease) 2005   stent to LAD by Dr. Glade Lloyd. mild irregulatities of RCA.    [name] CRF (chronic renal failure)   . ED (erectile dysfunction)   . H/O exercise stress test 05/2012   no statistically significant ischemia.   [name] History of TIA (transient ischemic attack)    transient confusion  . HOH (hard of hearing)    hearing aids  . Hyperlipidemia LDL goal < 70   . Hypertension   . Insomnia   . Macular degeneration    bilateral  . Macular degeneration    Right>left  . NSTEMI (non-ST elevated myocardial infarction) ([place]) 04/16/2013  . OSA (obstructive sleep apnea)    mild, never on cpap  . Peripheral neuropathy   . Vitamin D deficiency     Assessment: 64 yoM presenting with significant weight loss. CT chest revealing bilateral  central pulmonary emboli, recent appearing. Negative for right heart strain or lung infarct.  Will be started on heparin infusion.  Not on anticoagulants PTA.  Today, 01/09/2017: - Repeat heparin level therapeutic at 0.47 units/ml with infusion at 1100 units/hr - Hgb 13.4, platelets 136K - both with slight decrease from admission. No bleeding reported.   Goal of Therapy:  Heparin level 0.3-0.7 units/ml Monitor platelets by anticoagulation protocol: Yes   Plan:  Continue heparin drip at 1100 units/hr Daily heparin level and CBC while on heparin infusion.  Hershal Coria 01/09/2017,5:24 PM

## 2017-01-10 ENCOUNTER — Inpatient Hospital Stay (HOSPITAL_COMMUNITY): Payer: Medicare Other

## 2017-01-10 ENCOUNTER — Other Ambulatory Visit (HOSPITAL_COMMUNITY): Payer: Medicare Other

## 2017-01-10 DIAGNOSIS — M542 Cervicalgia: Secondary | ICD-10-CM

## 2017-01-10 DIAGNOSIS — I2699 Other pulmonary embolism without acute cor pulmonale: Principal | ICD-10-CM

## 2017-01-10 DIAGNOSIS — I824Y9 Acute embolism and thrombosis of unspecified deep veins of unspecified proximal lower extremity: Secondary | ICD-10-CM

## 2017-01-10 LAB — CBC
HCT: 42.1 % (ref 39.0–52.0)
HEMOGLOBIN: 14 g/dL (ref 13.0–17.0)
MCH: 27.3 pg (ref 26.0–34.0)
MCHC: 33.3 g/dL (ref 30.0–36.0)
MCV: 82.1 fL (ref 78.0–100.0)
Platelets: 157 10*3/uL (ref 150–400)
RBC: 5.13 MIL/uL (ref 4.22–5.81)
RDW: 17.3 % — AB (ref 11.5–15.5)
WBC: 8.9 10*3/uL (ref 4.0–10.5)

## 2017-01-10 LAB — HEPARIN LEVEL (UNFRACTIONATED): HEPARIN UNFRACTIONATED: 0.42 [IU]/mL (ref 0.30–0.70)

## 2017-01-10 LAB — ECHOCARDIOGRAM COMPLETE
HEIGHTINCHES: 67 in
Weight: 2472 oz

## 2017-01-10 MED ORDER — LORAZEPAM 2 MG/ML IJ SOLN
1.0000 mg | Freq: Once | INTRAMUSCULAR | Status: AC
  Administered 2017-01-10: 1 mg via INTRAVENOUS
  Filled 2017-01-10: qty 1

## 2017-01-10 MED ORDER — LORAZEPAM 2 MG/ML IJ SOLN
0.5000 mg | Freq: Once | INTRAMUSCULAR | Status: AC
  Administered 2017-01-10: 0.5 mg via INTRAVENOUS
  Filled 2017-01-10: qty 1

## 2017-01-10 MED ORDER — RIVAROXABAN 15 MG PO TABS
15.0000 mg | ORAL_TABLET | Freq: Two times a day (BID) | ORAL | Status: DC
  Administered 2017-01-10 – 2017-01-11 (×2): 15 mg via ORAL
  Filled 2017-01-10 (×3): qty 1

## 2017-01-10 MED ORDER — HALOPERIDOL LACTATE 5 MG/ML IJ SOLN
2.0000 mg | Freq: Once | INTRAMUSCULAR | Status: AC
  Administered 2017-01-10: 2 mg via INTRAVENOUS
  Filled 2017-01-10: qty 1

## 2017-01-10 NOTE — Care Management Note (Signed)
Case Management Note  Patient Details  Name: Corey Larson. MRN: [id number] Date of Birth: [birth year]  Subjective/Objective: PT/OT-recc HHC. [facility] chosen by [facility] rep karen aware & accepted.HHPT/HHOT ordered. Await d/c order.                    Action/Plan:d/c home w/HHC.   Expected Discharge Date:   (unknown)               Expected Discharge Plan:  [place]  In-House Referral:     Discharge planning Services  CM Consult  Post Acute Care Choice:    Choice offered to:  Spouse  DME Arranged:    DME Agency:     HH Arranged:  PT, OT HH Agency:  [facility]  Status of Service:  In process, will continue to follow  If discussed at Long Length of Stay Meetings, dates discussed:    Additional Comments:  Dessa Phi, RN 01/10/2017, 12:15 PM

## 2017-01-10 NOTE — Progress Notes (Signed)
  Echocardiogram 2D Echocardiogram has been performed.  Matilde Bash 01/10/2017, 12:20 PM

## 2017-01-10 NOTE — Progress Notes (Addendum)
ANTICOAGULATION CONSULT NOTE -f/u Consult  Pharmacy Consult for heparin to rivaroxaban Indication: pulmonary embolus  Allergies  Allergen Reactions  . Klonopin [Clonazepam] Other (See Comments)    Sleep walking episodes  . Ambien [Zolpidem] Other (See Comments)    Sleep walking episodes     Patient Measurements: Height: 5\' 7"  (170.2 cm) Weight: 154 lb 8 oz (70.1 kg) IBW/kg (Calculated) : 66.1   Vital Signs: Temp: 98.8 F (37.1 C) (08/08 2050) Temp Source: Oral (08/08 2050) BP: 126/86 (08/08 2050) Pulse Rate: 88 (08/08 2050)  Labs:  Recent Labs  01/08/17 1659 01/08/17 1714 01/08/17 1715 01/09/17 0143 01/09/17 1532 01/10/17 0439  HGB 14.7 15.6  --  13.4  --  14.0  HCT 43.7 46.0  --  39.8  --  42.1  PLT 160  --   --  136*  --  157  APTT  --   --  37*  --   --   --   LABPROT  --   --  14.5  --   --   --   INR  --   --  1.13  --   --   --   HEPARINUNFRC  --   --   --  0.62 0.47 0.42  CREATININE 1.13 1.00  --   --   --   --     Estimated Creatinine Clearance: 49.6 mL/min (by C-G formula based on SCr of 1 mg/dL).   Assessment: 72 yoM presenting with significant weight loss. CT chest revealing bilateral central pulmonary emboli, recent- appearing. Negative for right heart strain or lung infarct. Started on heparin infusion.  Not on anticoagulants PTA.  Today, 01/10/2017: - Heparin level therapeutic, stable on 1100 units/hr - Hgb and pltc WNL - Nursing notes describe significant agitation and combativeness overnight but no bleeding reported  Addendum:   Orders to convert to PO rivaroxaban  Goal of Therapy:  Heparin level 0.3-0.7 units/ml Monitor platelets by anticoagulation protocol: Yes   Plan:   rivaroxaban 15mg  BIDwc x 21 days then 20mg  daily with meal  First dose due when stop heparin gtt  D/c heparin labs  Xarelto education and coupon card to be provided by pharmacist prior to discharge  Prescribe xarelto starter pack at discharge  Monitor for  bleeding  Doreene Eland, PharmD, BCPS.   Pager: [phone] 01/10/2017 4:17 PM

## 2017-01-10 NOTE — Progress Notes (Signed)
PT Cancellation Note  Patient Details Name: Corey Larson. MRN: [id number] DOB: [birth year]   Cancelled Treatment:    Reason Eval/Treat Not Completed: Patient at procedure or test/unavailable   Leilynn Pilat,KATHrine E 01/10/2017, 2:36 PM Carmelia Bake, PT, DPT 01/10/2017 Pager: [phone]

## 2017-01-10 NOTE — Progress Notes (Addendum)
PROGRESS NOTE    Corey Larson.  GXQ:[id number] DOB: 09-24-1927 DOA: 01/08/2017 PCP: Thressa Sheller, MD   Brief Narrative:   83 year old male with history of coronary artery disease status post drug-eluting stent in LAD in 2005, peripheral neuropathy, hypertension, hyperlipidemia and dementia was sent to the ER after being diagnosed for bilateral pulmonary embolism. Patient had reported of significant weight loss recently therefore PCP had ordered CT of the chest abdomen and pelvis which showed bilateral pulmonary embolism. He had recently traveled to [place] and had a 6 hour plane ride followed by 4 hour car ride.  Assessment & Plan:   Principal Problem:   Bilateral pulmonary embolism (HCC) Active Problems:   CAD (coronary artery disease), Hx of LAD DES in 2005   Peripheral neuropathy   Hyperlipidemia with target LDL less than 67   Mother embolism/acute DVT -Likely secondary to prolonged travel -Outpatient CT of the chest abdomen pelvis with contrast showed bilateral pulmonary embolism which are recent appearing without any evidence of right heart strain. Small lung nodules. 5.4 cm right upper quadrant retroperitoneal cyst likely benign consistent with lymphangioma. -2-D echo with no evidence of right heart strain - Venous Doppler significant for right lower extremity DVT -On heparin GTT today, will transition to Xarelto .  Acute delirium - Patient with baseline mild dementia, per wife every time patient comes to the hospital he has sundowning and delirium, which unfortunately having this time as well, he was started on Haldol, despite that still significant agitation and poor night sleep.  Coronary artery disease status post drug-eluting stent 2005 -No longer on aspirin or Plavix due to bruising. We'll symptomatically managing  Hypertension -Continue home medications  History of peripheral neuropathy -He stopped taking Neurontin  Hyperlipidemia -Diet  controlled  Neck pain - Chest x-ray with no acute findings, physical exam is benign  History of abnormal weight loss -No history of recent colonoscopy therefore he will need 1 outpatient -CT of the abdomen pelvis and chest as described above. I will also defer prostate cancer screening workup as outpatient as his PCP had recently checked his PSA.  DVT prophylaxis: Heparin drip>>Xarelto Code Status: Full Family Communication:  Wife at bedside Disposition Plan: Home when stable, still with significant delirium  Consultants:   None  Procedures:   None  Antimicrobials:   None   Subjective: patient with significant delirium overnight, poor night sleep, he is afebrile, currently denies any chest pain, shortness of breath, .   Objective: Vitals:   01/09/17 1251 01/09/17 2050 01/10/17 0309 01/10/17 0651  BP: 128/76 126/86  123/76  Pulse: 87 88  78  Resp: 16 18  18   Temp: 98 F (36.7 C) 98.8 F (37.1 C)  98.7 F (37.1 C)  TempSrc: Oral Oral  Oral  SpO2: 98% 97% 97% 98%  Weight:      Height:        Intake/Output Summary (Last 24 hours) at 01/10/17 1557 Last data filed at 01/10/17 1300  Gross per 24 hour  Intake          1020.85 ml  Output              100 ml  Net           920.85 ml   Filed Weights   01/08/17 1722 01/08/17 2019  Weight: 71.7 kg (158 lb 2 oz) 70.1 kg (154 lb 8 oz)    Examination:  General exam: Elderly male, laying in bed, mildly restless, confused  Respiratory system: Codominant bilaterally, clear to auscultation Cardiovascular system: S1 & S2 heard, regular rate and rhythm, murmur gallop Gastrointestinal system: Abdomen is soft, nontender, nondistended, bowel sounds present  Central nervous system: Alert and oriented. No focal neurological deficits. Extremities: Symmetric 5 x 5 power. Skin: No rashes, lesions or ulcers Psychiatry: Confused, restless    Data Reviewed:   CBC:  Recent Labs Lab 01/08/17 1659 01/08/17 1714  01/09/17 0143 01/10/17 0439  WBC 8.7  --  7.7 8.9  NEUTROABS 5.6  --   --   --   HGB 14.7 15.6 13.4 14.0  HCT 43.7 46.0 39.8 42.1  MCV 82.5  --  81.2 82.1  PLT 160  --  136* 469   Basic Metabolic Panel:  Recent Labs Lab 01/08/17 1659 01/08/17 1714  NA 140 141  K 4.0 3.8  CL 104 104  CO2 24  --   GLUCOSE 116* 111*  BUN 11 10  CREATININE 1.13 1.00  CALCIUM 9.4  --    GFR: Estimated Creatinine Clearance: 49.6 mL/min (by C-G formula based on SCr of 1 mg/dL). Liver Function Tests:  Recent Labs Lab 01/08/17 1659  AST 24  ALT 14*  ALKPHOS 67  BILITOT 1.5*  PROT 8.4*  ALBUMIN 4.3   No results for input(s): LIPASE, AMYLASE in the last 168 hours. No results for input(s): AMMONIA in the last 168 hours. Coagulation Profile:  Recent Labs Lab 01/08/17 1715  INR 1.13   Cardiac Enzymes: No results for input(s): CKTOTAL, CKMB, CKMBINDEX, TROPONINI in the last 168 hours. BNP (last 3 results) No results for input(s): PROBNP in the last 8760 hours. HbA1C: No results for input(s): HGBA1C in the last 72 hours. CBG: No results for input(s): GLUCAP in the last 168 hours. Lipid Profile: No results for input(s): CHOL, HDL, LDLCALC, TRIG, CHOLHDL, LDLDIRECT in the last 72 hours. Thyroid Function Tests: No results for input(s): TSH, T4TOTAL, FREET4, T3FREE, THYROIDAB in the last 72 hours. Anemia Panel: No results for input(s): VITAMINB12, FOLATE, FERRITIN, TIBC, IRON, RETICCTPCT in the last 72 hours. Sepsis Labs: No results for input(s): PROCALCITON, LATICACIDVEN in the last 168 hours.  No results found for this or any previous visit (from the past 240 hour(s)).       Radiology Studies: Dg Cervical Spine 2 Or 3 Views  Result Date: 01/10/2017 CLINICAL DATA:  Right-sided neck pain. Please note, the patient could not tolerate optimal positioning. The best possible images were obtained. EXAM: CERVICAL SPINE - 2-3 VIEW COMPARISON:  None. FINDINGS: There is no evidence of  cervical spine fracture or prevertebral soft tissue swelling. Alignment is normal. Moderate degenerative changes are noted. No other significant bone abnormalities are identified. IMPRESSION: Limited evaluation secondary to patient positioning. Within these limitations, no acute fracture or prevertebral soft tissue swelling is identified. If there is a history of trauma raising the concern for fracture, noncontrast CT of the C-spine would be the study of choice. Electronically Signed   By: Kristopher Oppenheim M.D.   On: 01/10/2017 15:14        Scheduled Meds: . B-complex with vitamin C  1 tablet Oral Daily  . cholecalciferol  1,000 Units Oral Daily  . feeding supplement (ENSURE ENLIVE)  237 mL Oral BID BM  . hydrocerin   Topical BID  . multivitamin with minerals  1 tablet Oral Daily  . nebivolol  5 mg Oral QHS  . pantoprazole  40 mg Oral Daily   Continuous Infusions: . heparin 1,100 Units/hr (01/10/17  0246)     LOS: 1 day      Phillips Climes, MD [facility] Pager [phone]  If 7PM-7AM, please contact night-coverage [email or web address] Password [email or web address] 01/10/2017, 3:57 PM

## 2017-01-10 NOTE — Progress Notes (Signed)
Pt has become increasingly combative towards nursing staff. Pt kicked Air cabin crew and is continuously trying to climb out of bed. He is unsteady on his feet. Staff is afraid pt is going to fall. Pt is not following commands, on call MD Hal Hope made aware. Verbal order for 2mg  IV haldol given. Will administer and continue to monitor pt.

## 2017-01-10 NOTE — Evaluation (Signed)
Occupational Therapy Evaluation Patient Details Name: Corey Larson. MRN: [id number] DOB: 02/01/1928 Today's Date: 01/10/2017    History of Present Illness 83 year old male with history of coronary artery disease status post drug-eluting stent in LAD in 2005, peripheral neuropathy, hypertension, hyperlipidemia and dementia and admitted for bilateral pulmonary embolism.   Clinical Impression   Pt was admitted for the above.  At baseline, he is independent with adls, transfers and travels with his wife.  He was limited by arousal during the evaluation.  Also note, pt with neck pain when rolling.  Pt did get up and ambulated with PT yesterday. Will follow in acute setting with supervision to min guard level goals.  Pt needs mostly max A at this time due to arousal    Follow Up Recommendations  Home health OT;Supervision/Assistance - 24 hour (as long as family can manage)    Equipment Recommendations   (to be further assessed)    Recommendations for Other Services       Precautions / Restrictions Precautions Precautions: Fall Precaution Comments: bil mitts due to pulling on IV Restrictions Weight Bearing Restrictions: No      Mobility Bed Mobility Overal bed mobility: Needs Assistance Bed Mobility: Rolling Rolling: Mod assist         General bed mobility comments: assist to roll to bil sides  Transfers Overall transfer level: Needs assistance Equipment used: None Transfers: Sit to/from Stand Sit to Stand: Min guard         General transfer comment: did not get up with OT.  Stood with PT with min guard assist    Balance Overall balance assessment: Needs assistance         Standing balance support: No upper extremity supported Standing balance-Leahy Scale: Fair                             ADL either performed or assessed with clinical judgement   ADL Overall ADL's : Needs assistance/impaired     Grooming: Minimal assistance;Sitting            Upper Body Dressing : Maximal assistance;Bed level                     General ADL Comments: pt needs mostly max A for adls at this time due to variable arousal.  Per friend, pt was awake a lot of last night  and restless. Assisted nursing with changing sheets; pt did help with rolling.  Removed only R mitt as pt then fell deeply asleep.  He was trying to remove mitts when awake     Vision         Perception     Praxis      Pertinent Vitals/Pain Pain Assessment: Faces Faces Pain Scale: Hurts whole lot Pain Location: neck with rolling Pain Descriptors / Indicators: Grimacing Pain Intervention(s): Limited activity within patient's tolerance;Monitored during session;Repositioned     Hand Dominance     Extremity/Trunk Assessment Upper Extremity Assessment Upper Extremity Assessment: Overall WFL for tasks assessed      Cervical / Trunk Assessment Cervical / Trunk Assessment: Kyphotic   Communication Communication Communication:  (difficult to understand at times)   Cognition Arousal/Alertness:  (variable:  awake then sleeping) Behavior During Therapy:  (restless then asleep)  General Comments: followed some but not all commands.  H/O dementia, but functions well at baseline per friend   General Comments       Exercises     Shoulder Instructions      Home Living Family/patient expects to be discharged to:: Private residence Living Arrangements: Spouse/significant other   Type of Home: House Home Access: Stairs to enter     Home Layout: Two level               Home Equipment: Environmental consultant - 2 wheels;Cane - single point          Prior Functioning/Environment Level of Independence: Independent        Comments: was traveling recently with wife        OT Problem List: Decreased strength;Decreased activity tolerance;Decreased cognition;Decreased safety awareness;Pain      OT  Treatment/Interventions: Self-care/ADL training;DME and/or AE instruction;Patient/family education    OT Goals(Current goals can be found in the care plan section) Acute Rehab OT Goals Patient Stated Goal: pt unable to state OT Goal Formulation: With patient Time For Goal Achievement: 01/17/17 Potential to Achieve Goals: Fair ADL Goals Pt Will Perform Grooming: with min guard assist;standing Pt Will Perform Upper Body Bathing: with supervision;sitting Pt Will Perform Lower Body Bathing: with min guard assist;sit to/from stand Pt Will Perform Upper Body Dressing: with supervision;sitting Pt Will Perform Lower Body Dressing: with min guard assist;sit to/from stand Pt Will Transfer to Toilet: with min guard assist;ambulating;regular height toilet;bedside commode (vs) Pt Will Perform Toileting - Clothing Manipulation and hygiene: with min guard assist;sit to/from stand  OT Frequency: Min 2X/week   Barriers to D/C:            Co-evaluation              AM-PAC PT "6 Clicks" Daily Activity     Outcome Measure Help from another person eating meals?: A Little Help from another person taking care of personal grooming?: A Little Help from another person toileting, which includes using toliet, bedpan, or urinal?: A Lot Help from another person bathing (including washing, rinsing, drying)?: A Lot Help from another person to put on and taking off regular upper body clothing?: A Lot Help from another person to put on and taking off regular lower body clothing?: A Lot 6 Click Score: 14   End of Session    Activity Tolerance: Patient limited by lethargy;Patient limited by fatigue Patient left: in bed;with call bell/phone within reach;with family/visitor present;with bed alarm set  OT Visit Diagnosis: Muscle weakness (generalized) (M62.81)                Time: 0812-0825 OT Time Calculation (min): 13 min Charges:  OT General Charges $OT Visit: 1 Procedure OT Evaluation $OT Eval  Moderate Complexity: 1 Procedure G-Codes:     Odell, OTR/L [phone] 01/10/2017  Richie Vadala 01/10/2017, 10:35 AM

## 2017-01-10 NOTE — Progress Notes (Signed)
Dr. Hal Hope up to assess pt at this time and verbally ordered 0.5mg  IV ativan. If further diversion activities and medication are not effective, will page MD. Will continue to monitor pt.

## 2017-01-10 NOTE — Progress Notes (Signed)
Pt. Given 2 mg IV haldol. Pt. Was calm for about 20 minutes and then began to get combative again. Pt. Attempting to get out of bed to "put my clothes on and go to the doctor." pt. Was educated and attempted to reorient. Unsuccessful with reorientation along with diversion attempts. Concern for pt's safety has increased especially since pt. Is very unsteady and is on a heparin drip. On call MD [facility] paged and made aware. Verbal orders given for 1 mg IV ativan and MD stated he will be up to see the patient soon. 2 RN's and NT's currently present in the room.

## 2017-01-10 NOTE — Evaluation (Signed)
Physical Therapy Evaluation Patient Details Name: Corey Larson. MRN: [id number] DOB: [birth year] Today's Date: 01/10/2017   History of Present Illness  83 year old male with history of coronary artery disease status post drug-eluting stent in LAD in 2005, peripheral neuropathy, hypertension, hyperlipidemia and dementia and admitted for bilateral pulmonary embolism.  Clinical Impression  Pt admitted with above diagnosis. Pt currently with functional limitations due to the deficits listed below (see PT Problem List).  Pt will benefit from skilled PT to increase their independence and safety with mobility to allow discharge to the venue listed below.  Per pt's sitter, pt has been ambulating around room most of today however uncooperative to ambulate in hallway at time of evaluation.  Pt ambulated around room and SPO2 checked (see below).        Follow Up Recommendations Home health PT;Supervision/Assistance - 24 hour    Equipment Recommendations  None recommended by PT    Recommendations for Other Services       Precautions / Restrictions Precautions Precautions: Fall Precaution Comments:  Restrictions Weight Bearing Restrictions: No      Mobility  Bed Mobility               General bed mobility comments: pt sitting on window seat upon entering room with spouse and sitter present  Transfers Overall transfer level: Needs assistance Equipment used: None Transfers: Sit to/from Stand Sit to Stand: Min guard         General transfer comment: requires use of UE for assist, min/guard for safety  Ambulation/Gait Ambulation/Gait assistance: Min guard Ambulation Distance (Feet): 9 Feet Assistive device: None Gait Pattern/deviations: Step-through pattern;Decreased stride length;Trunk flexed     General Gait Details: pt refused assistance and RW, pt holding onto room furniture and IV pole for UE support, pt would only ambulate over to recliner and then back to window  seat, SpO2 on room air during ambulating 98%  Stairs            Wheelchair Mobility    Modified Rankin (Stroke Patients Only)       Balance Overall balance assessment: Needs assistance         Standing balance support: No upper extremity supported Standing balance-Leahy Scale: Fair                               Pertinent Vitals/Pain Pain Assessment: Faces Faces Pain Scale: Hurts a little bit Pain Location: neck with rolling Pain Descriptors / Indicators: Grimacing Pain Intervention(s): Monitored during session    Home Living Family/patient expects to be discharged to:: Private residence Living Arrangements: Spouse/significant other   Type of Home: House Home Access: Stairs to enter     Home Layout: Two level Home Equipment: Environmental consultant - 2 wheels;Cane - single point      Prior Function Level of Independence: Independent               Hand Dominance        Extremity/Trunk Assessment        Lower Extremity Assessment Lower Extremity Assessment: Overall WFL for tasks assessed    Cervical / Trunk Assessment Cervical / Trunk Assessment: Kyphotic  Communication   Communication: No difficulties  Cognition Arousal/Alertness: Awake/alert Behavior During Therapy: Restless  General Comments: pt with decreased cognition acutely, spouse reports he does not behave this way at home, pt resistant to ambulating and only would ambulate around room      General Comments      Exercises     Assessment/Plan    PT Assessment Patient needs continued PT services  PT Problem List Decreased activity tolerance;Decreased balance;Decreased cognition;Decreased safety awareness;Decreased mobility       PT Treatment Interventions DME instruction;Gait training;Therapeutic exercise;Therapeutic activities;Patient/family education;Functional mobility training;Balance training    PT Goals (Current goals can  be found in the Care Plan section)  Acute Rehab PT Goals PT Goal Formulation: With patient/family Time For Goal Achievement: 01/24/17 Potential to Achieve Goals: Fair    Frequency Min 3X/week   Barriers to discharge        Co-evaluation               AM-PAC PT "6 Clicks" Daily Activity  Outcome Measure Difficulty turning over in bed (including adjusting bedclothes, sheets and blankets)?: None Difficulty moving from lying on back to sitting on the side of the bed? : None Difficulty sitting down on and standing up from a chair with arms (e.g., wheelchair, bedside commode, etc,.)?: A Lot Help needed moving to and from a bed to chair (including a wheelchair)?: A Little Help needed walking in hospital room?: A Little Help needed climbing 3-5 steps with a railing? : A Lot 6 Click Score: 18    End of Session Equipment Utilized During Treatment: Gait belt Activity Tolerance: Treatment limited secondary to agitation Patient left: in chair;with nursing/sitter in room;with family/visitor present   PT Visit Diagnosis: Difficulty in walking, not elsewhere classified (R26.2)    Time: 1355-1410 PT Time Calculation (min) (ACUTE ONLY): 15 min   Charges:         PT G CodesCarmelia Bake, PT, DPT 01/10/2017 Pager: [phone]'   Blythe Hartshorn,KATHrine E 01/10/2017, 8:37 AM

## 2017-01-11 LAB — CBC
HCT: 39.9 % (ref 39.0–52.0)
Hemoglobin: 13.4 g/dL (ref 13.0–17.0)
MCH: 27.7 pg (ref 26.0–34.0)
MCHC: 33.6 g/dL (ref 30.0–36.0)
MCV: 82.6 fL (ref 78.0–100.0)
PLATELETS: 183 10*3/uL (ref 150–400)
RBC: 4.83 MIL/uL (ref 4.22–5.81)
RDW: 17.1 % — AB (ref 11.5–15.5)
WBC: 8.6 10*3/uL (ref 4.0–10.5)

## 2017-01-11 MED ORDER — RIVAROXABAN (XARELTO) VTE STARTER PACK (15 & 20 MG): ORAL_TABLET | ORAL | 0 refills | Status: DC

## 2017-01-11 NOTE — Discharge Instructions (Addendum)

## 2017-01-11 NOTE — Care Management Note (Signed)
Case Management Note  Patient Details  Name: Corey Larson. MRN: [id number] Date of Birth: 04-01-1928  Subjective/Objective:  Spouse agree to [facility] aide also-[facility] rep Corey Larson aware of d/c today w/HHC. No further CM needs.                  Action/Plan:d/c home w/HHC.   Expected Discharge Date:   (unknown)               Expected Discharge Plan:  [place]  In-House Referral:     Discharge planning Services  CM Consult  Post Acute Care Choice:    Choice offered to:  Spouse  DME Arranged:    DME Agency:     HH Arranged:  PT, OT, Nurse's Aide [place] Agency:  [place]  Status of Service:  Completed, signed off  If discussed at [place] of Stay Meetings, dates discussed:    Additional Comments:  Dessa Phi, RN 01/11/2017, 10:44 AM

## 2017-01-11 NOTE — Discharge Summary (Signed)
Frantz Quattrone., is a 83 y.o. male  DOB 01-13-1928  MRN [id number].  Admission date:  01/08/2017  Admitting Physician  Venetia Maxon Rama, MD  Discharge Date:  01/11/2017   Primary MD  Thressa Sheller, MD  Recommendations for primary care physician for things to follow:  - Please check CBC, BMP  next visit - Patient will need repeat CT chest in one year for follow-up on incidental finding of pulmonary nodule   Admission Diagnosis  SOB; Sent from dr   Discharge Diagnosis  SOB; Sent from dr    Principal Problem:   Bilateral pulmonary embolism (HCC) Active Problems:   CAD (coronary artery disease), Hx of LAD DES in 2005   Peripheral neuropathy   Hyperlipidemia with target LDL less than 70   Neck pain   Acute deep vein thrombosis (DVT) of proximal vein of lower extremity (HCC)      Past Medical History:  Diagnosis Date  . Abnormality of gait 11/05/2013  . CAD (coronary artery disease) 2005   stent to LAD by Dr. Glade Lloyd. mild irregulatities of RCA.    [name] CRF (chronic renal failure)   . ED (erectile dysfunction)   . H/O exercise stress test 05/2012   no statistically significant ischemia.   [name] History of TIA (transient ischemic attack)    transient confusion  . HOH (hard of hearing)    hearing aids  . Hyperlipidemia LDL goal < 70   . Hypertension   . Insomnia   . Macular degeneration    bilateral  . Macular degeneration    Right>left  . NSTEMI (non-ST elevated myocardial infarction) ([place]) 04/16/2013  . OSA (obstructive sleep apnea)    mild, never on cpap  . Peripheral neuropathy   . Vitamin D deficiency     Past Surgical History:  Procedure Laterality Date  . ANKLE SURGERY     right ankle 2002 fracture  . BACK SURGERY  1976   laminectomy  . CARDIAC CATHETERIZATION     2005, stent placed DES LAD  . CATARACT EXTRACTION     bilateral  . CORONARY ANGIOPLASTY    . HERNIA REPAIR   1981   "double hernia"  . KNEE SURGERY     right knee 2011   . LEFT HEART CATHETERIZATION WITH CORONARY ANGIOGRAM N/A 04/16/2013   Procedure: LEFT HEART CATHETERIZATION WITH CORONARY ANGIOGRAM;  Surgeon: Leonie Man, MD;  Location: [facility] CATH LAB;  Service: Cardiovascular;  Laterality: N/A;  . NM MYOCAR MULTIPLE W/SPECT  05/13/2012   MILD UPPER SEPTAL THINNING AND DIAPHRAGMATIC ATTENUATION W/O SIGNIFICANT ISCHEMIA. EF 66%.  . TRANSTHORACIC ECHOCARDIOGRAM  05/12/2010   EF =>55%. MILD CONCENTRIC LVH. TRACE MITRAL REGURG.       History of present illness and  Hospital Course:     Kindly see H&P for history of present illness and admission details, please review complete Labs, Consult reports and Test reports for all details in brief  HPI  from the history and physical done on the day of  admission  Dina Mobley. is an 83 y.o. male with a PMH of CAD status post DES to the LAD in 2005 who discontinued Plavix many years ago, hyperlipidemia, peripheral neuropathy and hypertension who is being evaluated by his PCP for abnormal weight loss and in the course of his workup, underwent CT scanning of his chest, abdomen and pelvis today which showed bilateral pulmonary emboli. The patient denies significant dyspnea although his wife insists that he has had some shortness of breath. He denies chest pain. He recently traveled to [place] for 6 hours on a plane and this was followed by a 4 hour car ride. He has no prior history of blood clots, and no family history of blood clots.   Hospital Course   83 year old male with history of coronary artery disease status post drug-eluting stent in LAD in 2005, peripheral neuropathy, hypertension, hyperlipidemia and dementia was sent to the ER after being diagnosed for bilateral pulmonary embolism. Patient had reported of significant weight loss recently therefore PCP had ordered CT of the chest abdomen and pelvis which showed bilateral pulmonary embolism.  He had recently traveled to [place] and had a 6 hour plane ride followed by 4 hour car ride.  Pulmonary embolism/acute DVT -Likely secondary to prolonged travel -Outpatient CT of the chest abdomen pelvis with contrast showed bilateral pulmonary embolism which are recent appearing without any evidence of right heart strain. Small lung nodules. 5.4 cm right upper quadrant retroperitoneal cyst likely benign consistent with lymphangioma. -2-D echo with no evidence of right heart strain - Venous Doppler significant for extensive acute right lower extremity DVT -Heparin GTT for first 24 hour, transitioned to settle on discharge  Acute delirium - Patient with baseline mild dementia, per wife every time patient comes to the hospital he has sundowning and delirium, which unfortunately having this time as well, his mentation has improved this a.m., not back to baseline yet, discussed with wife, she was to take him home, his previous hospital stay significant for prolonged delirium, and it resolves quickly when he gets home .  Coronary artery disease status post drug-eluting stent 2005 -No longer on aspirin or Plavix due to bruising.  Hypertension -Continue home medications  History of peripheral neuropathy -He stopped taking Neurontin  Hyperlipidemia -Diet controlled  Neck pain - Chest x-ray with no acute findings, physical exam is benign  History of abnormal weight loss -No history of recent colonoscopy therefore he will need 1 outpatient -CT of the abdomen pelvis and chest as described above. I will also defer prostate cancer screening workup as outpatient as his PCP had recently checked his PSA.  Incidental finding of pulmonary nodules - 1 nodule is stable and benign from CT and 716, another nodule 4 mm, patient will need CT follow-up in one year, this was discussed with wife and daughter.   Discharge Condition:  stable   Follow UP  Follow-up Information    [facility]-Home Follow up.   Why:  [facility] physical/occupational therapy Contact information: [place] [address] [phone]        Thressa Sheller, MD Follow up in 1 week(s).   Specialty:  Internal Medicine Contact information: [place] [place] [address] [phone]             Discharge Instructions  and  Discharge Medications     Discharge Instructions    Discharge instructions    Complete by:  As directed  Follow with Primary MD Thressa Sheller, MD in 7 days   Get CBC, CMP,  checked  by Primary MD next visit.    Activity: As tolerated with Full fall precautions use walker/cane & assistance as needed   Disposition Home   Diet: Heart Healthy  , with feeding assistance and aspiration precautions.  For Heart failure patients - Check your Weight same time everyday, if you gain over 2 pounds, or you develop in leg swelling, experience more shortness of breath or chest pain, call your Primary MD immediately. Follow Cardiac Low Salt Diet and 1.5 lit/day fluid restriction.   On your next visit with your primary care physician please Get Medicines reviewed and adjusted.   Please request your Prim.MD to go over all Hospital Tests and Procedure/Radiological results at the follow up, please get all Hospital records sent to your Prim MD by signing hospital release before you go home.   If you experience worsening of your admission symptoms, develop shortness of breath, life threatening emergency, suicidal or homicidal thoughts you must seek medical attention immediately by calling 911 or calling your MD immediately  if symptoms less severe.  You Must read complete instructions/literature along with all the possible adverse reactions/side effects for all the Medicines you take and that have been prescribed to you. Take any new Medicines after you have completely understood and accpet all the possible adverse  reactions/side effects.   Do not drive, operating heavy machinery, perform activities at heights, swimming or participation in water activities or provide baby sitting services if your were admitted for syncope or siezures until you have seen by Primary MD or a Neurologist and advised to do so again.  Do not drive when taking Pain medications.    Do not take more than prescribed Pain, Sleep and Anxiety Medications  Special Instructions: If you have smoked or chewed Tobacco  in the last 2 yrs please stop smoking, stop any regular Alcohol  and or any Recreational drug use.  Wear Seat belts while driving.   Please note  You were cared for by a hospitalist during your hospital stay. If you have any questions about your discharge medications or the care you received while you were in the hospital after you are discharged, you can call the unit and asked to speak with the hospitalist on call if the hospitalist that took care of you is not available. Once you are discharged, your primary care physician will handle any further medical issues. Please note that NO REFILLS for any discharge medications will be authorized once you are discharged, as it is imperative that you return to your primary care physician (or establish a relationship with a primary care physician if you do not have one) for your aftercare needs so that they can reassess your need for medications and monitor your lab values.   Increase activity slowly    Complete by:  As directed      Allergies as of 01/11/2017      Reactions   Klonopin [clonazepam] Other (See Comments)   Sleep walking episodes   Ambien [zolpidem] Other (See Comments)   Sleep walking episodes       Medication List    TAKE these medications   albuterol 108 (90 Base) MCG/ACT inhaler Commonly known as:  PROVENTIL HFA;VENTOLIN HFA Inhale 2 puffs into the lungs every 6 (six) hours as needed for wheezing or shortness of breath.   B-complex with vitamin C  tablet Take 1 tablet by mouth daily.  BELSOMRA 10 MG Tabs Generic drug:  Suvorexant Take 1 tablet by mouth daily as needed.   BLUE-EMU SUPER STRENGTH Crea Apply 1 application topically 2 (two) times daily. Use on both feet   calcium carbonate 500 MG chewable tablet Commonly known as:  TUMS - dosed in mg elemental calcium Chew 1 tablet by mouth as needed for indigestion or heartburn.   gabapentin 800 MG tablet Commonly known as:  NEURONTIN Take 800 mg by mouth 3 (three) times daily.   MULTIVITAMIN ADULT PO Take 1 tablet by mouth daily as needed. Takes One-A-Day for Men   nebivolol 10 MG tablet Commonly known as:  BYSTOLIC Take 5 mg by mouth at bedtime. Takes 1/2 tablet   nitroGLYCERIN 0.4 MG SL tablet Commonly known as:  NITROSTAT Place 1 tablet (0.4 mg total) under the tongue every 5 (five) minutes x 3 doses as needed for chest pain.   NONFORMULARY OR COMPOUNDED ITEM [facility]  Peripheral Neuropathy Cream- Bupivacaine 1%, Doxepin 3%, Gabapentin 6%, Pentoxifylline 3%, Topiramate 1% Apply 1-2 grams to affected area 3-4 times daily Qty. 120 gm 3 refills   omeprazole 20 MG capsule Commonly known as:  PRILOSEC Take 20 mg by mouth daily.   ranitidine 75 MG tablet Commonly known as:  ZANTAC Take 75 mg by mouth daily as needed for heartburn.   Rivaroxaban 15 & 20 MG Tbpk Take as directed on package: Start with one 15mg  tablet by mouth twice a day with food. On Day 22, switch to one 20mg  tablet once a day with food.   VITAMIN D PO Take 1 tablet by mouth daily.         Diet and Activity recommendation: See Discharge Instructions above   Consults obtained - none   Major procedures and Radiology Reports - PLEASE review detailed and final reports for all details, in brief -      Dg Cervical Spine 2 Or 3 Views  Result Date: 01/10/2017 CLINICAL DATA:  Right-sided neck pain. Please note, the patient could not tolerate optimal positioning. The best possible  images were obtained. EXAM: CERVICAL SPINE - 2-3 VIEW COMPARISON:  None. FINDINGS: There is no evidence of cervical spine fracture or prevertebral soft tissue swelling. Alignment is normal. Moderate degenerative changes are noted. No other significant bone abnormalities are identified. IMPRESSION: Limited evaluation secondary to patient positioning. Within these limitations, no acute fracture or prevertebral soft tissue swelling is identified. If there is a history of trauma raising the concern for fracture, noncontrast CT of the C-spine would be the study of choice. Electronically Signed   By: Kristopher Oppenheim M.D.   On: 01/10/2017 15:14   Ct Chest W Contrast  Result Date: 01/08/2017 CLINICAL DATA:  Weight loss, over 20 pounds in 4 weeks. EXAM: CT CHEST, ABDOMEN, AND PELVIS WITH CONTRAST TECHNIQUE: Multidetector CT imaging of the chest, abdomen and pelvis was performed following the standard protocol during bolus administration of intravenous contrast. CONTRAST:  122mL ISOVUE-300 IOPAMIDOL (ISOVUE-300) INJECTION 61% COMPARISON:  Abdominal CT 05/02/2015 FINDINGS: CT CHEST FINDINGS Cardiovascular: Normal heart size. No pericardial effusion. Bilateral central pulmonary emboli branching into all lobes. These have a central reason appearance. RV to LV ratio is normal, 0.78. Atherosclerosis with bulky calcifications along the LAD. Mediastinum/Nodes: Moderate sliding hiatal hernia. Negative for adenopathy. Lungs/Pleura: Small pulmonary nodules, those visible on abdominal CT in 2016 are stable and benign. The largest newly seen nodule measures up to 4 mm average diameter. Musculoskeletal: No acute or aggressive finding. CT ABDOMEN PELVIS  FINDINGS Hepatobiliary: Few small low densities in the liver are cystic appearing. No suspicious lesion.Cholelithiasis. No evidence of inflammation or duct obstruction. Pancreas: Unremarkable. Spleen: Unremarkable. Adrenals/Urinary Tract: Negative adrenals. No hydronephrosis or stone.  Mild heterogeneous cortical enhancement bilaterally on early phase, which resolves on the later phase. Two simple appearing left renal cysts. Small diverticulum or cellule at the apex of the bladder. Stomach/Bowel: Previously seen cecal wall thickening is not apparent today. Moderate sliding hiatal hernia. No obstruction or inflammatory changes. Sigmoid diverticulosis. No appendicitis. Vascular/Lymphatic: Atherosclerotic calcification of the tortuous abdominal aorta. No dissection or major branch occlusion. No asymmetry of common femoral veins. Negative for adenopathy. There is a cystic mass in the right upper quadrant retroperitoneum, between the duodenum, liver, and kidney. The mass measures up to 5.4 x 3.8 cm and has no visible septation or nodularity. Especially on coronal reformats, the mass has an overall stable appearance. Reproductive:No pathologic findings. Other: No ascites or pneumoperitoneum. Shallow fatty inguinal hernias bilaterally. Musculoskeletal: No acute abnormalities. Diffuse disc and facet degeneration with scoliosis. Multilevel lumbar spinal and foraminal narrowing. Critical Value/emergent results were called by telephone at the time of interpretation on 01/08/2017 at 9:58 am to Dr. [name] , who verbally acknowledged these results. IMPRESSION: 1. Bilateral central pulmonary emboli, recent appearing. Negative for right heart strain or lung infarct. 2. No mass, inflammation or adenopathy to explain the history of weight loss. 3. Small pulmonary nodules, those seen at the lung bases on 2016 abdominal CT are stable and benign. Previously nonvisualized nodules measure up to 4 mm. If a high risk patient, chest CT follow-up in 1 year can be considered. 4. 5.4 cm right upper quadrant retroperitoneal cyst with benign appearance, consistent with lymphangioma. 5.  Aortic Atherosclerosis (ICD10-I70.0).  Coronary atherosclerosis. 6. Sigmoid diverticulosis. 7. Small bladder diverticulum suggesting chronic  outlet obstruction. 8. Moderate hiatal hernia. 9. Cholelithiasis Electronically Signed   By: Monte Fantasia M.D.   On: 01/08/2017 10:00   Ct Abdomen Pelvis W Contrast  Result Date: 01/08/2017 CLINICAL DATA:  Weight loss, over 20 pounds in 4 weeks. EXAM: CT CHEST, ABDOMEN, AND PELVIS WITH CONTRAST TECHNIQUE: Multidetector CT imaging of the chest, abdomen and pelvis was performed following the standard protocol during bolus administration of intravenous contrast. CONTRAST:  134mL ISOVUE-300 IOPAMIDOL (ISOVUE-300) INJECTION 61% COMPARISON:  Abdominal CT 05/02/2015 FINDINGS: CT CHEST FINDINGS Cardiovascular: Normal heart size. No pericardial effusion. Bilateral central pulmonary emboli branching into all lobes. These have a central reason appearance. RV to LV ratio is normal, 0.78. Atherosclerosis with bulky calcifications along the LAD. Mediastinum/Nodes: Moderate sliding hiatal hernia. Negative for adenopathy. Lungs/Pleura: Small pulmonary nodules, those visible on abdominal CT in 2016 are stable and benign. The largest newly seen nodule measures up to 4 mm average diameter. Musculoskeletal: No acute or aggressive finding. CT ABDOMEN PELVIS FINDINGS Hepatobiliary: Few small low densities in the liver are cystic appearing. No suspicious lesion.Cholelithiasis. No evidence of inflammation or duct obstruction. Pancreas: Unremarkable. Spleen: Unremarkable. Adrenals/Urinary Tract: Negative adrenals. No hydronephrosis or stone. Mild heterogeneous cortical enhancement bilaterally on early phase, which resolves on the later phase. Two simple appearing left renal cysts. Small diverticulum or cellule at the apex of the bladder. Stomach/Bowel: Previously seen cecal wall thickening is not apparent today. Moderate sliding hiatal hernia. No obstruction or inflammatory changes. Sigmoid diverticulosis. No appendicitis. Vascular/Lymphatic: Atherosclerotic calcification of the tortuous abdominal aorta. No dissection or major branch  occlusion. No asymmetry of common femoral veins. Negative for adenopathy. There is a cystic mass in  the right upper quadrant retroperitoneum, between the duodenum, liver, and kidney. The mass measures up to 5.4 x 3.8 cm and has no visible septation or nodularity. Especially on coronal reformats, the mass has an overall stable appearance. Reproductive:No pathologic findings. Other: No ascites or pneumoperitoneum. Shallow fatty inguinal hernias bilaterally. Musculoskeletal: No acute abnormalities. Diffuse disc and facet degeneration with scoliosis. Multilevel lumbar spinal and foraminal narrowing. Critical Value/emergent results were called by telephone at the time of interpretation on 01/08/2017 at 9:58 am to Dr. [name] , who verbally acknowledged these results. IMPRESSION: 1. Bilateral central pulmonary emboli, recent appearing. Negative for right heart strain or lung infarct. 2. No mass, inflammation or adenopathy to explain the history of weight loss. 3. Small pulmonary nodules, those seen at the lung bases on 2016 abdominal CT are stable and benign. Previously nonvisualized nodules measure up to 4 mm. If a high risk patient, chest CT follow-up in 1 year can be considered. 4. 5.4 cm right upper quadrant retroperitoneal cyst with benign appearance, consistent with lymphangioma. 5.  Aortic Atherosclerosis (ICD10-I70.0).  Coronary atherosclerosis. 6. Sigmoid diverticulosis. 7. Small bladder diverticulum suggesting chronic outlet obstruction. 8. Moderate hiatal hernia. 9. Cholelithiasis Electronically Signed   By: Monte Fantasia M.D.   On: 01/08/2017 10:00    Micro Results No results found for this or any previous visit (from the past 240 hour(s)).     Today   Subjective:   Mays Paino today Is more awake, alert, had some breakfast, still had some agitation and confusion overnight, but much less than the night before .   Objective:   Blood pressure 131/74, pulse 78, temperature 97.7 F (36.5 C),  temperature source Oral, resp. rate 20, height 5\' 7"  (1.702 m), weight 70.1 kg (154 lb 8 oz), SpO2 99 %.   Intake/Output Summary (Last 24 hours) at 01/11/17 1216 Last data filed at 01/11/17 1207  Gross per 24 hour  Intake              860 ml  Output             1000 ml  Net             -140 ml    Exam Awake Alert, 2, mildly confused  Symmetrical Chest wall movement, Good air movement bilaterally, CTAB RRR,No Gallops,Rubs or new Murmurs, No Parasternal Heave +ve B.Sounds, Abd Soft, Non tender,  No rebound -guarding or rigidity. No Cyanosis, Clubbing or edema, No new Rash or bruise  Data Review   CBC w Diff:  Lab Results  Component Value Date   WBC 8.6 01/11/2017   HGB 13.4 01/11/2017   HCT 39.9 01/11/2017   PLT 183 01/11/2017   LYMPHOPCT 21 01/08/2017   MONOPCT 13 01/08/2017   EOSPCT 1 01/08/2017   BASOPCT 0 01/08/2017    CMP:  Lab Results  Component Value Date   NA 141 01/08/2017   K 3.8 01/08/2017   CL 104 01/08/2017   CO2 24 01/08/2017   BUN 10 01/08/2017   CREATININE 1.00 01/08/2017   PROT 8.4 (H) 01/08/2017   ALBUMIN 4.3 01/08/2017   BILITOT 1.5 (H) 01/08/2017   ALKPHOS 67 01/08/2017   AST 24 01/08/2017   ALT 14 (L) 01/08/2017  .   Total Time in preparing paper work, data evaluation and todays exam - 35 minutes  Tomaz Janis M.D on 01/11/2017 at 12:16 PM  [facility]   Office  [phone]

## 2017-01-11 NOTE — Progress Notes (Signed)
Physical Therapy Treatment Patient Details Name: Corey Larson. MRN: [id number] DOB: [birth year] Today's Date: 01/11/2017    History of Present Illness 83 year old male with history of coronary artery disease status post drug-eluting stent in LAD in 2005, peripheral neuropathy, hypertension, hyperlipidemia and dementia and admitted for bilateral pulmonary embolism.    PT Comments    Pt more agreeable to mobility today. Pt ambulated 200 ft with RW with 2 LOB episodes corrected by PT. Pt continually focused on need to use restroom and need to urinate during session.   Follow Up Recommendations  Home health PT;Supervision/Assistance - 24 hour     Equipment Recommendations  None recommended by PT    Recommendations for Other Services       Precautions / Restrictions Precautions Precautions: Fall Restrictions Weight Bearing Restrictions: No    Mobility  Bed Mobility Overal bed mobility: Needs Assistance Bed Mobility: Supine to Sit;Sit to Supine Rolling: Mod assist   Supine to sit: Max assist     General bed mobility comments: Pt requires bilat UE support to move trunk but pt able to manage LE from supine to sit, pt requires max assist to move trunk and LE from sit to supine  Transfers Overall transfer level: Needs assistance Equipment used: 1 person hand held assist Transfers: Sit to/from Stand Sit to Stand: Min guard         General transfer comment: Pt required hand held assist for UE support to stand and steady   Ambulation/Gait Ambulation/Gait assistance: Min assist Ambulation Distance (Feet): 200 Feet Assistive device: Rolling walker (2 wheeled) Gait Pattern/deviations: Step-through pattern;Decreased stride length;Trunk flexed;Drifts right/left     General Gait Details: Pt tolerated 200 ft with RW, pt had two LOB episodes while ambulating and continually drifted to the right, LOB corrected by PT    Stairs            Wheelchair Mobility     Modified Rankin (Stroke Patients Only)       Balance Overall balance assessment: Needs assistance   Sitting balance-Leahy Scale: Good     Standing balance support: No upper extremity supported Standing balance-Leahy Scale: Fair                              Cognition Arousal/Alertness: Awake/alert Behavior During Therapy: WFL for tasks assessed/performed                                   General Comments: Pt was more appropriate today but continually focused on finding the restroom and needing to urinate, pt agreeable for mobility       Exercises      General Comments        Pertinent Vitals/Pain Pain Assessment: Faces Faces Pain Scale: Hurts even more Pain Location: Neck  Pain Descriptors / Indicators: Grimacing Pain Intervention(s): Limited activity within patient's tolerance;Monitored during session;Repositioned    Home Living                      Prior Function            PT Goals (current goals can now be found in the care plan section) Progress towards PT goals: Progressing toward goals    Frequency    Min 3X/week      PT Plan Current plan remains appropriate    Co-evaluation  AM-PAC PT "6 Clicks" Daily Activity  Outcome Measure  Difficulty turning over in bed (including adjusting bedclothes, sheets and blankets)?: None Difficulty moving from lying on back to sitting on the side of the bed? : A Little Difficulty sitting down on and standing up from a chair with arms (e.g., wheelchair, bedside commode, etc,.)?: A Lot Help needed moving to and from a bed to chair (including a wheelchair)?: A Little Help needed walking in hospital room?: A Little Help needed climbing 3-5 steps with a railing? : A Lot 6 Click Score: 17    End of Session Equipment Utilized During Treatment: Gait belt Activity Tolerance: Patient tolerated treatment well Patient left: in bed;with family/visitor present;with  call bell/phone within reach;with nursing/sitter in room;with bed alarm set Nurse Communication: Mobility status (RN observed ambulation in hall) PT Visit Diagnosis: Difficulty in walking, not elsewhere classified (R26.2)     Time: 4196-2229 PT Time Calculation (min) (ACUTE ONLY): 19 min  Charges:  $Gait Training: 8-22 mins                    G Codes:       Olegario Shearer, SPT    Reino Bellis 01/11/2017, 1:17 PM

## 2017-01-15 DIAGNOSIS — G4733 Obstructive sleep apnea (adult) (pediatric): Secondary | ICD-10-CM | POA: Diagnosis not present

## 2017-01-15 DIAGNOSIS — R634 Abnormal weight loss: Secondary | ICD-10-CM | POA: Diagnosis not present

## 2017-01-15 DIAGNOSIS — I129 Hypertensive chronic kidney disease with stage 1 through stage 4 chronic kidney disease, or unspecified chronic kidney disease: Secondary | ICD-10-CM | POA: Diagnosis not present

## 2017-01-15 DIAGNOSIS — Z87891 Personal history of nicotine dependence: Secondary | ICD-10-CM | POA: Diagnosis not present

## 2017-01-15 DIAGNOSIS — E785 Hyperlipidemia, unspecified: Secondary | ICD-10-CM | POA: Diagnosis not present

## 2017-01-15 DIAGNOSIS — Z955 Presence of coronary angioplasty implant and graft: Secondary | ICD-10-CM | POA: Diagnosis not present

## 2017-01-15 DIAGNOSIS — N189 Chronic kidney disease, unspecified: Secondary | ICD-10-CM | POA: Diagnosis not present

## 2017-01-15 DIAGNOSIS — R296 Repeated falls: Secondary | ICD-10-CM | POA: Diagnosis not present

## 2017-01-15 DIAGNOSIS — I2510 Atherosclerotic heart disease of native coronary artery without angina pectoris: Secondary | ICD-10-CM | POA: Diagnosis not present

## 2017-01-15 DIAGNOSIS — I2699 Other pulmonary embolism without acute cor pulmonale: Secondary | ICD-10-CM | POA: Diagnosis not present

## 2017-01-15 DIAGNOSIS — G629 Polyneuropathy, unspecified: Secondary | ICD-10-CM | POA: Diagnosis not present

## 2017-01-17 DIAGNOSIS — I2510 Atherosclerotic heart disease of native coronary artery without angina pectoris: Secondary | ICD-10-CM | POA: Diagnosis not present

## 2017-01-17 DIAGNOSIS — R634 Abnormal weight loss: Secondary | ICD-10-CM | POA: Diagnosis not present

## 2017-01-17 DIAGNOSIS — N189 Chronic kidney disease, unspecified: Secondary | ICD-10-CM | POA: Diagnosis not present

## 2017-01-17 DIAGNOSIS — I2699 Other pulmonary embolism without acute cor pulmonale: Secondary | ICD-10-CM | POA: Diagnosis not present

## 2017-01-17 DIAGNOSIS — G629 Polyneuropathy, unspecified: Secondary | ICD-10-CM | POA: Diagnosis not present

## 2017-01-17 DIAGNOSIS — I129 Hypertensive chronic kidney disease with stage 1 through stage 4 chronic kidney disease, or unspecified chronic kidney disease: Secondary | ICD-10-CM | POA: Diagnosis not present

## 2017-01-21 DIAGNOSIS — D485 Neoplasm of uncertain behavior of skin: Secondary | ICD-10-CM | POA: Diagnosis not present

## 2017-01-21 DIAGNOSIS — D692 Other nonthrombocytopenic purpura: Secondary | ICD-10-CM | POA: Diagnosis not present

## 2017-01-21 DIAGNOSIS — D1801 Hemangioma of skin and subcutaneous tissue: Secondary | ICD-10-CM | POA: Diagnosis not present

## 2017-01-21 DIAGNOSIS — D0461 Carcinoma in situ of skin of right upper limb, including shoulder: Secondary | ICD-10-CM | POA: Diagnosis not present

## 2017-01-21 DIAGNOSIS — D0462 Carcinoma in situ of skin of left upper limb, including shoulder: Secondary | ICD-10-CM | POA: Diagnosis not present

## 2017-01-21 DIAGNOSIS — Z85828 Personal history of other malignant neoplasm of skin: Secondary | ICD-10-CM | POA: Diagnosis not present

## 2017-01-21 DIAGNOSIS — L821 Other seborrheic keratosis: Secondary | ICD-10-CM | POA: Diagnosis not present

## 2017-01-21 DIAGNOSIS — L570 Actinic keratosis: Secondary | ICD-10-CM | POA: Diagnosis not present

## 2017-01-22 DIAGNOSIS — G629 Polyneuropathy, unspecified: Secondary | ICD-10-CM | POA: Diagnosis not present

## 2017-01-22 DIAGNOSIS — I2510 Atherosclerotic heart disease of native coronary artery without angina pectoris: Secondary | ICD-10-CM | POA: Diagnosis not present

## 2017-01-22 DIAGNOSIS — I2699 Other pulmonary embolism without acute cor pulmonale: Secondary | ICD-10-CM | POA: Diagnosis not present

## 2017-01-22 DIAGNOSIS — I129 Hypertensive chronic kidney disease with stage 1 through stage 4 chronic kidney disease, or unspecified chronic kidney disease: Secondary | ICD-10-CM | POA: Diagnosis not present

## 2017-01-22 DIAGNOSIS — N189 Chronic kidney disease, unspecified: Secondary | ICD-10-CM | POA: Diagnosis not present

## 2017-01-22 DIAGNOSIS — R634 Abnormal weight loss: Secondary | ICD-10-CM | POA: Diagnosis not present

## 2017-01-22 DIAGNOSIS — R918 Other nonspecific abnormal finding of lung field: Secondary | ICD-10-CM | POA: Diagnosis not present

## 2017-01-23 DIAGNOSIS — G629 Polyneuropathy, unspecified: Secondary | ICD-10-CM | POA: Diagnosis not present

## 2017-01-23 DIAGNOSIS — N189 Chronic kidney disease, unspecified: Secondary | ICD-10-CM | POA: Diagnosis not present

## 2017-01-23 DIAGNOSIS — R634 Abnormal weight loss: Secondary | ICD-10-CM | POA: Diagnosis not present

## 2017-01-23 DIAGNOSIS — I129 Hypertensive chronic kidney disease with stage 1 through stage 4 chronic kidney disease, or unspecified chronic kidney disease: Secondary | ICD-10-CM | POA: Diagnosis not present

## 2017-01-23 DIAGNOSIS — I2510 Atherosclerotic heart disease of native coronary artery without angina pectoris: Secondary | ICD-10-CM | POA: Diagnosis not present

## 2017-01-23 DIAGNOSIS — I2699 Other pulmonary embolism without acute cor pulmonale: Secondary | ICD-10-CM | POA: Diagnosis not present

## 2017-01-24 DIAGNOSIS — G629 Polyneuropathy, unspecified: Secondary | ICD-10-CM | POA: Diagnosis not present

## 2017-01-24 DIAGNOSIS — N189 Chronic kidney disease, unspecified: Secondary | ICD-10-CM | POA: Diagnosis not present

## 2017-01-24 DIAGNOSIS — I2510 Atherosclerotic heart disease of native coronary artery without angina pectoris: Secondary | ICD-10-CM | POA: Diagnosis not present

## 2017-01-24 DIAGNOSIS — R634 Abnormal weight loss: Secondary | ICD-10-CM | POA: Diagnosis not present

## 2017-01-24 DIAGNOSIS — I2699 Other pulmonary embolism without acute cor pulmonale: Secondary | ICD-10-CM | POA: Diagnosis not present

## 2017-01-24 DIAGNOSIS — I129 Hypertensive chronic kidney disease with stage 1 through stage 4 chronic kidney disease, or unspecified chronic kidney disease: Secondary | ICD-10-CM | POA: Diagnosis not present

## 2017-01-25 ENCOUNTER — Telehealth: Payer: Self-pay | Admitting: Podiatry

## 2017-01-25 DIAGNOSIS — R634 Abnormal weight loss: Secondary | ICD-10-CM | POA: Diagnosis not present

## 2017-01-25 DIAGNOSIS — I2699 Other pulmonary embolism without acute cor pulmonale: Secondary | ICD-10-CM | POA: Diagnosis not present

## 2017-01-25 DIAGNOSIS — G629 Polyneuropathy, unspecified: Secondary | ICD-10-CM | POA: Diagnosis not present

## 2017-01-25 DIAGNOSIS — N189 Chronic kidney disease, unspecified: Secondary | ICD-10-CM | POA: Diagnosis not present

## 2017-01-25 DIAGNOSIS — I2510 Atherosclerotic heart disease of native coronary artery without angina pectoris: Secondary | ICD-10-CM | POA: Diagnosis not present

## 2017-01-25 DIAGNOSIS — I129 Hypertensive chronic kidney disease with stage 1 through stage 4 chronic kidney disease, or unspecified chronic kidney disease: Secondary | ICD-10-CM | POA: Diagnosis not present

## 2017-01-25 NOTE — Telephone Encounter (Signed)
Herbert Deaner, physical therapist with [facility]. Pt saw Dr. Amalia Hailey a few times for his right lateral foot. He has expressed he is experiencing pain and is thinking its about time for an appointment. If you want to call you can, but it sounds like they will be calling to get that appointment scheduled. There is no redness, drainage, breaking of skin currently, looks like its callused over. States he has more pain when standing. My number is [phone].

## 2017-01-29 DIAGNOSIS — G629 Polyneuropathy, unspecified: Secondary | ICD-10-CM | POA: Diagnosis not present

## 2017-01-29 DIAGNOSIS — I129 Hypertensive chronic kidney disease with stage 1 through stage 4 chronic kidney disease, or unspecified chronic kidney disease: Secondary | ICD-10-CM | POA: Diagnosis not present

## 2017-01-29 DIAGNOSIS — N189 Chronic kidney disease, unspecified: Secondary | ICD-10-CM | POA: Diagnosis not present

## 2017-01-29 DIAGNOSIS — I2510 Atherosclerotic heart disease of native coronary artery without angina pectoris: Secondary | ICD-10-CM | POA: Diagnosis not present

## 2017-01-29 DIAGNOSIS — I2699 Other pulmonary embolism without acute cor pulmonale: Secondary | ICD-10-CM | POA: Diagnosis not present

## 2017-01-29 DIAGNOSIS — R634 Abnormal weight loss: Secondary | ICD-10-CM | POA: Diagnosis not present

## 2017-01-30 DIAGNOSIS — I2699 Other pulmonary embolism without acute cor pulmonale: Secondary | ICD-10-CM | POA: Diagnosis not present

## 2017-01-30 DIAGNOSIS — I2510 Atherosclerotic heart disease of native coronary artery without angina pectoris: Secondary | ICD-10-CM | POA: Diagnosis not present

## 2017-01-30 DIAGNOSIS — G629 Polyneuropathy, unspecified: Secondary | ICD-10-CM | POA: Diagnosis not present

## 2017-01-30 DIAGNOSIS — N189 Chronic kidney disease, unspecified: Secondary | ICD-10-CM | POA: Diagnosis not present

## 2017-01-30 DIAGNOSIS — R634 Abnormal weight loss: Secondary | ICD-10-CM | POA: Diagnosis not present

## 2017-01-30 DIAGNOSIS — I129 Hypertensive chronic kidney disease with stage 1 through stage 4 chronic kidney disease, or unspecified chronic kidney disease: Secondary | ICD-10-CM | POA: Diagnosis not present

## 2017-01-31 DIAGNOSIS — R634 Abnormal weight loss: Secondary | ICD-10-CM | POA: Diagnosis not present

## 2017-01-31 DIAGNOSIS — I2699 Other pulmonary embolism without acute cor pulmonale: Secondary | ICD-10-CM | POA: Diagnosis not present

## 2017-01-31 DIAGNOSIS — N189 Chronic kidney disease, unspecified: Secondary | ICD-10-CM | POA: Diagnosis not present

## 2017-01-31 DIAGNOSIS — I2510 Atherosclerotic heart disease of native coronary artery without angina pectoris: Secondary | ICD-10-CM | POA: Diagnosis not present

## 2017-01-31 DIAGNOSIS — G629 Polyneuropathy, unspecified: Secondary | ICD-10-CM | POA: Diagnosis not present

## 2017-01-31 DIAGNOSIS — I129 Hypertensive chronic kidney disease with stage 1 through stage 4 chronic kidney disease, or unspecified chronic kidney disease: Secondary | ICD-10-CM | POA: Diagnosis not present

## 2017-02-01 DIAGNOSIS — I129 Hypertensive chronic kidney disease with stage 1 through stage 4 chronic kidney disease, or unspecified chronic kidney disease: Secondary | ICD-10-CM | POA: Diagnosis not present

## 2017-02-01 DIAGNOSIS — I2699 Other pulmonary embolism without acute cor pulmonale: Secondary | ICD-10-CM | POA: Diagnosis not present

## 2017-02-01 DIAGNOSIS — R634 Abnormal weight loss: Secondary | ICD-10-CM | POA: Diagnosis not present

## 2017-02-01 DIAGNOSIS — N189 Chronic kidney disease, unspecified: Secondary | ICD-10-CM | POA: Diagnosis not present

## 2017-02-01 DIAGNOSIS — G629 Polyneuropathy, unspecified: Secondary | ICD-10-CM | POA: Diagnosis not present

## 2017-02-01 DIAGNOSIS — I2510 Atherosclerotic heart disease of native coronary artery without angina pectoris: Secondary | ICD-10-CM | POA: Diagnosis not present

## 2017-02-05 DIAGNOSIS — I2699 Other pulmonary embolism without acute cor pulmonale: Secondary | ICD-10-CM | POA: Diagnosis not present

## 2017-02-05 DIAGNOSIS — I129 Hypertensive chronic kidney disease with stage 1 through stage 4 chronic kidney disease, or unspecified chronic kidney disease: Secondary | ICD-10-CM | POA: Diagnosis not present

## 2017-02-05 DIAGNOSIS — N189 Chronic kidney disease, unspecified: Secondary | ICD-10-CM | POA: Diagnosis not present

## 2017-02-05 DIAGNOSIS — R634 Abnormal weight loss: Secondary | ICD-10-CM | POA: Diagnosis not present

## 2017-02-05 DIAGNOSIS — I2510 Atherosclerotic heart disease of native coronary artery without angina pectoris: Secondary | ICD-10-CM | POA: Diagnosis not present

## 2017-02-05 DIAGNOSIS — G629 Polyneuropathy, unspecified: Secondary | ICD-10-CM | POA: Diagnosis not present

## 2017-02-06 DIAGNOSIS — N189 Chronic kidney disease, unspecified: Secondary | ICD-10-CM | POA: Diagnosis not present

## 2017-02-06 DIAGNOSIS — I2699 Other pulmonary embolism without acute cor pulmonale: Secondary | ICD-10-CM | POA: Diagnosis not present

## 2017-02-06 DIAGNOSIS — I2510 Atherosclerotic heart disease of native coronary artery without angina pectoris: Secondary | ICD-10-CM | POA: Diagnosis not present

## 2017-02-06 DIAGNOSIS — R634 Abnormal weight loss: Secondary | ICD-10-CM | POA: Diagnosis not present

## 2017-02-06 DIAGNOSIS — Z961 Presence of intraocular lens: Secondary | ICD-10-CM | POA: Diagnosis not present

## 2017-02-06 DIAGNOSIS — G629 Polyneuropathy, unspecified: Secondary | ICD-10-CM | POA: Diagnosis not present

## 2017-02-06 DIAGNOSIS — H52203 Unspecified astigmatism, bilateral: Secondary | ICD-10-CM | POA: Diagnosis not present

## 2017-02-06 DIAGNOSIS — I129 Hypertensive chronic kidney disease with stage 1 through stage 4 chronic kidney disease, or unspecified chronic kidney disease: Secondary | ICD-10-CM | POA: Diagnosis not present

## 2017-02-06 DIAGNOSIS — H04123 Dry eye syndrome of bilateral lacrimal glands: Secondary | ICD-10-CM | POA: Diagnosis not present

## 2017-02-06 DIAGNOSIS — H353221 Exudative age-related macular degeneration, left eye, with active choroidal neovascularization: Secondary | ICD-10-CM | POA: Diagnosis not present

## 2017-02-08 DIAGNOSIS — I2510 Atherosclerotic heart disease of native coronary artery without angina pectoris: Secondary | ICD-10-CM | POA: Diagnosis not present

## 2017-02-08 DIAGNOSIS — N189 Chronic kidney disease, unspecified: Secondary | ICD-10-CM | POA: Diagnosis not present

## 2017-02-08 DIAGNOSIS — I129 Hypertensive chronic kidney disease with stage 1 through stage 4 chronic kidney disease, or unspecified chronic kidney disease: Secondary | ICD-10-CM | POA: Diagnosis not present

## 2017-02-08 DIAGNOSIS — I2699 Other pulmonary embolism without acute cor pulmonale: Secondary | ICD-10-CM | POA: Diagnosis not present

## 2017-02-08 DIAGNOSIS — R634 Abnormal weight loss: Secondary | ICD-10-CM | POA: Diagnosis not present

## 2017-02-08 DIAGNOSIS — G629 Polyneuropathy, unspecified: Secondary | ICD-10-CM | POA: Diagnosis not present

## 2017-02-11 DIAGNOSIS — I129 Hypertensive chronic kidney disease with stage 1 through stage 4 chronic kidney disease, or unspecified chronic kidney disease: Secondary | ICD-10-CM | POA: Diagnosis not present

## 2017-02-11 DIAGNOSIS — I2510 Atherosclerotic heart disease of native coronary artery without angina pectoris: Secondary | ICD-10-CM | POA: Diagnosis not present

## 2017-02-11 DIAGNOSIS — R634 Abnormal weight loss: Secondary | ICD-10-CM | POA: Diagnosis not present

## 2017-02-11 DIAGNOSIS — N189 Chronic kidney disease, unspecified: Secondary | ICD-10-CM | POA: Diagnosis not present

## 2017-02-11 DIAGNOSIS — I2699 Other pulmonary embolism without acute cor pulmonale: Secondary | ICD-10-CM | POA: Diagnosis not present

## 2017-02-11 DIAGNOSIS — G629 Polyneuropathy, unspecified: Secondary | ICD-10-CM | POA: Diagnosis not present

## 2017-02-12 DIAGNOSIS — H353211 Exudative age-related macular degeneration, right eye, with active choroidal neovascularization: Secondary | ICD-10-CM | POA: Diagnosis not present

## 2017-02-12 DIAGNOSIS — R634 Abnormal weight loss: Secondary | ICD-10-CM | POA: Diagnosis not present

## 2017-02-12 DIAGNOSIS — G629 Polyneuropathy, unspecified: Secondary | ICD-10-CM | POA: Diagnosis not present

## 2017-02-12 DIAGNOSIS — I2699 Other pulmonary embolism without acute cor pulmonale: Secondary | ICD-10-CM | POA: Diagnosis not present

## 2017-02-12 DIAGNOSIS — H353221 Exudative age-related macular degeneration, left eye, with active choroidal neovascularization: Secondary | ICD-10-CM | POA: Diagnosis not present

## 2017-02-12 DIAGNOSIS — N189 Chronic kidney disease, unspecified: Secondary | ICD-10-CM | POA: Diagnosis not present

## 2017-02-12 DIAGNOSIS — I129 Hypertensive chronic kidney disease with stage 1 through stage 4 chronic kidney disease, or unspecified chronic kidney disease: Secondary | ICD-10-CM | POA: Diagnosis not present

## 2017-02-12 DIAGNOSIS — I2510 Atherosclerotic heart disease of native coronary artery without angina pectoris: Secondary | ICD-10-CM | POA: Diagnosis not present

## 2017-02-19 DIAGNOSIS — I2699 Other pulmonary embolism without acute cor pulmonale: Secondary | ICD-10-CM | POA: Diagnosis not present

## 2017-02-20 DIAGNOSIS — I2699 Other pulmonary embolism without acute cor pulmonale: Secondary | ICD-10-CM | POA: Diagnosis not present

## 2017-02-20 DIAGNOSIS — G629 Polyneuropathy, unspecified: Secondary | ICD-10-CM | POA: Diagnosis not present

## 2017-02-20 DIAGNOSIS — N189 Chronic kidney disease, unspecified: Secondary | ICD-10-CM | POA: Diagnosis not present

## 2017-02-20 DIAGNOSIS — I129 Hypertensive chronic kidney disease with stage 1 through stage 4 chronic kidney disease, or unspecified chronic kidney disease: Secondary | ICD-10-CM | POA: Diagnosis not present

## 2017-02-20 DIAGNOSIS — I2510 Atherosclerotic heart disease of native coronary artery without angina pectoris: Secondary | ICD-10-CM | POA: Diagnosis not present

## 2017-02-20 DIAGNOSIS — R634 Abnormal weight loss: Secondary | ICD-10-CM | POA: Diagnosis not present

## 2017-02-22 DIAGNOSIS — R413 Other amnesia: Secondary | ICD-10-CM | POA: Diagnosis not present

## 2017-02-22 DIAGNOSIS — I2510 Atherosclerotic heart disease of native coronary artery without angina pectoris: Secondary | ICD-10-CM | POA: Diagnosis not present

## 2017-02-22 DIAGNOSIS — I2699 Other pulmonary embolism without acute cor pulmonale: Secondary | ICD-10-CM | POA: Diagnosis not present

## 2017-02-22 DIAGNOSIS — Z23 Encounter for immunization: Secondary | ICD-10-CM | POA: Diagnosis not present

## 2017-02-26 DIAGNOSIS — N189 Chronic kidney disease, unspecified: Secondary | ICD-10-CM | POA: Diagnosis not present

## 2017-02-26 DIAGNOSIS — G629 Polyneuropathy, unspecified: Secondary | ICD-10-CM | POA: Diagnosis not present

## 2017-02-26 DIAGNOSIS — I129 Hypertensive chronic kidney disease with stage 1 through stage 4 chronic kidney disease, or unspecified chronic kidney disease: Secondary | ICD-10-CM | POA: Diagnosis not present

## 2017-02-26 DIAGNOSIS — I2510 Atherosclerotic heart disease of native coronary artery without angina pectoris: Secondary | ICD-10-CM | POA: Diagnosis not present

## 2017-02-26 DIAGNOSIS — R634 Abnormal weight loss: Secondary | ICD-10-CM | POA: Diagnosis not present

## 2017-02-26 DIAGNOSIS — I2699 Other pulmonary embolism without acute cor pulmonale: Secondary | ICD-10-CM | POA: Diagnosis not present

## 2017-03-05 ENCOUNTER — Ambulatory Visit: Payer: Medicare Other | Attending: Internal Medicine | Admitting: Physical Therapy

## 2017-03-05 DIAGNOSIS — R531 Weakness: Secondary | ICD-10-CM | POA: Diagnosis not present

## 2017-03-05 DIAGNOSIS — M545 Low back pain: Secondary | ICD-10-CM | POA: Insufficient documentation

## 2017-03-05 DIAGNOSIS — G8929 Other chronic pain: Secondary | ICD-10-CM | POA: Diagnosis not present

## 2017-03-05 DIAGNOSIS — R2681 Unsteadiness on feet: Secondary | ICD-10-CM | POA: Diagnosis not present

## 2017-03-05 DIAGNOSIS — R262 Difficulty in walking, not elsewhere classified: Secondary | ICD-10-CM

## 2017-03-05 NOTE — Therapy (Signed)
[facility] [address], [place], [place], [address] Phone: [phone]   Fax:  [phone]  Physical Therapy Evaluation  Patient Details  Name: Corey Larson. MRN: [id number] Date of Birth: 30-Apr-1928 Referring Provider: Dr. [name]  Encounter Date: 03/05/2017      PT End of Session - 03/05/17 1133    Visit Number 1   Number of Visits 10   Date for PT Re-Evaluation 05-11-2017   Authorization Type G codes;  KX now because of previous PT   PT Start Time 1100   PT Stop Time 1140   PT Time Calculation (min) 40 min   Activity Tolerance Patient tolerated treatment well      Past Medical History:  Diagnosis Date  . Abnormality of gait 11/05/2013  . CAD (coronary artery disease) 2005   stent to LAD by Dr. Glade Lloyd. mild irregulatities of RCA.    [name] CRF (chronic renal failure)   . ED (erectile dysfunction)   . H/O exercise stress test 05/2012   no statistically significant ischemia.   [name] History of TIA (transient ischemic attack)    transient confusion  . HOH (hard of hearing)    hearing aids  . Hyperlipidemia LDL goal < 70   . Hypertension   . Insomnia   . Macular degeneration    bilateral  . Macular degeneration    Right>left  . NSTEMI (non-ST elevated myocardial infarction) ([place]) 04/16/2013  . OSA (obstructive sleep apnea)    mild, never on cpap  . Peripheral neuropathy   . Vitamin D deficiency     Past Surgical History:  Procedure Laterality Date  . ANKLE SURGERY     right ankle 2002 fracture  . BACK SURGERY  1976   laminectomy  . CARDIAC CATHETERIZATION     2005, stent placed DES LAD  . CATARACT EXTRACTION     bilateral  . CORONARY ANGIOPLASTY    . HERNIA REPAIR  1981   "double hernia"  . KNEE SURGERY     right knee 2011   . LEFT HEART CATHETERIZATION WITH CORONARY ANGIOGRAM N/A 04/16/2013   Procedure: LEFT HEART CATHETERIZATION WITH CORONARY ANGIOGRAM;  Surgeon: Leonie Man, MD;   Location: [facility] CATH LAB;  Service: Cardiovascular;  Laterality: N/A;  . NM MYOCAR MULTIPLE W/SPECT  05/13/2012   MILD UPPER SEPTAL THINNING AND DIAPHRAGMATIC ATTENUATION W/O SIGNIFICANT ISCHEMIA. EF 66%.  . TRANSTHORACIC ECHOCARDIOGRAM  05/12/2010   EF =>55%. MILD CONCENTRIC LVH. TRACE MITRAL REGURG.    There were no vitals filed for this visit.       Subjective Assessment - 03/05/17 1106    Subjective Patient had recent blood clots and PE with hospitalization 9-10 weeks ago.  Feels balance is unchanged.  Wife notes rounding over.  Has a rolling walker but hasn't used since right after the hospital.  Walks around [employer] pushing shopping cart 15 min.  Been doing HHPT, nursing, speech   Patient is accompained by: Family member  wife;  left 1/2 way throught eval   Pertinent History neuropathy in both feet painful burning   Limitations Walking;House hold activities   How long can you walk comfortably? 15 min   Diagnostic tests CT scan    Patient Stated Goals balance get better;  wedding at the end of the month;  hopes to go to [employer] in [place];  play golf   Currently in Pain? Yes   Pain Location Foot   Pain Orientation Right;Left  Pain Type Chronic pain   Aggravating Factors  my feet just hurt all the time   Pain Relieving Factors ice            [facility] PT Assessment - 03/05/17 0001      Assessment   Medical Diagnosis risk of falls   Referring Provider Dr. [name]   Onset Date/Surgical Date --  6 months   Next MD Visit Nov   Prior Therapy HHPT;  previous Outpatient PT     Precautions   Precautions Fall     Restrictions   Weight Bearing Restrictions No     Balance Screen   Has the patient fallen in the past 6 months Yes   How many times? 1x  right toe lags and will catch;  sleep aids cause wandering   Has the patient had a decrease in activity level because of a fear of falling?  Yes   Is the patient reluctant to leave their home because of a fear of falling?  No      Home Environment   Living Environment Private residence   Living Arrangements Spouse/significant other   Type of [place] Access Stairs to enter   Entrance Stairs-Number of Steps 3   Home Layout Two level   Alternate Level Stairs-Number of Steps 15   Alternate Level Stairs-Rails Right;Left   Additional Comments has RW and cane but does not use     Prior Function   Level of Independence Independent with basic ADLs  able to drive   Vocation Retired   Leisure travel;  golf     AROM   Right/Left Shoulder --  UE ROM WFLS     Strength   Right/Left Hip Right;Left   Right Hip Flexion 4-/5   Right Hip Extension 3+/5   Right Hip ABduction 3+/5   Right Hip ADduction 4-/5   Left Hip Flexion 4-/5   Left Hip Extension 3+/5   Left Hip ABduction 3+/5   Left Hip ADduction 4-/5   Right/Left Knee Right;Left   Right Knee Flexion 4-/5   Right Knee Extension 4-/5   Left Knee Flexion 4-/5   Left Knee Extension 4-/5   Right/Left Ankle Right;Left   Right Ankle Dorsiflexion 3+/5   Right Ankle Eversion 3+/5   Left Ankle Dorsiflexion 3+/5   Left Ankle Plantar Flexion 3+/5   Left Ankle Inversion 3+/5     Flexibility   Soft Tissue Assessment /Muscle Length yes   Hamstrings decreased bil unable to fully extend in sitting   Quadriceps decreased bil hip flexor muscle lengths     Ambulation/Gait   Stairs Yes   Stair Management Technique Two rails;Alternating pattern   Number of Stairs 4   Gait Comments short shuffled steps, rounded back, head forward     Standardized Balance Assessment   Five times sit to stand comments  able to do without UE use     Berg Balance Test   Sit to Stand Able to stand without using hands and stabilize independently   Standing Unsupported Able to stand safely 2 minutes   Sitting with Back Unsupported but Feet Supported on Floor or Stool Able to sit safely and securely 2 minutes   Stand to Sit Sits safely with minimal use of hands   Transfers Able to  transfer safely, minor use of hands   Standing Unsupported with Eyes Closed Able to stand 10 seconds with supervision   Standing Ubsupported with Feet Together Able  to place feet together independently but unable to hold for 30 seconds   From Standing, Reach Forward with Outstretched Arm Reaches forward but needs supervision   From Standing Position, Pick up Object from [place] to pick up shoe, needs supervision   From Standing Position, Turn to Look Behind Over each Shoulder Looks behind one side only/other side shows less weight shift   Turn 360 Degrees Able to turn 360 degrees safely but slowly   Standing Unsupported, Alternately Place Feet on Step/Stool Able to complete >2 steps/needs minimal assist   Standing Unsupported, One Foot in [place] to take small step independently and hold 30 seconds   Standing on One Leg Tries to lift leg/unable to hold 3 seconds but remains standing independently   Total Score 38   Berg comment: significant 80% risk for falls  recommend full time use of walker discussed with patient      Timed Up and Go Test   TUG Manual TUG   Normal TUG (seconds) 18   TUG Comments > 14 secs high fall risk            Objective measurements completed on examination: See above findings.                  PT Education - 03/05/17 1525    Education provided Yes   Education Details full time walker recommendation;  sit to stand ex   Person(s) Educated Patient   Methods Explanation;Demonstration;Handout   Comprehension Verbalized understanding;Returned demonstration          PT Short Term Goals - 03/05/17 1546      PT SHORT TERM GOAL #1   Title Pt will be independent in initial HEP   Time 4   Period Weeks   Status New   [place] Date 04/02/17     PT SHORT TERM GOAL #2   Title Improved bil HS length to 65 degrees and hip flexor lengths to 5 degrees needed for improved step length   Time 4   Period Weeks     PT SHORT TERM GOAL #3   Title  Pt will demo improved Berg to 40/56 for decreased falls risk   Time 4   Period Weeks   Status New           PT Long Term Goals - 03/05/17 1548      PT LONG TERM GOAL #1   Title Pt will demo improved Berg to >/= 43/56 for decreased falls risk   Time 8   Period Weeks   Status New   Target Date 04/30/17     PT LONG TERM GOAL #2   Title The patient will be independent in safe self progression of HEP for futher improvements in balance   Time 8   Period Weeks   Status New     PT LONG TERM GOAL #3   Title Pt will demo improved bil LE strength grossly 4/5 to assist with more stability with gait   Time 8   Period Weeks   Status New     PT LONG TERM GOAL #4   Title Pt will demo improved stability with ambulation with RW or least restrictive assistive device level/unlevel surfaces to assist with out of home outings   Time 8   Period Weeks   Status New     PT LONG TERM GOAL #5   Title Timed up and Go to 14 sec indicated improved gait velocity and safety for  community ambulation   Time 8   Period Weeks   Status New                Plan - 03/05/17 1527    Clinical Impression Statement The patient is an 83 year old male referred to PT for increased risk of falls/balance dysfunction.  He was seen in outpatient PT until June.  Following that he was hospitalized with acute DVT and pulmondary embolus.  He has been receiving HHPT, nursing and speech therapy until a week ago.  He presents without an assistive device but has a cane and rolling walker at home.  ROM is WFLS in UEs and LEs although decreased bilateral hip flexor and hamstring muscle lengths.  Decreased bil LE strength grossly 4-/5.  He is able to rise from a standard chair without UE use.  BERG balance score indicates an 80% risk of falls and I recommend full time use of walker at this time.  His previous score from discharge from outpatient therapy in June was 36/56. Timed up and go test indicates a high risk of falls.   Short shuffled steps and kyphotic posture with ambulation.   He would benefit from PT to address these deficits to prevent a further decline in functional status.     History and Personal Factors relevant to plan of care: good home support;  numerous co-morbidities   Clinical Presentation Stable   Clinical Presentation due to: peripheral neuropathy, acute DVT and PE, hx of TIA, HTN, hard of hearing,  chronic LBP   Clinical Decision Making Low   Rehab Potential Good   Clinical Impairments Affecting Rehab Potential  peripheral neuropathy   PT Frequency 2x / week   PT Duration 8 weeks   PT Treatment/Interventions ADLs/Self Care Home Management;Functional mobility training;Stair training;Gait training;DME Instruction;Therapeutic activities;Therapeutic exercise;Balance training;Neuromuscular re-education;Patient/family education;Manual techniques   PT Next Visit Plan standing balance ex;  LE strengthening;  encourage use of walker for safety;  stretch HS and hip flexors;  work on upright posture;  KX now secondary to previous PT this year      Patient will benefit from skilled therapeutic intervention in order to improve the following deficits and impairments:  Abnormal gait, Decreased balance, Decreased safety awareness, Decreased strength, Difficulty walking, Impaired flexibility, Postural dysfunction, Pain  Visit Diagnosis: Weakness generalized - Plan: PT plan of care cert/re-cert  Unsteadiness on feet - Plan: PT plan of care cert/re-cert  Difficulty in walking, not elsewhere classified - Plan: PT plan of care cert/re-cert      G-Codes - 29/79/89 1551    Functional Assessment Tool Used (Outpatient Only) BERG; clinical judgement    Functional Limitation Mobility: Walking and moving around   Mobility: Walking and Moving Around Current Status ([id number]) At least 40 percent but less than 60 percent impaired, limited or restricted   Mobility: Walking and Moving Around Goal Status [phone]) At least  20 percent but less than 40 percent impaired, limited or restricted       Problem List Patient Active Problem List   Diagnosis Date Noted  . Neck pain   . Acute deep vein thrombosis (DVT) of proximal vein of lower extremity (HCC)   . Bilateral pulmonary embolism ([place]) 01/08/2017  . Abnormality of gait 11/05/2013  . OSA (obstructive sleep apnea) 07/23/2013  . Erectile dysfunction 07/23/2013  . CAD (coronary artery disease), Hx of LAD DES in 2005 04/15/2013  . Peripheral neuropathy 04/15/2013  . Hyperlipidemia with target LDL less than 70 04/15/2013  Ruben Im, PT 03/05/17 3:54 PM Phone: [phone] Fax: [phone]  Alvera Singh 03/05/2017, 3:53 PM  [facility] [address] [place], [place], [address] Phone: [phone]   Fax:  [phone]  Name: Corey Larson. MRN: [id number] Date of Birth: 1928-03-04

## 2017-03-05 NOTE — Patient Instructions (Signed)
     Recommend full time use of rolling walker for safety.

## 2017-03-07 ENCOUNTER — Ambulatory Visit: Payer: Medicare Other | Admitting: Physical Therapy

## 2017-03-07 DIAGNOSIS — M545 Low back pain: Secondary | ICD-10-CM

## 2017-03-07 DIAGNOSIS — R262 Difficulty in walking, not elsewhere classified: Secondary | ICD-10-CM | POA: Diagnosis not present

## 2017-03-07 DIAGNOSIS — R2681 Unsteadiness on feet: Secondary | ICD-10-CM

## 2017-03-07 DIAGNOSIS — G8929 Other chronic pain: Secondary | ICD-10-CM

## 2017-03-07 DIAGNOSIS — R531 Weakness: Secondary | ICD-10-CM | POA: Diagnosis not present

## 2017-03-07 NOTE — Therapy (Signed)
[facility] [address] [place], [place], [address] Phone: [phone]   Fax:  [phone]  Physical Therapy Treatment  Patient Details  Name: Corey Larson. MRN: [id number] Date of Birth: 16-Dec-1927 Referring Provider: Dr. [name]  Encounter Date: 03/07/2017      PT End of Session - 03/07/17 1436    Visit Number 2   Number of Visits 10   Date for PT Re-Evaluation May 18, 2017   Authorization Type G codes;  KX now because of previous PT   PT Start Time 1400   PT Stop Time 1440   PT Time Calculation (min) 40 min   Activity Tolerance Patient tolerated treatment well      Past Medical History:  Diagnosis Date  . Abnormality of gait 11/05/2013  . CAD (coronary artery disease) 2005   stent to LAD by Dr. Glade Lloyd. mild irregulatities of RCA.    [name] CRF (chronic renal failure)   . ED (erectile dysfunction)   . H/O exercise stress test 05/2012   no statistically significant ischemia.   [name] History of TIA (transient ischemic attack)    transient confusion  . HOH (hard of hearing)    hearing aids  . Hyperlipidemia LDL goal < 70   . Hypertension   . Insomnia   . Macular degeneration    bilateral  . Macular degeneration    Right>left  . NSTEMI (non-ST elevated myocardial infarction) ([place]) 04/16/2013  . OSA (obstructive sleep apnea)    mild, never on cpap  . Peripheral neuropathy   . Vitamin D deficiency     Past Surgical History:  Procedure Laterality Date  . ANKLE SURGERY     right ankle 2002 fracture  . BACK SURGERY  1976   laminectomy  . CARDIAC CATHETERIZATION     2005, stent placed DES LAD  . CATARACT EXTRACTION     bilateral  . CORONARY ANGIOPLASTY    . HERNIA REPAIR  1981   "double hernia"  . KNEE SURGERY     right knee 2011   . LEFT HEART CATHETERIZATION WITH CORONARY ANGIOGRAM N/A 04/16/2013   Procedure: LEFT HEART CATHETERIZATION WITH CORONARY ANGIOGRAM;  Surgeon: Leonie Man, MD;   Location: [facility] CATH LAB;  Service: Cardiovascular;  Laterality: N/A;  . NM MYOCAR MULTIPLE W/SPECT  05/13/2012   MILD UPPER SEPTAL THINNING AND DIAPHRAGMATIC ATTENUATION W/O SIGNIFICANT ISCHEMIA. EF 66%.  . TRANSTHORACIC ECHOCARDIOGRAM  05/12/2010   EF =>55%. MILD CONCENTRIC LVH. TRACE MITRAL REGURG.    There were no vitals filed for this visit.      Subjective Assessment - 03/07/17 1402    Subjective "No pain, just a tired body"  Reports fatigue following initial evaluation.     Currently in Pain? No/denies                         [facility] Adult PT Treatment/Exercise - 03/07/17 0001      Therapeutic Activites    Therapeutic Activities Other Therapeutic Activities   Other Therapeutic Activities sit to stand, standing, upright posture     Neuro Re-ed    Neuro Re-ed Details  dynamic and static standing balance     Lumbar Exercises: Seated   Other Seated Lumbar Exercises 2# plyo ball hip to hip and shoulder to hip 10x each     Knee/Hip Exercises: Stretches   Active Hamstring Stretch Right;Left;5 reps   Active Hamstring Stretch Limitations seated with foot  stool   Other Knee/Hip Stretches --     Knee/Hip Exercises: Aerobic   Nustep L1 7 min      Knee/Hip Exercises: Standing   Terminal Knee Extension Limitations shoulder presses into wall 7x   Hip Abduction AROM;Right;Left;10 reps   Hip Extension AROM;Right;Left;10 reps   Stairs step taps single arm support 10x   Other Standing Knee Exercises wall climbs 10x   Other Standing Knee Exercises step and reach 5x forward and 5x lateral     Knee/Hip Exercises: Seated   Heel Slides Limitations thoracic extension with ball 15x   Ball Squeeze 15x  with foam roll to cue posture   Clamshell with TheraBand Green  with foam to cue posture   Other Seated Knee/Hip Exercises foam roll push down 10x   Other Seated Knee/Hip Exercises turn extension and rotation 3x each side   Sit to Sand 5 reps;without UE support                   PT Short Term Goals - 03/07/17 1547      PT SHORT TERM GOAL #1   Title Pt will be independent in initial HEP   Time 4   Period Weeks   Status On-going     PT SHORT TERM GOAL #2   Title Improved bil HS length to 65 degrees and hip flexor lengths to 5 degrees needed for improved step length   Time 4   Period Weeks   Status On-going     PT SHORT TERM GOAL #3   Title Pt will demo improved Berg to 40/56 for decreased falls risk   Time 4   Period Weeks   Status On-going           PT Long Term Goals - 03/07/17 1548      PT LONG TERM GOAL #1   Title Pt will demo improved Berg to >/= 43/56 for decreased falls risk   Time 8   Period Weeks   Status On-going     PT LONG TERM GOAL #2   Title The patient will be independent in safe self progression of HEP for futher improvements in balance   Time 8   Period Weeks   Status On-going     PT LONG TERM GOAL #3   Title Pt will demo improved bil LE strength grossly 4/5 to assist with more stability with gait   Time 8   Period Weeks   Status On-going     PT LONG TERM GOAL #4   Title Pt will demo improved stability with ambulation with RW or least restrictive assistive device level/unlevel surfaces to assist with out of home outings   Time 8   Period Weeks   Status On-going     PT LONG TERM GOAL #5   Title Timed up and Go to 14 sec indicated improved gait velocity and safety for community ambulation   Time 8   Period Weeks   Status On-going               Plan - 03/07/17 1437    Clinical Impression Statement "That was a pretty good workout today."  The patient needs multiple sitting breaks secondary to reports of low back pain with standing.  He has mild shortness of breath but is able to converse throughout session. Back pain relieved with sitting.   Close supervision to monitor response and for safety.  Cues for erect posture.  Patient not using assistive device  as previously recommended.      Rehab Potential Good   Clinical Impairments Affecting Rehab Potential  peripheral neuropathy   PT Frequency 2x / week   PT Duration 8 weeks   PT Treatment/Interventions ADLs/Self Care Home Management;Functional mobility training;Stair training;Gait training;DME Instruction;Therapeutic activities;Therapeutic exercise;Balance training;Neuromuscular re-education;Patient/family education;Manual techniques   PT Next Visit Plan standing balance ex;  LE strengthening;  encourage use of walker for safety;  stretch HS and hip flexors;  work on upright posture;  KX now secondary to previous PT this year      Patient will benefit from skilled therapeutic intervention in order to improve the following deficits and impairments:  Abnormal gait, Decreased balance, Decreased safety awareness, Decreased strength, Difficulty walking, Impaired flexibility, Postural dysfunction, Pain  Visit Diagnosis: Weakness generalized  Unsteadiness on feet  Difficulty in walking, not elsewhere classified  Chronic bilateral low back pain without sciatica     Problem List Patient Active Problem List   Diagnosis Date Noted  . Neck pain   . Acute deep vein thrombosis (DVT) of proximal vein of lower extremity (HCC)   . Bilateral pulmonary embolism ([place]) 01/08/2017  . Abnormality of gait 11/05/2013  . OSA (obstructive sleep apnea) 07/23/2013  . Erectile dysfunction 07/23/2013  . CAD (coronary artery disease), Hx of LAD DES in 2005 04/15/2013  . Peripheral neuropathy 04/15/2013  . Hyperlipidemia with target LDL less than 70 04/15/2013   Ruben Im, PT 03/07/17 3:50 PM Phone: [phone] Fax: [phone]  Alvera Singh 03/07/2017, 3:49 PM  [facility] [address], [place], [place], [address] Phone: [phone]   Fax:  [phone]  Name: Corey Larson. MRN: [id number] Date of Birth: [birth year]

## 2017-03-11 ENCOUNTER — Encounter: Payer: Self-pay | Admitting: Physical Therapy

## 2017-03-11 ENCOUNTER — Ambulatory Visit: Payer: Medicare Other | Admitting: Physical Therapy

## 2017-03-11 DIAGNOSIS — R2681 Unsteadiness on feet: Secondary | ICD-10-CM | POA: Diagnosis not present

## 2017-03-11 DIAGNOSIS — R531 Weakness: Secondary | ICD-10-CM | POA: Diagnosis not present

## 2017-03-11 DIAGNOSIS — G8929 Other chronic pain: Secondary | ICD-10-CM

## 2017-03-11 DIAGNOSIS — M545 Low back pain: Secondary | ICD-10-CM | POA: Diagnosis not present

## 2017-03-11 DIAGNOSIS — R262 Difficulty in walking, not elsewhere classified: Secondary | ICD-10-CM | POA: Diagnosis not present

## 2017-03-11 NOTE — Therapy (Signed)
[facility] [address], [place], [place], [address] Phone: [phone]   Fax:  [phone]  Physical Therapy Treatment  Patient Details  Name: Corey Larson. MRN: [id number] Date of Birth: May 26, 1928 Referring Provider: Dr. [name]  Encounter Date: 03/11/2017      PT End of Session - 03/11/17 1056    Visit Number 3   Number of Visits 10   Date for PT Re-Evaluation May 15, 2017   Authorization Type G codes;  KX now because of previous PT   PT Start Time 1053   PT Stop Time 1135   PT Time Calculation (min) 42 min   Activity Tolerance Patient tolerated treatment well   Behavior During Therapy [facility] for tasks assessed/performed      Past Medical History:  Diagnosis Date  . Abnormality of gait 11/05/2013  . CAD (coronary artery disease) 2005   stent to LAD by Dr. Glade Lloyd. mild irregulatities of RCA.    [name] CRF (chronic renal failure)   . ED (erectile dysfunction)   . H/O exercise stress test 05/2012   no statistically significant ischemia.   [name] History of TIA (transient ischemic attack)    transient confusion  . HOH (hard of hearing)    hearing aids  . Hyperlipidemia LDL goal < 70   . Hypertension   . Insomnia   . Macular degeneration    bilateral  . Macular degeneration    Right>left  . NSTEMI (non-ST elevated myocardial infarction) ([place]) 04/16/2013  . OSA (obstructive sleep apnea)    mild, never on cpap  . Peripheral neuropathy   . Vitamin D deficiency     Past Surgical History:  Procedure Laterality Date  . ANKLE SURGERY     right ankle 2002 fracture  . BACK SURGERY  1976   laminectomy  . CARDIAC CATHETERIZATION     2005, stent placed DES LAD  . CATARACT EXTRACTION     bilateral  . CORONARY ANGIOPLASTY    . HERNIA REPAIR  1981   "double hernia"  . KNEE SURGERY     right knee 2011   . LEFT HEART CATHETERIZATION WITH CORONARY ANGIOGRAM N/A 04/16/2013   Procedure: LEFT HEART CATHETERIZATION  WITH CORONARY ANGIOGRAM;  Surgeon: Leonie Man, MD;  Location: [facility] CATH LAB;  Service: Cardiovascular;  Laterality: N/A;  . NM MYOCAR MULTIPLE W/SPECT  05/13/2012   MILD UPPER SEPTAL THINNING AND DIAPHRAGMATIC ATTENUATION W/O SIGNIFICANT ISCHEMIA. EF 66%.  . TRANSTHORACIC ECHOCARDIOGRAM  05/12/2010   EF =>55%. MILD CONCENTRIC LVH. TRACE MITRAL REGURG.    There were no vitals filed for this visit.      Subjective Assessment - 03/11/17 1058    Subjective I did not sleep well last night, I feel a little tired. Ambulates without device today. "I don't really need my cane."   Currently in Pain? No/denies   Multiple Pain Sites No                         [facility] Adult PT Treatment/Exercise - 03/11/17 0001      Self-Care   Self-Care --  Safety upon standing: stand and pause, stand tall, then walk     Neuro Re-ed    Neuro Re-ed Details  dynamic and static standing balance  level & unlevel surface     Lumbar Exercises: Seated   Other Seated Lumbar Exercises 2# plyo ball hip to hip and shoulder to hip 10x each  noodle behind back for thoracic ext     Knee/Hip Exercises: Stretches   Active Hamstring Stretch Both;3 reps;10 seconds     Knee/Hip Exercises: Aerobic   Nustep L1 x 8 min     Knee/Hip Exercises: Standing   Heel Raises Both;20 reps  UE asst    Hip Flexion AROM;Stengthening;Both;20 reps;Knee bent   Hip Abduction AROM;Stengthening;Both;2 sets;20 reps;Knee straight   Hip Extension AROM;Stengthening;Both;20 reps;Knee straight   Stairs step taps single arm support 10x   Other Standing Knee Exercises wall climbs 10x     Knee/Hip Exercises: Seated   Long Arc Quad AROM;Strengthening;Both;2 sets;10 reps;Weights  noodle behind back for thoracic ext   Long Arc Quad Weight 2 lbs.   Ball Squeeze 20x  foam roll under seat   Clamshell with TheraBand Green  with foam to cue posture     Shoulder Exercises: Pulleys   Flexion 3 minutes  With noodle for thoracic  ext/postural focus                  PT Short Term Goals - 03/11/17 1126      PT SHORT TERM GOAL #1   Title Pt will be independent in initial HEP   Time 4   Period Weeks   Status Partially Met           PT Long Term Goals - 03/07/17 1548      PT LONG TERM GOAL #1   Title Pt will demo improved Berg to >/= 43/56 for decreased falls risk   Time 8   Period Weeks   Status On-going     PT LONG TERM GOAL #2   Title The patient will be independent in safe self progression of HEP for futher improvements in balance   Time 8   Period Weeks   Status On-going     PT LONG TERM GOAL #3   Title Pt will demo improved bil LE strength grossly 4/5 to assist with more stability with gait   Time 8   Period Weeks   Status On-going     PT LONG TERM GOAL #4   Title Pt will demo improved stability with ambulation with RW or least restrictive assistive device level/unlevel surfaces to assist with out of home outings   Time 8   Period Weeks   Status On-going     PT LONG TERM GOAL #5   Title Timed up and Go to 14 sec indicated improved gait velocity and safety for community ambulation   Time 8   Period Weeks   Status On-going               Plan - 03/11/17 1057    Rehab Potential Good   Clinical Impairments Affecting Rehab Potential  peripheral neuropathy   PT Frequency 2x / week   PT Duration 8 weeks   PT Treatment/Interventions ADLs/Self Care Home Management;Functional mobility training;Stair training;Gait training;DME Instruction;Therapeutic activities;Therapeutic exercise;Balance training;Neuromuscular re-education;Patient/family education;Manual techniques   Consulted and Agree with Plan of Care Patient      Patient will benefit from skilled therapeutic intervention in order to improve the following deficits and impairments:  Abnormal gait, Decreased balance, Decreased safety awareness, Decreased strength, Difficulty walking, Impaired flexibility, Postural  dysfunction, Pain  Visit Diagnosis: Weakness generalized  Unsteadiness on feet  Difficulty in walking, not elsewhere classified  Chronic bilateral low back pain without sciatica     Problem List Patient Active Problem List   Diagnosis Date Noted  . Neck pain   .  Acute deep vein thrombosis (DVT) of proximal vein of lower extremity (HCC)   . Bilateral pulmonary embolism ([place]) 01/08/2017  . Abnormality of gait 11/05/2013  . OSA (obstructive sleep apnea) 07/23/2013  . Erectile dysfunction 07/23/2013  . CAD (coronary artery disease), Hx of LAD DES in 2005 04/15/2013  . Peripheral neuropathy 04/15/2013  . Hyperlipidemia with target LDL less than 70 04/15/2013    Arisha Gervais, PTA 03/11/2017, 11:37 AM  [facility] [address] [place], [place], [address] Phone: [phone]   Fax:  [phone]  Name: Joah Patlan. MRN: [id number] Date of Birth: 1927/09/07

## 2017-03-13 ENCOUNTER — Ambulatory Visit: Payer: Medicare Other | Admitting: Physical Therapy

## 2017-03-13 ENCOUNTER — Encounter: Payer: Self-pay | Admitting: Physical Therapy

## 2017-03-13 DIAGNOSIS — R2681 Unsteadiness on feet: Secondary | ICD-10-CM | POA: Diagnosis not present

## 2017-03-13 DIAGNOSIS — R262 Difficulty in walking, not elsewhere classified: Secondary | ICD-10-CM

## 2017-03-13 DIAGNOSIS — R531 Weakness: Secondary | ICD-10-CM | POA: Diagnosis not present

## 2017-03-13 DIAGNOSIS — G8929 Other chronic pain: Secondary | ICD-10-CM

## 2017-03-13 DIAGNOSIS — M545 Low back pain: Secondary | ICD-10-CM | POA: Diagnosis not present

## 2017-03-13 NOTE — Therapy (Signed)
[facility] [address] [place], [place], [address] Phone: [phone]   Fax:  [phone]  Physical Therapy Treatment  Patient Details  Name: Corey Larson. MRN: [id number] Date of Birth: [birth year] Referring Provider: Dr. [name]  Encounter Date: 03/13/2017      PT End of Session - 03/13/17 0907    Visit Number 4   Number of Visits 10   Date for PT Re-Evaluation 05-28-17   Authorization Type G codes;  KX now because of previous PT   PT Start Time 0846   PT Stop Time 0925   PT Time Calculation (min) 39 min   Activity Tolerance Patient tolerated treatment well  Very fatigued near end of session   Behavior During Therapy [facility] for tasks assessed/performed      Past Medical History:  Diagnosis Date  . Abnormality of gait 11/05/2013  . CAD (coronary artery disease) 2005   stent to LAD by Dr. Glade Lloyd. mild irregulatities of RCA.    [name] CRF (chronic renal failure)   . ED (erectile dysfunction)   . H/O exercise stress test 05/2012   no statistically significant ischemia.   [name] History of TIA (transient ischemic attack)    transient confusion  . HOH (hard of hearing)    hearing aids  . Hyperlipidemia LDL goal < 70   . Hypertension   . Insomnia   . Macular degeneration    bilateral  . Macular degeneration    Right>left  . NSTEMI (non-ST elevated myocardial infarction) ([place]) 04/16/2013  . OSA (obstructive sleep apnea)    mild, never on cpap  . Peripheral neuropathy   . Vitamin D deficiency     Past Surgical History:  Procedure Laterality Date  . ANKLE SURGERY     right ankle 2002 fracture  . BACK SURGERY  1976   laminectomy  . CARDIAC CATHETERIZATION     2005, stent placed DES LAD  . CATARACT EXTRACTION     bilateral  . CORONARY ANGIOPLASTY    . HERNIA REPAIR  1981   "double hernia"  . KNEE SURGERY     right knee 2011   . LEFT HEART CATHETERIZATION WITH CORONARY ANGIOGRAM N/A 04/16/2013    Procedure: LEFT HEART CATHETERIZATION WITH CORONARY ANGIOGRAM;  Surgeon: Leonie Man, MD;  Location: [facility] CATH LAB;  Service: Cardiovascular;  Laterality: N/A;  . NM MYOCAR MULTIPLE W/SPECT  05/13/2012   MILD UPPER SEPTAL THINNING AND DIAPHRAGMATIC ATTENUATION W/O SIGNIFICANT ISCHEMIA. EF 66%.  . TRANSTHORACIC ECHOCARDIOGRAM  05/12/2010   EF =>55%. MILD CONCENTRIC LVH. TRACE MITRAL REGURG.    There were no vitals filed for this visit.      Subjective Assessment - 03/13/17 0853    Subjective I was ok after last session. A little tired, "I could tel lI did something."    Currently in Pain? No/denies   Multiple Pain Sites No                         [facility] Adult PT Treatment/Exercise - 03/13/17 0001      Ambulation/Gait   Ambulation/Gait Yes   Gait Comments Increasing stride length, posture on level ground     Neuro Re-ed    Neuro Re-ed Details  dynamic and static standing balance  level & unlevel surface     Knee/Hip Exercises: Stretches   Active Hamstring Stretch Both;3 reps;10 seconds     Knee/Hip Exercises: Aerobic  Nustep L1 x 8 min  Monitored      Knee/Hip Exercises: Standing   Heel Raises Both;20 reps  UE asst    Knee Flexion AROM;Both;20 reps   Hip Abduction AROM;Stengthening;Both;2 sets;20 reps;Knee straight     Knee/Hip Exercises: Seated   Long Arc Quad AROM;Strengthening;Both;2 sets;10 reps;Weights  noodle behind back for thoracic ext   Long Arc Quad Weight 2 lbs.   Ball Squeeze 25x   Clamshell with Marga Hoots  with foam to cue posture   Knee/Hip Flexion 2# marching 20x  Noodle behind pt                  PT Short Term Goals - 03/11/17 1126      PT SHORT TERM GOAL #1   Title Pt will be independent in initial HEP   Time 4   Period Weeks   Status Partially Met           PT Long Term Goals - 03/07/17 1548      PT LONG TERM GOAL #1   Title Pt will demo improved Berg to >/= 43/56 for decreased falls risk   Time 8    Period Weeks   Status On-going     PT LONG TERM GOAL #2   Title The patient will be independent in safe self progression of HEP for futher improvements in balance   Time 8   Period Weeks   Status On-going     PT LONG TERM GOAL #3   Title Pt will demo improved bil LE strength grossly 4/5 to assist with more stability with gait   Time 8   Period Weeks   Status On-going     PT LONG TERM GOAL #4   Title Pt will demo improved stability with ambulation with RW or least restrictive assistive device level/unlevel surfaces to assist with out of home outings   Time 8   Period Weeks   Status On-going     PT LONG TERM GOAL #5   Title Timed up and Go to 14 sec indicated improved gait velocity and safety for community ambulation   Time 8   Period Weeks   Status On-going               Plan - 03/13/17 0905    Clinical Impression Statement Pt ambulates with short, choppy steps today entering the gym. When verbally cued and worked on as part of an exercise pt can stand more erect and take a longer stride. He demonstrates almost a Parkonsonian gait with shuffle feet as he fatigues during the PT session.   Again, he presents with no asst device, was encouraged to use more often out of the home.    Rehab Potential Good   Clinical Impairments Affecting Rehab Potential  peripheral neuropathy   PT Frequency 2x / week   PT Duration 8 weeks   PT Treatment/Interventions ADLs/Self Care Home Management;Functional mobility training;Stair training;Gait training;DME Instruction;Therapeutic activities;Therapeutic exercise;Balance training;Neuromuscular re-education;Patient/family education;Manual techniques   PT Next Visit Plan standing balance ex;  LE strengthening; stretch HS and hip flexors;  work on upright posture;  KX now secondary to previous PT this year   Consulted and Agree with Plan of Care Patient      Patient will benefit from skilled therapeutic intervention in order to improve the  following deficits and impairments:  Abnormal gait, Decreased balance, Decreased safety awareness, Decreased strength, Difficulty walking, Impaired flexibility, Postural dysfunction, Pain  Visit Diagnosis: Weakness generalized  Unsteadiness on feet  Difficulty in walking, not elsewhere classified  Chronic bilateral low back pain without sciatica     Problem List Patient Active Problem List   Diagnosis Date Noted  . Neck pain   . Acute deep vein thrombosis (DVT) of proximal vein of lower extremity (HCC)   . Bilateral pulmonary embolism ([place]) 01/08/2017  . Abnormality of gait 11/05/2013  . OSA (obstructive sleep apnea) 07/23/2013  . Erectile dysfunction 07/23/2013  . CAD (coronary artery disease), Hx of LAD DES in 2005 04/15/2013  . Peripheral neuropathy 04/15/2013  . Hyperlipidemia with target LDL less than 70 04/15/2013    Svea Pusch, PTA 03/13/2017, 9:24 AM  [facility] [address], [place], [place], [address] Phone: [phone]   Fax:  [phone]  Name: Carder Yin. MRN: [id number] Date of Birth: 1927/06/28

## 2017-03-19 ENCOUNTER — Ambulatory Visit: Payer: Medicare Other | Admitting: Physical Therapy

## 2017-03-19 DIAGNOSIS — G8929 Other chronic pain: Secondary | ICD-10-CM | POA: Diagnosis not present

## 2017-03-19 DIAGNOSIS — M545 Low back pain, unspecified: Secondary | ICD-10-CM

## 2017-03-19 DIAGNOSIS — R262 Difficulty in walking, not elsewhere classified: Secondary | ICD-10-CM | POA: Diagnosis not present

## 2017-03-19 DIAGNOSIS — R531 Weakness: Secondary | ICD-10-CM | POA: Diagnosis not present

## 2017-03-19 DIAGNOSIS — R2681 Unsteadiness on feet: Secondary | ICD-10-CM | POA: Diagnosis not present

## 2017-03-19 NOTE — Therapy (Signed)
[facility] [address] [place], [place], [address] Phone: [phone]   Fax:  [phone]  Physical Therapy Treatment  Patient Details  Name: Corey Larson. MRN: [id number] Date of Birth: February 16, 1928 Referring Provider: Dr. [name]  Encounter Date: 03/19/2017      PT End of Session - 03/19/17 1129    Visit Number 5   Number of Visits 10   Date for PT Re-Evaluation 15-May-2017   Authorization Type G codes;  KX now because of previous PT   PT Start Time 1100   PT Stop Time 1140   PT Time Calculation (min) 40 min   Activity Tolerance Patient tolerated treatment well      Past Medical History:  Diagnosis Date  . Abnormality of gait 11/05/2013  . CAD (coronary artery disease) 2005   stent to LAD by Dr. Glade Lloyd. mild irregulatities of RCA.    [name] CRF (chronic renal failure)   . ED (erectile dysfunction)   . H/O exercise stress test 05/2012   no statistically significant ischemia.   [name] History of TIA (transient ischemic attack)    transient confusion  . HOH (hard of hearing)    hearing aids  . Hyperlipidemia LDL goal < 70   . Hypertension   . Insomnia   . Macular degeneration    bilateral  . Macular degeneration    Right>left  . NSTEMI (non-ST elevated myocardial infarction) ([place]) 04/16/2013  . OSA (obstructive sleep apnea)    mild, never on cpap  . Peripheral neuropathy   . Vitamin D deficiency     Past Surgical History:  Procedure Laterality Date  . ANKLE SURGERY     right ankle 2002 fracture  . BACK SURGERY  1976   laminectomy  . CARDIAC CATHETERIZATION     2005, stent placed DES LAD  . CATARACT EXTRACTION     bilateral  . CORONARY ANGIOPLASTY    . HERNIA REPAIR  1981   "double hernia"  . KNEE SURGERY     right knee 2011   . LEFT HEART CATHETERIZATION WITH CORONARY ANGIOGRAM N/A 04/16/2013   Procedure: LEFT HEART CATHETERIZATION WITH CORONARY ANGIOGRAM;  Surgeon: Leonie Man, MD;   Location: [facility] CATH LAB;  Service: Cardiovascular;  Laterality: N/A;  . NM MYOCAR MULTIPLE W/SPECT  05/13/2012   MILD UPPER SEPTAL THINNING AND DIAPHRAGMATIC ATTENUATION W/O SIGNIFICANT ISCHEMIA. EF 66%.  . TRANSTHORACIC ECHOCARDIOGRAM  05/12/2010   EF =>55%. MILD CONCENTRIC LVH. TRACE MITRAL REGURG.    There were no vitals filed for this visit.      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  No pain today.  States he did a lot of stretching last time and then he drove 240 miles.  My balance is still pretty wobbly especially with one foot activities.  No assistive device used today.     Patient Stated Goals balance get better;  wedding at the end of the month;  hopes to go to [place] in Jan/Feb;  play golf   Currently in Pain? No/denies                         [facility] Adult PT Treatment/Exercise - 03/19/17 0001      Therapeutic Activites    Other Therapeutic Activities sit to stand, standing, upright posture     Neuro Re-ed    Neuro  Re-ed Details  dynamic and static standing balance     Lumbar Exercises: Seated   Sit to Stand Limitations thoracic extension with ball 10x   Other Seated Lumbar Exercises trunk rotation stretch and overhead reach 2x     Knee/Hip Exercises: Stretches   Active Hamstring Stretch Right;Left;5 reps   Active Hamstring Stretch Limitations seated with foot stool   Other Knee/Hip Stretches doorway calf stretch 5x  patient unable to follow directions for psoas stretch     Knee/Hip Exercises: Aerobic   Nustep L1 x 10 min  Monitored and discussion of progression     Knee/Hip Exercises: Standing   Heel Raises Both;20 reps  UE asst    Stairs step taps single arm support 10x   Gait Training beach ball toss with mild balance challenges  narrow base of support and staggered foot positions   Other Standing Knee Exercises wall climbs 10x   Other Standing Knee Exercises step and  reach 5x forward and 5x lateral     Knee/Hip Exercises: Seated   Clamshell with TheraBand Green  with foam to cue posture   Other Seated Knee/Hip Exercises foam roll push down 10x   Sit to Sand 5 reps;without UE support                  PT Short Term Goals - 03/11/17 1126      PT SHORT TERM GOAL #1   Title Pt will be independent in initial HEP   Time 4   Period Weeks   Status Partially Met           PT Long Term Goals - 03/07/17 1548      PT LONG TERM GOAL #1   Title Pt will demo improved Berg to >/= 43/56 for decreased falls risk   Time 8   Period Weeks   Status On-going     PT LONG TERM GOAL #2   Title The patient will be independent in safe self progression of HEP for futher improvements in balance   Time 8   Period Weeks   Status On-going     PT LONG TERM GOAL #3   Title Pt will demo improved bil LE strength grossly 4/5 to assist with more stability with gait   Time 8   Period Weeks   Status On-going     PT LONG TERM GOAL #4   Title Pt will demo improved stability with ambulation with RW or least restrictive assistive device level/unlevel surfaces to assist with out of home outings   Time 8   Period Weeks   Status On-going     PT LONG TERM GOAL #5   Title Timed up and Go to 14 sec indicated improved gait velocity and safety for community ambulation   Time 8   Period Weeks   Status On-going               Plan - 03/19/17 1130    Clinical Impression Statement The patient has some difficulty following 2 step and more complex directions.  Heavier breathing and sighing with both sitting and standing exercises.  Needs seated rest breaks between 3 minutes of standing exercise.  Encouraged use of cane or RW at home and in the community but patient states he doesn't want to use these devices.  On track to meet STGS by 10/30.     Rehab Potential Good   Clinical Impairments Affecting Rehab Potential  peripheral neuropathy   PT Frequency 2x /  week    PT Duration 8 weeks   PT Treatment/Interventions ADLs/Self Care Home Management;Functional mobility training;Stair training;Gait training;DME Instruction;Therapeutic activities;Therapeutic exercise;Balance training;Neuromuscular re-education;Patient/family education;Manual techniques   PT Next Visit Plan standing balance ex;  LE strengthening; stretch HS and hip flexors;  work on upright posture;  KX now secondary to previous PT this year;  Patient having a root canal later this week.      Patient will benefit from skilled therapeutic intervention in order to improve the following deficits and impairments:  Abnormal gait, Decreased balance, Decreased safety awareness, Decreased strength, Difficulty walking, Impaired flexibility, Postural dysfunction, Pain  Visit Diagnosis: Weakness generalized  Unsteadiness on feet  Difficulty in walking, not elsewhere classified  Chronic bilateral low back pain without sciatica     Problem List Patient Active Problem List   Diagnosis Date Noted  . Neck pain   . Acute deep vein thrombosis (DVT) of proximal vein of lower extremity (HCC)   . Bilateral pulmonary embolism ([place]) 01/08/2017  . Abnormality of gait 11/05/2013  . OSA (obstructive sleep apnea) 07/23/2013  . Erectile dysfunction 07/23/2013  . CAD (coronary artery disease), Hx of LAD DES in 2005 04/15/2013  . Peripheral neuropathy 04/15/2013  . Hyperlipidemia with target LDL less than 70 04/15/2013   Ruben Im, PT 03/19/17 11:48 AM Phone: [phone] Fax: [phone]  Alvera Singh 03/19/2017, 11:47 AM  [facility] [address] [place], [place], [address] Phone: [phone]   Fax:  [phone]  Name: Corey Larson. MRN: [id number] Date of Birth: 1928-01-07

## 2017-03-19 NOTE — Therapy (Signed)
[facility] [address] [place], [place], [address] Phone: [phone]   Fax:  [phone]  Physical Therapy Treatment  Patient Details  Name: Corey Larson. MRN: [id number] Date of Birth: February 16, 1928 Referring Provider: Dr. [name]  Encounter Date: 03/19/2017      PT End of Session - 03/19/17 1129    Visit Number 5   Number of Visits 10   Date for PT Re-Evaluation 15-May-2017   Authorization Type G codes;  KX now because of previous PT   PT Start Time 1100   PT Stop Time 1140   PT Time Calculation (min) 40 min   Activity Tolerance Patient tolerated treatment well      Past Medical History:  Diagnosis Date  . Abnormality of gait 11/05/2013  . CAD (coronary artery disease) 2005   stent to LAD by Dr. Glade Lloyd. mild irregulatities of RCA.    [name] CRF (chronic renal failure)   . ED (erectile dysfunction)   . H/O exercise stress test 05/2012   no statistically significant ischemia.   [name] History of TIA (transient ischemic attack)    transient confusion  . HOH (hard of hearing)    hearing aids  . Hyperlipidemia LDL goal < 70   . Hypertension   . Insomnia   . Macular degeneration    bilateral  . Macular degeneration    Right>left  . NSTEMI (non-ST elevated myocardial infarction) ([place]) 04/16/2013  . OSA (obstructive sleep apnea)    mild, never on cpap  . Peripheral neuropathy   . Vitamin D deficiency     Past Surgical History:  Procedure Laterality Date  . ANKLE SURGERY     right ankle 2002 fracture  . BACK SURGERY  1976   laminectomy  . CARDIAC CATHETERIZATION     2005, stent placed DES LAD  . CATARACT EXTRACTION     bilateral  . CORONARY ANGIOPLASTY    . HERNIA REPAIR  1981   "double hernia"  . KNEE SURGERY     right knee 2011   . LEFT HEART CATHETERIZATION WITH CORONARY ANGIOGRAM N/A 04/16/2013   Procedure: LEFT HEART CATHETERIZATION WITH CORONARY ANGIOGRAM;  Surgeon: Leonie Man, MD;   Location: [facility] CATH LAB;  Service: Cardiovascular;  Laterality: N/A;  . NM MYOCAR MULTIPLE W/SPECT  05/13/2012   MILD UPPER SEPTAL THINNING AND DIAPHRAGMATIC ATTENUATION W/O SIGNIFICANT ISCHEMIA. EF 66%.  . TRANSTHORACIC ECHOCARDIOGRAM  05/12/2010   EF =>55%. MILD CONCENTRIC LVH. TRACE MITRAL REGURG.    There were no vitals filed for this visit.      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  No pain today.  States he did a lot of stretching last time and then he drove 240 miles.  My balance is still pretty wobbly especially with one foot activities.  No assistive device used today.     Patient Stated Goals balance get better;  wedding at the end of the month;  hopes to go to [place] in Jan/Feb;  play golf   Currently in Pain? No/denies                         [facility] Adult PT Treatment/Exercise - 03/19/17 0001      Therapeutic Activites    Other Therapeutic Activities sit to stand, standing, upright posture     Neuro Re-ed    Neuro  Re-ed Details  dynamic and static standing balance     Lumbar Exercises: Seated   Sit to Stand Limitations thoracic extension with ball 10x   Other Seated Lumbar Exercises trunk rotation stretch and overhead reach 2x     Knee/Hip Exercises: Stretches   Active Hamstring Stretch Right;Left;5 reps   Active Hamstring Stretch Limitations seated with foot stool   Other Knee/Hip Stretches doorway calf stretch 5x  patient unable to follow directions for psoas stretch     Knee/Hip Exercises: Aerobic   Nustep L1 x 10 min  Monitored and discussion of progression     Knee/Hip Exercises: Standing   Heel Raises Both;20 reps  UE asst    Stairs step taps single arm support 10x   Gait Training beach ball toss with mild balance challenges  narrow base of support and staggered foot positions   Other Standing Knee Exercises wall climbs 10x   Other Standing Knee Exercises step and  reach 5x forward and 5x lateral     Knee/Hip Exercises: Seated   Clamshell with TheraBand Green  with foam to cue posture   Other Seated Knee/Hip Exercises foam roll push down 10x   Sit to Sand 5 reps;without UE support                  PT Short Term Goals - 03/11/17 1126      PT SHORT TERM GOAL #1   Title Pt will be independent in initial HEP   Time 4   Period Weeks   Status Partially Met           PT Long Term Goals - 03/07/17 1548      PT LONG TERM GOAL #1   Title Pt will demo improved Berg to >/= 43/56 for decreased falls risk   Time 8   Period Weeks   Status On-going     PT LONG TERM GOAL #2   Title The patient will be independent in safe self progression of HEP for futher improvements in balance   Time 8   Period Weeks   Status On-going     PT LONG TERM GOAL #3   Title Pt will demo improved bil LE strength grossly 4/5 to assist with more stability with gait   Time 8   Period Weeks   Status On-going     PT LONG TERM GOAL #4   Title Pt will demo improved stability with ambulation with RW or least restrictive assistive device level/unlevel surfaces to assist with out of home outings   Time 8   Period Weeks   Status On-going     PT LONG TERM GOAL #5   Title Timed up and Go to 14 sec indicated improved gait velocity and safety for community ambulation   Time 8   Period Weeks   Status On-going               Plan - 03/19/17 1130    Clinical Impression Statement The patient has some difficulty following 2 step and more complex directions.  Heavier breathing and sighing with both sitting and standing exercises.  Needs seated rest breaks between 3 minutes of standing exercise.  Encouraged use of cane or RW at home and in the community but patient states he doesn't want to use these devices.  On track to meet STGS by 10/30.     Rehab Potential Good   Clinical Impairments Affecting Rehab Potential  peripheral neuropathy   PT Frequency 2x /  week    PT Duration 8 weeks   PT Treatment/Interventions ADLs/Self Care Home Management;Functional mobility training;Stair training;Gait training;DME Instruction;Therapeutic activities;Therapeutic exercise;Balance training;Neuromuscular re-education;Patient/family education;Manual techniques   PT Next Visit Plan standing balance ex;  LE strengthening; stretch HS and hip flexors;  work on upright posture;  KX now secondary to previous PT this year      Patient will benefit from skilled therapeutic intervention in order to improve the following deficits and impairments:  Abnormal gait, Decreased balance, Decreased safety awareness, Decreased strength, Difficulty walking, Impaired flexibility, Postural dysfunction, Pain  Visit Diagnosis: Weakness generalized  Unsteadiness on feet  Difficulty in walking, not elsewhere classified  Chronic bilateral low back pain without sciatica     Problem List Patient Active Problem List   Diagnosis Date Noted  . Neck pain   . Acute deep vein thrombosis (DVT) of proximal vein of lower extremity (HCC)   . Bilateral pulmonary embolism ([place]) 01/08/2017  . Abnormality of gait 11/05/2013  . OSA (obstructive sleep apnea) 07/23/2013  . Erectile dysfunction 07/23/2013  . CAD (coronary artery disease), Hx of LAD DES in 2005 04/15/2013  . Peripheral neuropathy 04/15/2013  . Hyperlipidemia with target LDL less than 70 04/15/2013    Ruben Im C 03/19/2017, 11:46 AM  [facility] [address], [place], [place], [address] Phone: [phone]   Fax:  [phone]  Name: Corey Larson. MRN: [id number] Date of Birth: 1927/11/23

## 2017-03-21 ENCOUNTER — Ambulatory Visit: Payer: Medicare Other | Admitting: Physical Therapy

## 2017-03-21 DIAGNOSIS — R262 Difficulty in walking, not elsewhere classified: Secondary | ICD-10-CM | POA: Diagnosis not present

## 2017-03-21 DIAGNOSIS — R531 Weakness: Secondary | ICD-10-CM

## 2017-03-21 DIAGNOSIS — R2681 Unsteadiness on feet: Secondary | ICD-10-CM

## 2017-03-21 DIAGNOSIS — M545 Low back pain: Secondary | ICD-10-CM | POA: Diagnosis not present

## 2017-03-21 DIAGNOSIS — G8929 Other chronic pain: Secondary | ICD-10-CM | POA: Diagnosis not present

## 2017-03-21 NOTE — Therapy (Signed)
[facility] [address], [place], [place], [address] Phone: [phone]   Fax:  [phone]  Physical Therapy Treatment  Patient Details  Name: Corey Larson. MRN: [id number] Date of Birth: 28-May-1928 Referring Provider: Dr. [name]  Encounter Date: 03/21/2017      PT End of Session - 03/21/17 1055    Visit Number 6   Number of Visits 10   Date for PT Re-Evaluation 05-19-17   Authorization Type G codes;  KX now because of previous PT   PT Start Time 1020   PT Stop Time 1058   PT Time Calculation (min) 38 min   Activity Tolerance Patient tolerated treatment well      Past Medical History:  Diagnosis Date  . Abnormality of gait 11/05/2013  . CAD (coronary artery disease) 2005   stent to LAD by Dr. Glade Lloyd. mild irregulatities of RCA.    [name] CRF (chronic renal failure)   . ED (erectile dysfunction)   . H/O exercise stress test 05/2012   no statistically significant ischemia.   [name] History of TIA (transient ischemic attack)    transient confusion  . HOH (hard of hearing)    hearing aids  . Hyperlipidemia LDL goal < 70   . Hypertension   . Insomnia   . Macular degeneration    bilateral  . Macular degeneration    Right>left  . NSTEMI (non-ST elevated myocardial infarction) ([place]) 04/16/2013  . OSA (obstructive sleep apnea)    mild, never on cpap  . Peripheral neuropathy   . Vitamin D deficiency     Past Surgical History:  Procedure Laterality Date  . ANKLE SURGERY     right ankle 2002 fracture  . BACK SURGERY  1976   laminectomy  . CARDIAC CATHETERIZATION     2005, stent placed DES LAD  . CATARACT EXTRACTION     bilateral  . CORONARY ANGIOPLASTY    . HERNIA REPAIR  1981   "double hernia"  . KNEE SURGERY     right knee 2011   . LEFT HEART CATHETERIZATION WITH CORONARY ANGIOGRAM N/A 04/16/2013   Procedure: LEFT HEART CATHETERIZATION WITH CORONARY ANGIOGRAM;  Surgeon: Leonie Man, MD;   Location: [facility] CATH LAB;  Service: Cardiovascular;  Laterality: N/A;  . NM MYOCAR MULTIPLE W/SPECT  05/13/2012   MILD UPPER SEPTAL THINNING AND DIAPHRAGMATIC ATTENUATION W/O SIGNIFICANT ISCHEMIA. EF 66%.  . TRANSTHORACIC ECHOCARDIOGRAM  05/12/2010   EF =>55%. MILD CONCENTRIC LVH. TRACE MITRAL REGURG.    There were no vitals filed for this visit.      Subjective Assessment - 03/21/17 1024    Subjective "She stretched me good last time."  Denies soreness after last visit.  Went to play bridge last night and didn't go to sleep until 1 AM.  Having a root canal tomorrow.  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     Currently in Pain? No/denies                         [facility] Adult PT Treatment/Exercise - 03/21/17 0001      Therapeutic Activites    Other Therapeutic Activities sit to stand, standing, upright posture     Neuro Re-ed    Neuro Re-ed Details  dynamic and static standing balance     Lumbar Exercises: Seated   Sit to Stand Limitations thoracic extension with ball 10x   Other Seated Lumbar Exercises 2#  red plyo ball chops 10x each side      Knee/Hip Exercises: Stretches   Active Hamstring Stretch Right;Left;5 reps   Active Hamstring Stretch Limitations seated with foot stool     Knee/Hip Exercises: Aerobic   Nustep L1 x 10 min  Monitored and discussion of progression     Knee/Hip Exercises: Standing   Heel Raises Both;20 reps  UE asst    Forward Lunges Limitations on 2nd step 5x bil   Stairs step taps single arm support 10x   Other Standing Knee Exercises --   Other Standing Knee Exercises stepping over block on floor 5x bil      Knee/Hip Exercises: Seated   Long Arc Quad Strengthening;Right;Left;10 reps   Long Arc Quad Limitations red band   Heel Slides Limitations thoracic extension with ball 15x   Knee/Hip Flexion red band 10x bil    Other Seated Knee/Hip Exercises foam roll push down 10x   Sit to Sand 5 reps;without UE  support     Bil LE strength grossly 4/5             PT Short Term Goals - 03/11/17 1126      PT SHORT TERM GOAL #1   Title Pt will be independent in initial HEP   Time 4   Period Weeks   Status Partially Met           PT Long Term Goals - 03/07/17 1548      PT LONG TERM GOAL #1   Title Pt will demo improved Berg to >/= 43/56 for decreased falls risk   Time 8   Period Weeks   Status On-going     PT LONG TERM GOAL #2   Title The patient will be independent in safe self progression of HEP for futher improvements in balance   Time 8   Period Weeks   Status On-going     PT LONG TERM GOAL #3   Title Pt will demo improved bil LE strength grossly 4/5 to assist with more stability with gait   Time 8   Period Weeks   Status On-going     PT LONG TERM GOAL #4   Title Pt will demo improved stability with ambulation with RW or least restrictive assistive device level/unlevel surfaces to assist with out of home outings   Time 8   Period Weeks   Status On-going     PT LONG TERM GOAL #5   Title Timed up and Go to 14 sec indicated improved gait velocity and safety for community ambulation   Time 8   Period Weeks   Status On-going               Plan - 03/21/17 1055    Clinical Impression Statement The patient reports low back pain with standing and is limited to 2-3 exercises in standing before he needs to sit, relieving his back pain.  He reports moderate general fatigue with heavier breathing during exercises.  Close supervision for safety.  Short shuffled steps at times.      Rehab Potential Good   Clinical Impairments Affecting Rehab Potential  peripheral neuropathy   PT Frequency 2x / week   PT Duration 8 weeks   PT Treatment/Interventions ADLs/Self Care Home Management;Functional mobility training;Stair training;Gait training;DME Instruction;Therapeutic activities;Therapeutic exercise;Balance training;Neuromuscular re-education;Patient/family  education;Manual techniques   PT Next Visit Plan standing balance ex;  LE strengthening; stretch HS and hip flexors;  work on upright posture;  KX now  secondary to previous PT this year;  encourage use of cane and/or walker especially at the upcoming family wedding      Patient will benefit from skilled therapeutic intervention in order to improve the following deficits and impairments:  Abnormal gait, Decreased balance, Decreased safety awareness, Decreased strength, Difficulty walking, Impaired flexibility, Postural dysfunction, Pain  Visit Diagnosis: Weakness generalized  Unsteadiness on feet  Difficulty in walking, not elsewhere classified     Problem List Patient Active Problem List   Diagnosis Date Noted  . Neck pain   . Acute deep vein thrombosis (DVT) of proximal vein of lower extremity (HCC)   . Bilateral pulmonary embolism ([place]) 01/08/2017  . Abnormality of gait 11/05/2013  . OSA (obstructive sleep apnea) 07/23/2013  . Erectile dysfunction 07/23/2013  . CAD (coronary artery disease), Hx of LAD DES in 2005 04/15/2013  . Peripheral neuropathy 04/15/2013  . Hyperlipidemia with target LDL less than 70 04/15/2013   Ruben Im, PT 03/21/17 11:50 AM Phone: [phone] Fax: [phone]  Alvera Singh 03/21/2017, 11:50 AM  [facility] [address] [place], [place], [address] Phone: [phone]   Fax:  [phone]  Name: Corey Larson. MRN: [id number] Date of Birth: [birth year]

## 2017-03-25 ENCOUNTER — Ambulatory Visit: Payer: Medicare Other | Admitting: Physical Therapy

## 2017-03-25 ENCOUNTER — Encounter: Payer: Self-pay | Admitting: Physical Therapy

## 2017-03-25 DIAGNOSIS — G8929 Other chronic pain: Secondary | ICD-10-CM

## 2017-03-25 DIAGNOSIS — R262 Difficulty in walking, not elsewhere classified: Secondary | ICD-10-CM

## 2017-03-25 DIAGNOSIS — R531 Weakness: Secondary | ICD-10-CM | POA: Diagnosis not present

## 2017-03-25 DIAGNOSIS — M545 Low back pain: Secondary | ICD-10-CM

## 2017-03-25 DIAGNOSIS — R2681 Unsteadiness on feet: Secondary | ICD-10-CM | POA: Diagnosis not present

## 2017-03-25 NOTE — Therapy (Signed)
[facility] [address], [place], [place], [address] Phone: [phone]   Fax:  [phone]  Physical Therapy Treatment  Patient Details  Name: Corey Larson. MRN: [id number] Date of Birth: August 22, 1927 Referring Provider: Dr. [name]  Encounter Date: 03/25/2017      PT End of Session - 03/25/17 1018    Visit Number 7   Number of Visits 10   Date for PT Re-Evaluation 2017-05-27   Authorization Type G codes;  KX now because of previous PT   PT Start Time 1015   PT Stop Time 1053   PT Time Calculation (min) 38 min   Activity Tolerance Patient tolerated treatment well   Behavior During Therapy [facility] for tasks assessed/performed      Past Medical History:  Diagnosis Date  . Abnormality of gait 11/05/2013  . CAD (coronary artery disease) 2005   stent to LAD by Dr. Glade Lloyd. mild irregulatities of RCA.    [name] CRF (chronic renal failure)   . ED (erectile dysfunction)   . H/O exercise stress test 05/2012   no statistically significant ischemia.   [name] History of TIA (transient ischemic attack)    transient confusion  . HOH (hard of hearing)    hearing aids  . Hyperlipidemia LDL goal < 70   . Hypertension   . Insomnia   . Macular degeneration    bilateral  . Macular degeneration    Right>left  . NSTEMI (non-ST elevated myocardial infarction) ([place]) 04/16/2013  . OSA (obstructive sleep apnea)    mild, never on cpap  . Peripheral neuropathy   . Vitamin D deficiency     Past Surgical History:  Procedure Laterality Date  . ANKLE SURGERY     right ankle 2002 fracture  . BACK SURGERY  1976   laminectomy  . CARDIAC CATHETERIZATION     2005, stent placed DES LAD  . CATARACT EXTRACTION     bilateral  . CORONARY ANGIOPLASTY    . HERNIA REPAIR  1981   "double hernia"  . KNEE SURGERY     right knee 2011   . LEFT HEART CATHETERIZATION WITH CORONARY ANGIOGRAM N/A 04/16/2013   Procedure: LEFT HEART  CATHETERIZATION WITH CORONARY ANGIOGRAM;  Surgeon: Leonie Man, MD;  Location: [facility] CATH LAB;  Service: Cardiovascular;  Laterality: N/A;  . NM MYOCAR MULTIPLE W/SPECT  05/13/2012   MILD UPPER SEPTAL THINNING AND DIAPHRAGMATIC ATTENUATION W/O SIGNIFICANT ISCHEMIA. EF 66%.  . TRANSTHORACIC ECHOCARDIOGRAM  05/12/2010   EF =>55%. MILD CONCENTRIC LVH. TRACE MITRAL REGURG.    There were no vitals filed for this visit.      Subjective Assessment - 03/25/17 1019    Subjective I just got up! I almost overslept. No pain complaints this AM.   Currently in Pain? No/denies   Multiple Pain Sites No                         [facility] Adult PT Treatment/Exercise - 03/25/17 0001      Neuro Re-ed    Neuro Re-ed Details  dynamic and static standing balance     Lumbar Exercises: Seated   Sit to Stand Limitations thoracic extension with red band 2x10 with noodle behind back     Knee/Hip Exercises: Stretches   Active Hamstring Stretch Right;Left;5 reps   Gastroc Stretch Both;2 reps;20 seconds     Knee/Hip Exercises: Aerobic   Nustep L1 x 10 min  Monitored  and discussion of progression     Knee/Hip Exercises: Seated   Long Arc Quad Strengthening;Right;Left;10 reps   Clamshell with Marga Hoots  with foam to cue posture   Sit to Sand 5 reps;without UE support     Shoulder Exercises: Pulleys   Flexion 3 minutes                  PT Short Term Goals - 03/11/17 1126      PT SHORT TERM GOAL #1   Title Pt will be independent in initial HEP   Time 4   Period Weeks   Status Partially Met           PT Long Term Goals - 03/07/17 1548      PT LONG TERM GOAL #1   Title Pt will demo improved Berg to >/= 43/56 for decreased falls risk   Time 8   Period Weeks   Status On-going     PT LONG TERM GOAL #2   Title The patient will be independent in safe self progression of HEP for futher improvements in balance   Time 8   Period Weeks   Status On-going     PT  LONG TERM GOAL #3   Title Pt will demo improved bil LE strength grossly 4/5 to assist with more stability with gait   Time 8   Period Weeks   Status On-going     PT LONG TERM GOAL #4   Title Pt will demo improved stability with ambulation with RW or least restrictive assistive device level/unlevel surfaces to assist with out of home outings   Time 8   Period Weeks   Status On-going     PT LONG TERM GOAL #5   Title Timed up and Go to 14 sec indicated improved gait velocity and safety for community ambulation   Time 8   Period Weeks   Status On-going               Plan - 03/25/17 1019    Clinical Impression Statement Frequent verbal and tactile cues for posture and for increasing his stride length. No pain today. Encouraged no UE support during standing balance ex since pt chooses to not use an asst device at this time.    Rehab Potential Good   Clinical Impairments Affecting Rehab Potential  peripheral neuropathy   PT Frequency 2x / week   PT Duration 8 weeks   PT Treatment/Interventions ADLs/Self Care Home Management;Functional mobility training;Stair training;Gait training;DME Instruction;Therapeutic activities;Therapeutic exercise;Balance training;Neuromuscular re-education;Patient/family education;Manual techniques   PT Next Visit Plan standing balance ex;  LE strengthening; stretch HS and hip flexors;  work on upright posture;  KX now secondary to previous PT this year;  encourage use of cane and/or walker especially at the upcoming family wedding   Consulted and Agree with Plan of Care Patient      Patient will benefit from skilled therapeutic intervention in order to improve the following deficits and impairments:  Abnormal gait, Decreased balance, Decreased safety awareness, Decreased strength, Difficulty walking, Impaired flexibility, Postural dysfunction, Pain  Visit Diagnosis: Weakness generalized  Unsteadiness on feet  Difficulty in walking, not elsewhere  classified  Chronic bilateral low back pain without sciatica     Problem List Patient Active Problem List   Diagnosis Date Noted  . Neck pain   . Acute deep vein thrombosis (DVT) of proximal vein of lower extremity (HCC)   . Bilateral pulmonary embolism ([place]) 01/08/2017  . Abnormality  of gait 11/05/2013  . OSA (obstructive sleep apnea) 07/23/2013  . Erectile dysfunction 07/23/2013  . CAD (coronary artery disease), Hx of LAD DES in 2005 04/15/2013  . Peripheral neuropathy 04/15/2013  . Hyperlipidemia with target LDL less than 70 04/15/2013    Corey Larson, PTA 03/25/2017, 10:49 AM  [facility] [address] [place], [place], [address] Phone: [phone]   Fax:  [phone]  Name: Corey Larson. MRN: [id number] Date of Birth: 10/15/1927

## 2017-03-27 ENCOUNTER — Ambulatory Visit: Payer: Medicare Other | Admitting: Physical Therapy

## 2017-04-02 ENCOUNTER — Ambulatory Visit: Payer: Medicare Other | Admitting: Physical Therapy

## 2017-04-02 DIAGNOSIS — R262 Difficulty in walking, not elsewhere classified: Secondary | ICD-10-CM | POA: Diagnosis not present

## 2017-04-02 DIAGNOSIS — R2681 Unsteadiness on feet: Secondary | ICD-10-CM

## 2017-04-02 DIAGNOSIS — R531 Weakness: Secondary | ICD-10-CM

## 2017-04-02 DIAGNOSIS — M545 Low back pain: Secondary | ICD-10-CM | POA: Diagnosis not present

## 2017-04-02 DIAGNOSIS — G8929 Other chronic pain: Secondary | ICD-10-CM | POA: Diagnosis not present

## 2017-04-02 NOTE — Therapy (Addendum)
[facility] [address] [place], [place], [address] Phone: [phone]   Fax:  [phone]  Physical Therapy Treatment  Patient Details  Name: Corey Larson. MRN: [id number] Date of Birth: May 26, 1928 Referring Provider: Dr. [name]  Encounter Date: 04/02/2017      PT End of Session - 04/02/17 1048    Visit Number 8   Number of Visits 10   Date for PT Re-Evaluation 2017/05/11   Authorization Type G codes;  KX now because of previous PT   PT Start Time 1018   PT Stop Time 1100   PT Time Calculation (min) 42 min   Activity Tolerance Patient tolerated treatment well      Past Medical History:  Diagnosis Date  . Abnormality of gait 11/05/2013  . CAD (coronary artery disease) 2005   stent to LAD by Dr. Glade Lloyd. mild irregulatities of RCA.    [name] CRF (chronic renal failure)   . ED (erectile dysfunction)   . H/O exercise stress test 05/2012   no statistically significant ischemia.   [name] History of TIA (transient ischemic attack)    transient confusion  . HOH (hard of hearing)    hearing aids  . Hyperlipidemia LDL goal < 70   . Hypertension   . Insomnia   . Macular degeneration    bilateral  . Macular degeneration    Right>left  . NSTEMI (non-ST elevated myocardial infarction) ([place]) 04/16/2013  . OSA (obstructive sleep apnea)    mild, never on cpap  . Peripheral neuropathy   . Vitamin D deficiency     Past Surgical History:  Procedure Laterality Date  . ANKLE SURGERY     right ankle 2002 fracture  . BACK SURGERY  1976   laminectomy  . CARDIAC CATHETERIZATION     2005, stent placed DES LAD  . CATARACT EXTRACTION     bilateral  . CORONARY ANGIOPLASTY    . HERNIA REPAIR  1981   "double hernia"  . KNEE SURGERY     right knee 2011   . LEFT HEART CATHETERIZATION WITH CORONARY ANGIOGRAM N/A 04/16/2013   Procedure: LEFT HEART CATHETERIZATION WITH CORONARY ANGIOGRAM;  Surgeon: Leonie Man, MD;   Location: [facility] CATH LAB;  Service: Cardiovascular;  Laterality: N/A;  . NM MYOCAR MULTIPLE W/SPECT  05/13/2012   MILD UPPER SEPTAL THINNING AND DIAPHRAGMATIC ATTENUATION W/O SIGNIFICANT ISCHEMIA. EF 66%.  . TRANSTHORACIC ECHOCARDIOGRAM  05/12/2010   EF =>55%. MILD CONCENTRIC LVH. TRACE MITRAL REGURG.    There were no vitals filed for this visit.      Subjective Assessment - 04/02/17 1024    Subjective The patient reports he fell at home last night when he got up from the chair too quickly.  He reports he has some cuts on his arms but is otherwise uninjured.  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  He states he [place] not take his cane [place] the [place] and "I got along fine."     Currently in Pain? Yes ;  Unable to rate sore all over from fall yesterday                         [place] Adult PT Treatment/Exercise - 04/02/17 0001      Therapeutic Activites    Other Therapeutic Activities sit to stand, standing, upright posture  Neuro Re-ed    Neuro Re-ed Details  dynamic and static standing balance     Knee/Hip Exercises: Stretches   Active Hamstring Stretch Right;Left;5 reps     Knee/Hip Exercises: Aerobic   Nustep L1 x 10 min  Monitored and discussion of progression     Knee/Hip Exercises: Standing   Heel Raises Both;20 reps  UE asst    Stairs step taps single arm support 10x and no UE support 5x   Other Standing Knee Exercises weight shifting 4 ways   Other Standing Knee Exercises step and reach forward 5x right/left     Knee/Hip Exercises: Seated   Long Arc Quad Strengthening;Right;Left;10 reps   Long Arc Quad Limitations red band   Clamshell with TheraBand Green   Knee/Hip Flexion red band 10x bil    Other Seated Knee/Hip Exercises foam roll push down 10x   Sit to Sand 5 reps;without UE support                  PT Short Term Goals - 04/02/17 1103      PT SHORT TERM GOAL #1    Title Pt will be independent in initial HEP   Status Partially Met     PT SHORT TERM GOAL #2   Title Improved bil HS length to 65 degrees and hip flexor lengths to 5 degrees needed for improved step length   Time 4   Period Weeks   Status On-going     PT SHORT TERM GOAL #3   Title Pt will demo improved Berg to 40/56 for decreased falls risk   Time 4   Period Weeks   Status On-going           PT Long Term Goals - 03/07/17 1548      PT LONG TERM GOAL #1   Title Pt will demo improved Berg to >/= 43/56 for decreased falls risk   Time 8   Period Weeks   Status On-going     PT LONG TERM GOAL #2   Title The patient will be independent in safe self progression of HEP for futher improvements in balance   Time 8   Period Weeks   Status On-going     PT LONG TERM GOAL #3   Title Pt will demo improved bil LE strength grossly 4/5 to assist with more stability with gait   Time 8   Period Weeks   Status On-going     PT LONG TERM GOAL #4   Title Pt will demo improved stability with ambulation with RW or least restrictive assistive device level/unlevel surfaces to assist with out of home outings   Time 8   Period Weeks   Status On-going     PT LONG TERM GOAL #5   Title Timed up and Go to 14 sec indicated improved gait velocity and safety for community ambulation   Time 8   Period Weeks   Status On-going               Plan - 04/02/17 1048    Clinical Impression Statement Decreased exercise intensity today secondary to soreness from a fall yesterday.  Discussed recommendation to pause upon rising before taking that first step.   Also encourgaged use of assistive device.   Cues needed for upright posture.   Uses hip strategy for balance rather than ankle strategy.     Rehab Potential Good   Clinical Impairments Affecting Rehab Potential  peripheral neuropathy   PT  Frequency 2x / week   PT Duration 8 weeks   PT Treatment/Interventions ADLs/Self Care Home  Management;Functional mobility training;Stair training;Gait training;DME Instruction;Therapeutic activities;Therapeutic exercise;Balance training;Neuromuscular re-education;Patient/family education;Manual techniques   PT Next Visit Plan recheck BERG and progress with STGs;  check HS and hip flexor length for STG;  Will need G code in 2 visits;  standing balance ex;  LE strengthening; stretch HS and hip flexors;  work on upright posture;  KX now secondary to previous PT this year;  encourage use of cane and/or walker secondary to risk of falls      Patient will benefit from skilled therapeutic intervention in order to improve the following deficits and impairments:  Abnormal gait, Decreased balance, Decreased safety awareness, Decreased strength, Difficulty walking, Impaired flexibility, Postural dysfunction, Pain  Visit Diagnosis: Weakness generalized  Unsteadiness on feet  Difficulty in walking, not elsewhere classified  Chronic bilateral low back pain without sciatica     Problem List Patient Active Problem List   Diagnosis Date Noted  . Neck pain   . Acute deep vein thrombosis (DVT) of proximal vein of lower extremity (HCC)   . Bilateral pulmonary embolism ([place]) 01/08/2017  . Abnormality of gait 11/05/2013  . OSA (obstructive sleep apnea) 07/23/2013  . Erectile dysfunction 07/23/2013  . CAD (coronary artery disease), Hx of LAD DES in 2005 04/15/2013  . Peripheral neuropathy 04/15/2013  . Hyperlipidemia with target LDL less than 70 04/15/2013   Ruben Im, PT 04/02/17 11:07 AM Phone: [phone] Fax: [phone]  Alvera Singh 04/02/2017, 11:06 AM  [facility] [address] [place], [place], [address] Phone: [phone]   Fax:  [phone]  Name: Jalan Fariss. MRN: [id number] Date of Birth: 06/03/1928

## 2017-04-05 ENCOUNTER — Ambulatory Visit: Payer: Medicare Other | Attending: Internal Medicine | Admitting: Physical Therapy

## 2017-04-05 DIAGNOSIS — R531 Weakness: Secondary | ICD-10-CM

## 2017-04-05 DIAGNOSIS — M545 Low back pain, unspecified: Secondary | ICD-10-CM

## 2017-04-05 DIAGNOSIS — R2681 Unsteadiness on feet: Secondary | ICD-10-CM | POA: Diagnosis not present

## 2017-04-05 DIAGNOSIS — G8929 Other chronic pain: Secondary | ICD-10-CM | POA: Diagnosis not present

## 2017-04-05 DIAGNOSIS — R262 Difficulty in walking, not elsewhere classified: Secondary | ICD-10-CM | POA: Diagnosis not present

## 2017-04-05 NOTE — Therapy (Signed)
[facility] [address], [place], [place], [address] Phone: [phone]   Fax:  [phone]  Physical Therapy Treatment  Patient Details  Name: Corey Larson. MRN: [id number] Date of Birth: 03-27-1928 Referring Provider: Dr. [name]  Encounter Date: 04/05/2017      PT End of Session - 04/05/17 0932    Visit Number 9   Number of Visits 10   Date for PT Re-Evaluation May 03, 2017   Authorization Type G codes;  KX now because of previous PT   PT Start Time 0930   PT Stop Time 1010   PT Time Calculation (min) 40 min   Activity Tolerance Patient tolerated treatment well   Behavior During Therapy [facility] for tasks assessed/performed      Past Medical History:  Diagnosis Date  . Abnormality of gait 11/05/2013  . CAD (coronary artery disease) 2005   stent to LAD by Dr. Glade Lloyd. mild irregulatities of RCA.    [name] CRF (chronic renal failure)   . ED (erectile dysfunction)   . H/O exercise stress test 05/2012   no statistically significant ischemia.   [name] History of TIA (transient ischemic attack)    transient confusion  . HOH (hard of hearing)    hearing aids  . Hyperlipidemia LDL goal < 70   . Hypertension   . Insomnia   . Macular degeneration    bilateral  . Macular degeneration    Right>left  . NSTEMI (non-ST elevated myocardial infarction) ([place]) 04/16/2013  . OSA (obstructive sleep apnea)    mild, never on cpap  . Peripheral neuropathy   . Vitamin D deficiency     Past Surgical History:  Procedure Laterality Date  . ANKLE SURGERY     right ankle 2002 fracture  . BACK SURGERY  1976   laminectomy  . CARDIAC CATHETERIZATION     2005, stent placed DES LAD  . CATARACT EXTRACTION     bilateral  . CORONARY ANGIOPLASTY    . HERNIA REPAIR  1981   "double hernia"  . KNEE SURGERY     right knee 2011   . LEFT HEART CATHETERIZATION WITH CORONARY ANGIOGRAM N/A 04/16/2013   Procedure: LEFT HEART CATHETERIZATION  WITH CORONARY ANGIOGRAM;  Surgeon: Leonie Man, MD;  Location: [facility] CATH LAB;  Service: Cardiovascular;  Laterality: N/A;  . NM MYOCAR MULTIPLE W/SPECT  05/13/2012   MILD UPPER SEPTAL THINNING AND DIAPHRAGMATIC ATTENUATION W/O SIGNIFICANT ISCHEMIA. EF 66%.  . TRANSTHORACIC ECHOCARDIOGRAM  05/12/2010   EF =>55%. MILD CONCENTRIC LVH. TRACE MITRAL REGURG.    There were no vitals filed for this visit.                       [place] Adult PT Treatment/Exercise - 04/05/17 0001      Knee/Hip Exercises: Aerobic   Nustep L1 x 10 min  Monitored and discussion of progression     Knee/Hip Exercises: Standing   Heel Raises Both;20 reps  UE asst    Rebounder Balance ex: weight shiifting, marching, heel raises,   Light UE support     Knee/Hip Exercises: Seated   Long Arc Quad Strengthening;Both;20 reps   Long Arc Quad Weight 2 lbs.   Ball Squeeze Yellow band horizontal abd 2x10 with ball squeeze isometric  noodle behind the back thoracic   Sit to Sand --  2x 5 no UE  PT Short Term Goals - 04/02/17 1103      PT SHORT TERM GOAL #1   Title Pt will be independent in initial HEP   Status Partially Met     PT SHORT TERM GOAL #2   Title Improved bil HS length to 65 degrees and hip flexor lengths to 5 degrees needed for improved step length   Time 4   Period Weeks   Status On-going     PT SHORT TERM GOAL #3   Title Pt will demo improved Berg to 40/56 for decreased falls risk   Time 4   Period Weeks   Status On-going           PT Long Term Goals - 03/07/17 1548      PT LONG TERM GOAL #1   Title Pt will demo improved Berg to >/= 43/56 for decreased falls risk   Time 8   Period Weeks   Status On-going     PT LONG TERM GOAL #2   Title The patient will be independent in safe self progression of HEP for futher improvements in balance   Time 8   Period Weeks   Status On-going     PT LONG TERM GOAL #3   Title Pt will demo improved bil LE  strength grossly 4/5 to assist with more stability with gait   Time 8   Period Weeks   Status On-going     PT LONG TERM GOAL #4   Title Pt will demo improved stability with ambulation with RW or least restrictive assistive device level/unlevel surfaces to assist with out of home outings   Time 8   Period Weeks   Status On-going     PT LONG TERM GOAL #5   Title Timed up and Go to 14 sec indicated improved gait velocity and safety for community ambulation   Time 8   Period Weeks   Status On-going               Plan - 04/05/17 0932    Clinical Impression Statement Pt feeling better after fall and reported massage. Pt presented with very short, small steps and flexed posture today.  Continued to practice more upright posture , taking longer steps, and taking that pause upon standing up before walking. He reports he forgets to do this. Requires light UE support on th emini tramp when performing balance exs. Verbal cues to use ankles during weight shifting.    Rehab Potential Good   Clinical Impairments Affecting Rehab Potential  peripheral neuropathy   PT Frequency 2x / week   PT Duration 8 weeks   PT Treatment/Interventions ADLs/Self Care Home Management;Functional mobility training;Stair training;Gait training;DME Instruction;Therapeutic activities;Therapeutic exercise;Balance training;Neuromuscular re-education;Patient/family education;Manual techniques   PT Next Visit Plan Reassessment visit next: G codes, BERG, check goals.   Consulted and Agree with Plan of Care Patient      Patient will benefit from skilled therapeutic intervention in order to improve the following deficits and impairments:  Abnormal gait, Decreased balance, Decreased safety awareness, Decreased strength, Difficulty walking, Impaired flexibility, Postural dysfunction, Pain  Visit Diagnosis: Weakness generalized  Unsteadiness on feet  Difficulty in walking, not elsewhere classified  Chronic bilateral  low back pain without sciatica     Problem List Patient Active Problem List   Diagnosis Date Noted  . Neck pain   . Acute deep vein thrombosis (DVT) of proximal vein of lower extremity (HCC)   . Bilateral pulmonary embolism ([place]) 01/08/2017  .  Abnormality of gait 11/05/2013  . OSA (obstructive sleep apnea) 07/23/2013  . Erectile dysfunction 07/23/2013  . CAD (coronary artery disease), Hx of LAD DES in 2005 04/15/2013  . Peripheral neuropathy 04/15/2013  . Hyperlipidemia with target LDL less than 70 04/15/2013    Ashely Goosby, PTA 04/05/2017, 10:46 AM  [facility] [address] [place], [place], [address] Phone: [phone]   Fax:  [phone]  Name: Corey Larson. MRN: [id number] Date of Birth: 1928/05/28

## 2017-04-08 ENCOUNTER — Encounter: Payer: PRIVATE HEALTH INSURANCE | Admitting: Physical Therapy

## 2017-04-08 ENCOUNTER — Ambulatory Visit: Payer: Medicare Other

## 2017-04-08 DIAGNOSIS — R531 Weakness: Secondary | ICD-10-CM

## 2017-04-08 DIAGNOSIS — R262 Difficulty in walking, not elsewhere classified: Secondary | ICD-10-CM

## 2017-04-08 DIAGNOSIS — R2681 Unsteadiness on feet: Secondary | ICD-10-CM

## 2017-04-08 DIAGNOSIS — G8929 Other chronic pain: Secondary | ICD-10-CM | POA: Diagnosis not present

## 2017-04-08 DIAGNOSIS — M545 Low back pain: Secondary | ICD-10-CM | POA: Diagnosis not present

## 2017-04-08 NOTE — Therapy (Signed)
[facility] [address], [place], [place], [address] Phone: [phone]   Fax:  [phone]  Physical Therapy Treatment  Patient Details  Name: Corey Larson. MRN: [id number] Date of Birth: 10/27/1927 Referring Provider: Dr. [name]   Encounter Date: 04/08/2017  PT End of Session - 04/08/17 1443    Visit Number  10    Number of Visits  20    Date for PT Re-Evaluation  04/30/17    Authorization Type  G codes;  KX now because of previous PT    PT Start Time  1400    PT Stop Time  1442    PT Time Calculation (min)  42 min    Activity Tolerance  Patient tolerated treatment well    Behavior During Therapy  [facility] for tasks assessed/performed       Past Medical History:  Diagnosis Date  . Abnormality of gait 11/05/2013  . CAD (coronary artery disease) 2005   stent to LAD by Dr. Glade Lloyd. mild irregulatities of RCA.    [name] CRF (chronic renal failure)   . ED (erectile dysfunction)   . H/O exercise stress test 05/2012   no statistically significant ischemia.   [name] History of TIA (transient ischemic attack)    transient confusion  . HOH (hard of hearing)    hearing aids  . Hyperlipidemia LDL goal < 70   . Hypertension   . Insomnia   . Macular degeneration    bilateral  . Macular degeneration    Right>left  . NSTEMI (non-ST elevated myocardial infarction) ([place]) 04/16/2013  . OSA (obstructive sleep apnea)    mild, never on cpap  . Peripheral neuropathy   . Vitamin D deficiency     Past Surgical History:  Procedure Laterality Date  . ANKLE SURGERY     right ankle 2002 fracture  . BACK SURGERY  1976   laminectomy  . CARDIAC CATHETERIZATION     2005, stent placed DES LAD  . CATARACT EXTRACTION     bilateral  . CORONARY ANGIOPLASTY    . HERNIA REPAIR  1981   "double hernia"  . KNEE SURGERY     right knee 2011   . NM MYOCAR MULTIPLE W/SPECT  05/13/2012   MILD UPPER SEPTAL THINNING AND DIAPHRAGMATIC  ATTENUATION W/O SIGNIFICANT ISCHEMIA. EF 66%.  . TRANSTHORACIC ECHOCARDIOGRAM  05/12/2010   EF =>55%. MILD CONCENTRIC LVH. TRACE MITRAL REGURG.    There were no vitals filed for this visit.  Subjective Assessment - 04/08/17 1415    Subjective  No falls this week.  I have been working on standing up straighter    Currently in Pain?  No/denies         [facility] PT Assessment - 04/08/17 0001      Timed Up and Go Test   TUG  Normal TUG    Normal TUG (seconds)  13    TUG Comments  Normal                  [facility] Adult PT Treatment/Exercise - 04/08/17 0001      Knee/Hip Exercises: Stretches   Active Hamstring Stretch  Right;Left;3 reps;20 seconds      Knee/Hip Exercises: Aerobic   Nustep  L2 x 10 min Monitored and discussion of progression   Monitored and discussion of progression     Knee/Hip Exercises: Standing   Heel Raises  Both;20 reps UE asst    UE asst  Rebounder  Balance ex: weight shiifting, marching, heel raises,  Light UE support   Light UE support     Knee/Hip Exercises: Seated   Long Arc Quad  Strengthening;Both;20 reps    Long Arc Quad Weight  3 lbs.    Ball Squeeze  Yellow band horizontal abd 2x10 with ball squeeze isometric noodle behind the back thoracic   noodle behind the back thoracic   Sit to Sand  2 sets;without UE support;5 reps               PT Short Term Goals - 04/02/17 1103      PT SHORT TERM GOAL #1   Title  Pt will be independent in initial HEP    Status  Partially Met      PT SHORT TERM GOAL #2   Title  Improved bil HS length to 65 degrees and hip flexor lengths to 5 degrees needed for improved step length    Time  4    Period  Weeks    Status  On-going      PT SHORT TERM GOAL #3   Title  Pt will demo improved Berg to 40/56 for decreased falls risk    Time  4    Period  Weeks    Status  On-going        PT Long Term Goals - 03/07/17 1548      PT LONG TERM GOAL #1   Title  Pt will demo improved Berg to >/= 43/56  for decreased falls risk    Time  8    Period  Weeks    Status  On-going      PT LONG TERM GOAL #2   Title  The patient will be independent in safe self progression of HEP for futher improvements in balance    Time  8    Period  Weeks    Status  On-going      PT LONG TERM GOAL #3   Title  Pt will demo improved bil LE strength grossly 4/5 to assist with more stability with gait    Time  8    Period  Weeks    Status  On-going      PT LONG TERM GOAL #4   Title  Pt will demo improved stability with ambulation with RW or least restrictive assistive device level/unlevel surfaces to assist with out of home outings    Time  8    Period  Weeks    Status  On-going      PT LONG TERM GOAL #5   Title  Timed up and Go to 14 sec indicated improved gait velocity and safety for community ambulation    Time  8    Period  Weeks    Status  On-going            Plan - 04/08/17 1405    Clinical Impression Statement  Pt with continued chronic balance and strength deficits.  Pt continues to practice upright posture and taking longer steps.  Pt requires supervision for safety and light UE support with balance exercises today.  Pt with improved time with TUG indicating reduced risk of falls.  Pt will continue to benefit from skilled PT for strength, endurance, gait and balance activities to improve safety at home and in the community.      Rehab Potential  Good    PT Frequency  2x / week    PT Duration  8 weeks  PT Treatment/Interventions  ADLs/Self Care Home Management;Functional mobility training;Stair training;Gait training;DME Instruction;Therapeutic activities;Therapeutic exercise;Balance training;Neuromuscular re-education;Patient/family education;Manual techniques    PT Next Visit Plan  Berg next session to address STG, continue balance and strength    Recommended Other Services  initial plan of care is signed.      Consulted and Agree with Plan of Care  Patient       Patient will  benefit from skilled therapeutic intervention in order to improve the following deficits and impairments:  Abnormal gait, Decreased balance, Decreased safety awareness, Decreased strength, Difficulty walking, Impaired flexibility, Postural dysfunction, Pain  Visit Diagnosis: Weakness generalized  Unsteadiness on feet  Difficulty in walking, not elsewhere classified   G-Codes - 03-May-2017 1415    Functional Assessment Tool Used (Outpatient Only)  TUG, clinical judgement    Functional Limitation  Mobility: Walking and moving around    Mobility: Walking and Moving Around Current Status (W2993)  At least 40 percent but less than 60 percent impaired, limited or restricted    Mobility: Walking and Moving Around Goal Status [phone])  At least 20 percent but less than 40 percent impaired, limited or restricted       Problem List Patient Active Problem List   Diagnosis Date Noted  . Neck pain   . Acute deep vein thrombosis (DVT) of proximal vein of lower extremity (HCC)   . Bilateral pulmonary embolism ([place]) 01/08/2017  . Abnormality of gait 11/05/2013  . OSA (obstructive sleep apnea) 07/23/2013  . Erectile dysfunction 07/23/2013  . CAD (coronary artery disease), Hx of LAD DES in 2005 04/15/2013  . Peripheral neuropathy 04/15/2013  . Hyperlipidemia with target LDL less than 70 04/15/2013     Sigurd Sos, PT 05/03/2017 2:46 PM  [facility] [address] [place], [place], [address] Phone: [phone]   Fax:  [phone]  Name: Corey Larson. MRN: [id number] Date of Birth: [birth year]

## 2017-04-09 ENCOUNTER — Ambulatory Visit: Payer: PRIVATE HEALTH INSURANCE | Admitting: Physical Therapy

## 2017-04-09 DIAGNOSIS — H353221 Exudative age-related macular degeneration, left eye, with active choroidal neovascularization: Secondary | ICD-10-CM | POA: Diagnosis not present

## 2017-04-10 ENCOUNTER — Encounter: Payer: PRIVATE HEALTH INSURANCE | Admitting: Physical Therapy

## 2017-04-11 ENCOUNTER — Ambulatory Visit: Payer: Medicare Other | Admitting: Physical Therapy

## 2017-04-11 ENCOUNTER — Encounter: Payer: Self-pay | Admitting: Physical Therapy

## 2017-04-11 DIAGNOSIS — R262 Difficulty in walking, not elsewhere classified: Secondary | ICD-10-CM

## 2017-04-11 DIAGNOSIS — R531 Weakness: Secondary | ICD-10-CM | POA: Diagnosis not present

## 2017-04-11 DIAGNOSIS — G8929 Other chronic pain: Secondary | ICD-10-CM | POA: Diagnosis not present

## 2017-04-11 DIAGNOSIS — R2681 Unsteadiness on feet: Secondary | ICD-10-CM | POA: Diagnosis not present

## 2017-04-11 DIAGNOSIS — M545 Low back pain: Secondary | ICD-10-CM | POA: Diagnosis not present

## 2017-04-11 NOTE — Therapy (Signed)
[facility] [address], [place], [place], [address] Phone: [phone]   Fax:  [phone]  Physical Therapy Treatment  Patient Details  Name: Corey Larson. MRN: [id number] Date of Birth: 02-11-1928 Referring Provider: Dr. [name]   Encounter Date: 04/11/2017  PT End of Session - 04/11/17 1137    Visit Number  11    Number of Visits  20    Date for PT Re-Evaluation  04/30/17    Authorization Type  G codes;  KX now because of previous PT    PT Start Time  1100    PT Stop Time  1140    PT Time Calculation (min)  40 min    Activity Tolerance  Patient tolerated treatment well       Past Medical History:  Diagnosis Date  . Abnormality of gait 11/05/2013  . CAD (coronary artery disease) 2005   stent to LAD by Dr. Glade Lloyd. mild irregulatities of RCA.    [name] CRF (chronic renal failure)   . ED (erectile dysfunction)   . H/O exercise stress test 05/2012   no statistically significant ischemia.   [name] History of TIA (transient ischemic attack)    transient confusion  . HOH (hard of hearing)    hearing aids  . Hyperlipidemia LDL goal < 70   . Hypertension   . Insomnia   . Macular degeneration    bilateral  . Macular degeneration    Right>left  . NSTEMI (non-ST elevated myocardial infarction) ([place]) 04/16/2013  . OSA (obstructive sleep apnea)    mild, never on cpap  . Peripheral neuropathy   . Vitamin D deficiency     Past Surgical History:  Procedure Laterality Date  . ANKLE SURGERY     right ankle 2002 fracture  . BACK SURGERY  1976   laminectomy  . CARDIAC CATHETERIZATION     2005, stent placed DES LAD  . CATARACT EXTRACTION     bilateral  . CORONARY ANGIOPLASTY    . HERNIA REPAIR  1981   "double hernia"  . KNEE SURGERY     right knee 2011   . NM MYOCAR MULTIPLE W/SPECT  05/13/2012   MILD UPPER SEPTAL THINNING AND DIAPHRAGMATIC ATTENUATION W/O SIGNIFICANT ISCHEMIA. EF 66%.  . TRANSTHORACIC  ECHOCARDIOGRAM  05/12/2010   EF =>55%. MILD CONCENTRIC LVH. TRACE MITRAL REGURG.    There were no vitals filed for this visit.  Subjective Assessment - 04/11/17 1103    Subjective  I just got done eating breakfast at [facility].  My feet are really bothering me this morning.      Currently in Pain?  Yes    Pain Score  5     Pain Location  Foot    Pain Orientation  Right;Left    Aggravating Factors   my feet hurt all the time         [facility] PT Assessment - 04/11/17 0001      Flexibility   Hamstrings  65 degrees bil    Quadriceps  hip flexors 5 degrees bil      Berg Balance Test   Sit to Stand  Able to stand without using hands and stabilize independently    Standing Unsupported  Able to stand safely 2 minutes    Sitting with Back Unsupported but Feet Supported on Floor or Stool  Able to sit safely and securely 2 minutes    Stand to Sit  Sits safely with minimal use  of hands    Transfers  Able to transfer safely, minor use of hands    Standing Unsupported with Eyes Closed  Able to stand 10 seconds with supervision    Standing Ubsupported with Feet Together  Able to place feet together independently and stand for 1 minute with supervision    From Standing, Reach Forward with Outstretched Arm  Can reach forward >12 cm safely (5")    From Standing Position, Pick up Object from [place] to pick up shoe, needs supervision    From Standing Position, Turn to Look Behind Over each Shoulder  Looks behind one side only/other side shows less weight shift    Turn 360 Degrees  Able to turn 360 degrees safely but slowly    Standing Unsupported, Alternately Place Feet on Step/Stool  Able to complete 4 steps without aid or supervision    Standing Unsupported, One Foot in [place] to take small step independently and hold 30 seconds    Standing on One Leg  Tries to lift leg/unable to hold 3 seconds but remains standing independently    Total Score  42    Berg comment:  moderate risk for falls  recommended cane and walker                  [facility] Adult PT Treatment/Exercise - 04/11/17 0001      Therapeutic Activites    Other Therapeutic Activities  sit to stand, standing, upright posture      Neuro Re-ed    Neuro Re-ed Details   dynamic and static standing balance      Knee/Hip Exercises: Stretches   Active Hamstring Stretch  Right;Left;5 reps    Active Hamstring Stretch Limitations  seated with foot stool    Quad Stretch  Both;5 reps    Hip Flexor Stretch  -- on 2nd step      Knee/Hip Exercises: Aerobic   Nustep  L2 x 10 min Monitored and discussion of progression      Knee/Hip Exercises: Standing   Heel Raises  Both;20 reps UE asst     SLS  10 sec hold 3x single UE assist    Gait Training  wall climbs 10x    Other Standing Knee Exercises  step taps onto 2nd step 10x    Other Standing Knee Exercises  step and reach forward 5x right/left      Knee/Hip Exercises: Seated   Long Arc Quad  Strengthening;Right;Left;10 reps    Long Arc Quad Limitations  red band    Clamshell with TheraBand  Green    Knee/Hip Flexion  red band 10x bil                PT Short Term Goals - 04/11/17 1124      PT SHORT TERM GOAL #1   Title  Pt will be independent in initial HEP    Status  Partially Met      PT SHORT TERM GOAL #2   Title  Improved bil HS length to 65 degrees and hip flexor lengths to 5 degrees needed for improved step length    Status  Achieved      PT SHORT TERM GOAL #3   Title  Pt will demo improved Berg to 40/56 for decreased falls risk    Status  Achieved        PT Long Term Goals - 03/07/17 1548      PT LONG TERM GOAL #1  Title  Pt will demo improved Berg to >/= 43/56 for decreased falls risk    Time  8    Period  Weeks    Status  On-going      PT LONG TERM GOAL #2   Title  The patient will be independent in safe self progression of HEP for futher improvements in balance    Time  8    Period  Weeks    Status  On-going      PT LONG  TERM GOAL #3   Title  Pt will demo improved bil LE strength grossly 4/5 to assist with more stability with gait    Time  8    Period  Weeks    Status  On-going      PT LONG TERM GOAL #4   Title  Pt will demo improved stability with ambulation with RW or least restrictive assistive device level/unlevel surfaces to assist with out of home outings    Time  8    Period  Weeks    Status  On-going      PT LONG TERM GOAL #5   Title  Timed up and Go to 14 sec indicated improved gait velocity and safety for community ambulation    Time  8    Period  Weeks    Status  On-going            Plan - 04/11/17 1137    Clinical Impression Statement  The patient has improved by 4 points with BERG balance test score and is moderate risk for falls.  Recommend use of rolling walker or at the very least a cane.  Although he has both at home, he states "I haven't used that yet."  Also discussed pausing upon rising from the chair secondary to his tendency for forward propulsion initially.  Progressing with rehab goals.      Rehab Potential  Good    Clinical Impairments Affecting Rehab Potential   peripheral neuropathy    PT Frequency  2x / week    PT Duration  8 weeks    PT Treatment/Interventions  ADLs/Self Care Home Management;Functional mobility training;Stair training;Gait training;DME Instruction;Therapeutic activities;Therapeutic exercise;Balance training;Neuromuscular re-education;Patient/family education;Manual techniques    PT Next Visit Plan   continue balance and strength; narrow base of support;  pause after rising        Patient will benefit from skilled therapeutic intervention in order to improve the following deficits and impairments:  Abnormal gait, Decreased balance, Decreased safety awareness, Decreased strength, Difficulty walking, Impaired flexibility, Postural dysfunction, Pain  Visit Diagnosis: Weakness generalized  Unsteadiness on feet  Difficulty in walking, not elsewhere  classified     Problem List Patient Active Problem List   Diagnosis Date Noted  . Neck pain   . Acute deep vein thrombosis (DVT) of proximal vein of lower extremity (HCC)   . Bilateral pulmonary embolism ([place]) 01/08/2017  . Abnormality of gait 11/05/2013  . OSA (obstructive sleep apnea) 07/23/2013  . Erectile dysfunction 07/23/2013  . CAD (coronary artery disease), Hx of LAD DES in 2005 04/15/2013  . Peripheral neuropathy 04/15/2013  . Hyperlipidemia with target LDL less than 70 04/15/2013   Ruben Im, PT 04/11/17 5:18 PM Phone: [phone] Fax: [phone]  Alvera Singh 04/11/2017, 5:17 PM  [facility] [address], [place], [place], [address] Phone: [phone]   Fax:  [phone]  Name: Aarnav Steagall. MRN: [id number] Date of Birth: 10/08/1927

## 2017-04-15 ENCOUNTER — Encounter: Payer: Self-pay | Admitting: Neurology

## 2017-04-15 ENCOUNTER — Ambulatory Visit (INDEPENDENT_AMBULATORY_CARE_PROVIDER_SITE_OTHER): Payer: Medicare Other | Admitting: Neurology

## 2017-04-15 VITALS — BP 129/82 | HR 87 | Ht 67.0 in | Wt 154.5 lb

## 2017-04-15 DIAGNOSIS — G603 Idiopathic progressive neuropathy: Secondary | ICD-10-CM

## 2017-04-15 DIAGNOSIS — R269 Unspecified abnormalities of gait and mobility: Secondary | ICD-10-CM

## 2017-04-15 DIAGNOSIS — R413 Other amnesia: Secondary | ICD-10-CM | POA: Diagnosis not present

## 2017-04-15 DIAGNOSIS — E538 Deficiency of other specified B group vitamins: Secondary | ICD-10-CM | POA: Diagnosis not present

## 2017-04-15 HISTORY — DX: Other amnesia: R41.3

## 2017-04-15 NOTE — Progress Notes (Signed)
Reason for visit: Memory disturbance  Referring physician: Dr. Sheliah Hatch. is a 83 y.o. male  History of present illness:  Mr. Langlois is an 83 year old right-handed white male with a history of a peripheral neuropathy, last seen through this office over 3 years ago.  Around that time, the patient had reported some memory issues that were in part associated with the use of a combination of Restoril and clonazepam.  The patient has continued to have some mild memory problems over the years that has not progressed much.  The patient was recently in the hospital in August 2018 following a pulmonary embolism.  He went into a delirium state during that hospitalization requiring the use of Ativan and Haldol.  The wife indicates that he took 2 months to fully improve following this hospitalization.  The patient began having some increased problems with memory and altered cognitive functioning in the spring 2018.  The patient stopped taking his medications and stopped paying the bills.  The wife has taken over the finances since that time.  The patient seems to have improved significantly over the last couple months, he will have good days and bad days with her memory.  The patient still operates a motor vehicle during the daytime, he denies any problems with directions or safety with driving.  He does have macular degeneration and he has difficulty with vision in the right greater than left eye.  The patient has chronic insomnia, sleeping medications in the past have resulted in wandering at night.  The patient will fall on occasion, he does have a peripheral neuropathy.  He takes gabapentin 800 mg in the evening for this.  The patient does not use a cane for ambulation, he is in physical therapy currently.  The patient reports numbness in both feet mainly on the bottoms of the feet.  He occasionally will catch his right toe when walking.  The patient is able to keep up with medications and  appointments at this time.  He is sent to this office for further evaluation.  He was given a prescription for Aricept, but he is not on this medication.  He has lost about 20 pounds over the last several months.  Past Medical History:  Diagnosis Date  . Abnormality of gait 11/05/2013  . CAD (coronary artery disease) 2005   stent to LAD by Dr. Glade Lloyd. mild irregulatities of RCA.    [name] CRF (chronic renal failure)   . ED (erectile dysfunction)   . H/O exercise stress test 05/2012   no statistically significant ischemia.   [name] History of TIA (transient ischemic attack)    transient confusion  . HOH (hard of hearing)    hearing aids  . Hyperlipidemia LDL goal < 70   . Hypertension   . Insomnia   . Macular degeneration    bilateral  . Macular degeneration    Right>left  . NSTEMI (non-ST elevated myocardial infarction) ([place]) 04/16/2013  . OSA (obstructive sleep apnea)    mild, never on cpap  . Peripheral neuropathy   . Vitamin D deficiency     Past Surgical History:  Procedure Laterality Date  . ANKLE SURGERY     right ankle 2002 fracture  . BACK SURGERY  1976   laminectomy  . CARDIAC CATHETERIZATION     2005, stent placed DES LAD  . CATARACT EXTRACTION     bilateral  . CORONARY ANGIOPLASTY    . HERNIA REPAIR  1981   "  double hernia"  . KNEE SURGERY     right knee 2011   . NM MYOCAR MULTIPLE W/SPECT  05/13/2012   MILD UPPER SEPTAL THINNING AND DIAPHRAGMATIC ATTENUATION W/O SIGNIFICANT ISCHEMIA. EF 66%.  . TRANSTHORACIC ECHOCARDIOGRAM  05/12/2010   EF =>55%. MILD CONCENTRIC LVH. TRACE MITRAL REGURG.    Family History  Problem Relation Age of Onset  . Diabetes Sister   . Alcohol abuse Brother   . Cancer Maternal Grandmother   . Heart disease Paternal Grandfather     Social history:  reports that he quit smoking about 34 years ago. His smoking use included cigarettes and pipe. He quit after 30.00 years of use. he has never used smokeless tobacco. He reports that he drinks  alcohol. He reports that he does not use drugs.  Medications:  Prior to Admission medications   Medication Sig Start Date End Date Taking? Authorizing Provider  albuterol (PROVENTIL HFA;VENTOLIN HFA) 108 (90 BASE) MCG/ACT inhaler Inhale 2 puffs into the lungs every 6 (six) hours as needed for wheezing or shortness of breath. 08/09/14  Yes Donne Hazel, MD  B Complex-C (B-COMPLEX WITH VITAMIN C) tablet Take 1 tablet by mouth daily.   Yes [provider]  calcium carbonate (TUMS - DOSED IN MG ELEMENTAL CALCIUM) 500 MG chewable tablet Chew 1 tablet by mouth as needed for indigestion or heartburn.   Yes [provider]  Cholecalciferol (VITAMIN D PO) Take 1 tablet by mouth daily.   Yes [provider]  gabapentin (NEURONTIN) 800 MG tablet Take 800 mg as needed by mouth.    Yes [provider]  Liniments (BLUE-EMU SUPER STRENGTH) CREA Apply 1 application topically 2 (two) times daily. Use on both feet   Yes [provider]  Multiple Vitamins-Minerals (MULTIVITAMIN ADULT PO) Take 1 tablet by mouth daily as needed. Takes One-A-Day for Men   Yes [provider]  nitroGLYCERIN (NITROSTAT) 0.4 MG SL tablet Place 1 tablet (0.4 mg total) under the tongue every 5 (five) minutes x 3 doses as needed for chest pain. 04/26/14  Yes Troy Sine, MD  NONFORMULARY OR COMPOUNDED ITEM [facility]  Peripheral Neuropathy Cream- Bupivacaine 1%, Doxepin 3%, Gabapentin 6%, Pentoxifylline 3%, Topiramate 1% Apply 1-2 grams to affected area 3-4 times daily Qty. 120 gm 3 refills 09/07/16  Yes Edrick Kins, DPM  omeprazole (PRILOSEC) 20 MG capsule Take 20 mg by mouth daily.   Yes [provider]  ranitidine (ZANTAC) 75 MG tablet Take 75 mg by mouth daily as needed for heartburn.   Yes [provider]  Rivaroxaban 15 & 20 MG TBPK Take as directed on package: Start with one 15mg  tablet by mouth twice a day with food. On Day 22, switch to one 20mg   tablet once a day with food. Patient taking differently: Take 20 mg by mouth. Take as directed on package: Start with one 15mg  tablet by mouth twice a day with food. On Day 22, switch to one 20mg  tablet once a day with food. 01/11/17  Yes Elgergawy, Silver Huguenin, MD      Allergies  Allergen Reactions  . Klonopin [Clonazepam] Other (See Comments)    Sleep walking episodes  . Ambien [Zolpidem] Other (See Comments)    Sleep walking episodes   . Belsomra [Suvorexant]     Sleep walking    ROS:  Out of a complete 14 system review of symptoms, the patient complains only of the following symptoms, and all other reviewed systems are  negative.  Fevers, chills Hearing loss Loss of vision Shortness of breath Easy bruising Memory loss, confusion Not enough sleep Insomnia  Blood pressure 129/82, pulse 87, height 5\' 7"  (1.702 m), weight 154 lb 8 oz (70.1 kg).  Physical Exam  General: The patient is alert and cooperative at the time of the examination.  Eyes: Pupils are equal, round, and reactive to light. Discs are flat bilaterally.  Neck: The neck is supple, no carotid bruits are noted.  Respiratory: The respiratory examination is clear.  Cardiovascular: The cardiovascular examination reveals a regular rate and rhythm, no obvious murmurs or rubs are noted.  Skin: Extremities are without significant edema.  Neurologic Exam  Mental status: The patient is alert and oriented x 3 at the time of the examination. The patient has apparent normal recent and remote memory, with an apparently normal attention span and concentration ability.  Mini-Mental status examination done today shows a total score of 28/30.  Cranial nerves: Facial symmetry is present. There is good sensation of the face to pinprick and soft touch bilaterally. The strength of the facial muscles and the muscles to head turning and shoulder shrug are normal bilaterally. Speech is well enunciated, no aphasia or dysarthria is  noted. Extraocular movements are full. Visual fields are full. The tongue is midline, and the patient has symmetric elevation of the soft palate. No obvious hearing deficits are noted.  Motor: The motor testing reveals 5 over 5 strength of all 4 extremities, with exception of mild weakness of the intrinsic muscles of the hands bilaterally, mild foot drops bilaterally. Good symmetric motor tone is noted throughout.  Sensory: Sensory testing is intact to pinprick, soft touch, vibration sensation, and position sense on all 4 extremities, with exception of decreased position sense in both feet. No evidence of extinction is noted.  Coordination: Cerebellar testing reveals good finger-nose-finger and heel-to-shin bilaterally.  Gait and station: Gait is slightly wide-based, the patient can walk independently.  Tandem gait is unsteady.  Romberg is negative. No drift is seen.  Reflexes: Deep tendon reflexes are symmetric, but are decreased bilaterally. Toes are downgoing bilaterally.   MRI brain 11/12/13:   IMPRESSION:  Abnormal MRI scan of the brain showing mild changes of chronic microvascular ischemia and generalized cerebral atrophy.  * MRI scan images were reviewed online. I agree with the written report.   Assessment/Plan:  1.  Peripheral neuropathy  2.  Memory disturbance  3.  Gait disturbance  The patient does have an underlying memory disorder.  He has recently had a delirium state during a hospitalization in August 2018.  He has progressed very little over the last 3 years, in 2015 he scored 22/30 on the Mini-Mental Status Examination, he scored 26/30 in the office of Dr. Maudie Mercury recently and he scored 28/30 today.  The patient has been given a prescription for Aricept, he is not on this medication.  He has lost about 20 pounds of weight recently.  The patient will be sent for a vitamin B12 level, if his weight stabilizes, he may be able to start on the Aricept.  He will follow-up in 6  months.  Jill Alexanders MD 04/15/2017 8:27 AM  [facility] [address] [place], [place] [address]  Phone [phone] Fax [phone]

## 2017-04-16 ENCOUNTER — Ambulatory Visit: Payer: Medicare Other | Admitting: Physical Therapy

## 2017-04-16 ENCOUNTER — Telehealth: Payer: Self-pay | Admitting: Neurology

## 2017-04-16 DIAGNOSIS — R262 Difficulty in walking, not elsewhere classified: Secondary | ICD-10-CM

## 2017-04-16 DIAGNOSIS — R531 Weakness: Secondary | ICD-10-CM | POA: Diagnosis not present

## 2017-04-16 DIAGNOSIS — G8929 Other chronic pain: Secondary | ICD-10-CM

## 2017-04-16 DIAGNOSIS — R2681 Unsteadiness on feet: Secondary | ICD-10-CM | POA: Diagnosis not present

## 2017-04-16 DIAGNOSIS — M545 Low back pain: Secondary | ICD-10-CM

## 2017-04-16 LAB — VITAMIN B12: VITAMIN B 12: 182 pg/mL — AB (ref 232–1245)

## 2017-04-16 NOTE — Telephone Encounter (Signed)
I called the patient.  The vitamin B12 level was low, we will have him come in for a B12 shot and then get him started on vitamin B12 tablets 1000 mcg daily.

## 2017-04-16 NOTE — Telephone Encounter (Signed)
Noted, thank you

## 2017-04-16 NOTE — Therapy (Signed)
[facility] [address], [place], [place], [address] Phone: [phone]   Fax:  [phone]  Physical Therapy Treatment  Patient Details  Name: Corey Larson. MRN: [id number] Date of Birth: 1928-01-05 Referring Provider: Dr. [name]   Encounter Date: 04/16/2017  PT End of Session - 04/16/17 1138    Visit Number  12    Number of Visits  20    Date for PT Re-Evaluation  04/30/17    Authorization Type  G codes;  KX now because of previous PT    PT Start Time  1059    PT Stop Time  1139    PT Time Calculation (min)  40 min    Activity Tolerance  Patient tolerated treatment well       Past Medical History:  Diagnosis Date  . Abnormality of gait 11/05/2013  . CAD (coronary artery disease) 2005   stent to LAD by Dr. Glade Lloyd. mild irregulatities of RCA.    [name] CRF (chronic renal failure)   . ED (erectile dysfunction)   . H/O exercise stress test 05/2012   no statistically significant ischemia.   [name] History of TIA (transient ischemic attack)    transient confusion  . HOH (hard of hearing)    hearing aids  . Hyperlipidemia LDL goal < 70   . Hypertension   . Insomnia   . Macular degeneration    bilateral  . Macular degeneration    Right>left  . Memory difficulty 04/15/2017  . NSTEMI (non-ST elevated myocardial infarction) ([place]) 04/16/2013  . OSA (obstructive sleep apnea)    mild, never on cpap  . Peripheral neuropathy   . Vitamin D deficiency     Past Surgical History:  Procedure Laterality Date  . ANKLE SURGERY     right ankle 2002 fracture  . BACK SURGERY  1976   laminectomy  . CARDIAC CATHETERIZATION     2005, stent placed DES LAD  . CATARACT EXTRACTION     bilateral  . CORONARY ANGIOPLASTY    . HERNIA REPAIR  1981   "double hernia"  . KNEE SURGERY     right knee 2011   . NM MYOCAR MULTIPLE W/SPECT  05/13/2012   MILD UPPER SEPTAL THINNING AND DIAPHRAGMATIC ATTENUATION W/O SIGNIFICANT  ISCHEMIA. EF 66%.  . TRANSTHORACIC ECHOCARDIOGRAM  05/12/2010   EF =>55%. MILD CONCENTRIC LVH. TRACE MITRAL REGURG.    There were no vitals filed for this visit.  Subjective Assessment - 04/16/17 1100    Subjective  This (Nu-Step) is the easiest thing I do in my workout.  No assistive device.  Ran errands with his wife yesterday.  "I'm aware of my balance.  I get caught up in the carpets at home.  It reminds me to pick my feet up."    Currently in Pain?  No/denies    Pain Score  0-No pain                      [facility] Adult PT Treatment/Exercise - 04/16/17 0001      Therapeutic Activites    Other Therapeutic Activities  sit to stand, standing, upright posture      Neuro Re-ed    Neuro Re-ed Details   dynamic and static standing balance      Knee/Hip Exercises: Stretches   Active Hamstring Stretch  Right;Left;5 reps    Active Hamstring Stretch Limitations  on 2nd step    Sports administrator  Both;5 reps on 2nd step      Knee/Hip Exercises: Aerobic   Nustep  L2 x 10 min Monitored and discussion of progression      Knee/Hip Exercises: Standing   Heel Raises  Both;20 reps UE asst     Gait Training  wall climbs 10x    Other Standing Knee Exercises  standing on black foam weight shift, staggered stand weight shift, step taps 1 min each; reaching forward     Other Standing Knee Exercises  step and reach forward 5x right/left stepping over small noodle      Knee/Hip Exercises: Seated   Long Arc Quad Limitations  red band 2x10    Clamshell with TheraBand  Green 20x    Other Seated Knee/Hip Exercises  foam roll push down 10x    Other Seated Knee/Hip Exercises  thoracic extension over foam roll 10x    Sit to Sand  5 reps;without UE support pause and walk               PT Short Term Goals - 04/11/17 1124      PT SHORT TERM GOAL #1   Title  Pt will be independent in initial HEP    Status  Partially Met      PT SHORT TERM GOAL #2   Title  Improved bil HS length to 65  degrees and hip flexor lengths to 5 degrees needed for improved step length    Status  Achieved      PT SHORT TERM GOAL #3   Title  Pt will demo improved Berg to 40/56 for decreased falls risk    Status  Achieved        PT Long Term Goals - 03/07/17 1548      PT LONG TERM GOAL #1   Title  Pt will demo improved Berg to >/= 43/56 for decreased falls risk    Time  8    Period  Weeks    Status  On-going      PT LONG TERM GOAL #2   Title  The patient will be independent in safe self progression of HEP for futher improvements in balance    Time  8    Period  Weeks    Status  On-going      PT LONG TERM GOAL #3   Title  Pt will demo improved bil LE strength grossly 4/5 to assist with more stability with gait    Time  8    Period  Weeks    Status  On-going      PT LONG TERM GOAL #4   Title  Pt will demo improved stability with ambulation with RW or least restrictive assistive device level/unlevel surfaces to assist with out of home outings    Time  8    Period  Weeks    Status  On-going      PT LONG TERM GOAL #5   Title  Timed up and Go to 14 sec indicated improved gait velocity and safety for community ambulation    Time  8    Period  Weeks    Status  On-going            Plan - 04/16/17 1139    Clinical Impression Statement  Patient reports his legs get really tired with standing 5-10 minutes at a time.  With cues and practice he is able to pause upon rising (as opposed to moving forward too quickly and losing his  balance).  Will need continued practice for better carryover.  He continues to decline using an assistive device.  Should meet remaining goals in next 2 weeks.      Rehab Potential  Good    Clinical Impairments Affecting Rehab Potential   peripheral neuropathy    PT Frequency  2x / week    PT Duration  8 weeks    PT Treatment/Interventions  ADLs/Self Care Home Management;Functional mobility training;Stair training;Gait training;DME Instruction;Therapeutic  activities;Therapeutic exercise;Balance training;Neuromuscular re-education;Patient/family education;Manual techniques    PT Next Visit Plan   continue balance and strength; narrow base of support;  pause after rising ; do TUG next visit.       Patient will benefit from skilled therapeutic intervention in order to improve the following deficits and impairments:  Abnormal gait, Decreased balance, Decreased safety awareness, Decreased strength, Difficulty walking, Impaired flexibility, Postural dysfunction, Pain  Visit Diagnosis: Weakness generalized  Unsteadiness on feet  Difficulty in walking, not elsewhere classified  Chronic bilateral low back pain without sciatica     Problem List Patient Active Problem List   Diagnosis Date Noted  . Memory difficulty 04/15/2017  . Neck pain   . Acute deep vein thrombosis (DVT) of proximal vein of lower extremity (HCC)   . Bilateral pulmonary embolism ([place]) 01/08/2017  . Abnormality of gait 11/05/2013  . OSA (obstructive sleep apnea) 07/23/2013  . Erectile dysfunction 07/23/2013  . CAD (coronary artery disease), Hx of LAD DES in 2005 04/15/2013  . Peripheral neuropathy 04/15/2013  . Hyperlipidemia with target LDL less than 70 04/15/2013   Ruben Im, PT 04/16/17 11:46 AM Phone: [phone] Fax: [phone]  Alvera Singh 04/16/2017, 11:45 AM  [facility] [address], [place], [place], [address] Phone: [phone]   Fax:  [phone]  Name: Corey Larson. MRN: [id number] Date of Birth: Nov 30, 1927

## 2017-04-16 NOTE — Telephone Encounter (Signed)
Called and LVM for pt to call office back. Was calling to see if he can come tomorrow at 130pm for B12 injection per CW,MD request.   If okay, please place on nurse schedule at 130pm, thank you

## 2017-04-16 NOTE — Telephone Encounter (Signed)
I scheduled patient tomorrow at 1:30pm check in 1:00pm for a B-12 injection with nurse per previous message.

## 2017-04-17 ENCOUNTER — Ambulatory Visit (INDEPENDENT_AMBULATORY_CARE_PROVIDER_SITE_OTHER): Payer: Medicare Other | Admitting: *Deleted

## 2017-04-17 DIAGNOSIS — E538 Deficiency of other specified B group vitamins: Secondary | ICD-10-CM

## 2017-04-17 MED ORDER — CYANOCOBALAMIN 1000 MCG/ML IJ SOLN
1000.0000 ug | Freq: Once | INTRAMUSCULAR | Status: AC
  Administered 2017-04-17: 1000 ug via INTRAMUSCULAR

## 2017-04-17 NOTE — Progress Notes (Addendum)
GaveVitamin B12 1000mcgIM in right deltoid. Cleaned with alcohol wipe prior to injection. Band-aid applied. Pt tolerated well.  

## 2017-04-19 ENCOUNTER — Ambulatory Visit: Payer: Medicare Other | Admitting: Physical Therapy

## 2017-04-19 DIAGNOSIS — R531 Weakness: Secondary | ICD-10-CM

## 2017-04-19 DIAGNOSIS — R262 Difficulty in walking, not elsewhere classified: Secondary | ICD-10-CM

## 2017-04-19 DIAGNOSIS — M545 Low back pain: Secondary | ICD-10-CM | POA: Diagnosis not present

## 2017-04-19 DIAGNOSIS — R2681 Unsteadiness on feet: Secondary | ICD-10-CM

## 2017-04-19 DIAGNOSIS — G8929 Other chronic pain: Secondary | ICD-10-CM | POA: Diagnosis not present

## 2017-04-19 NOTE — Therapy (Signed)
[facility] [address], [place], [place], [address] Phone: [phone]   Fax:  [phone]  Physical Therapy Treatment  Patient Details  Name: Corey Larson. MRN: [id number] Date of Birth: 1927-10-07 Referring Provider: Dr. [name]   Encounter Date: 04/19/2017  PT End of Session - 04/19/17 1026    Visit Number  13    Number of Visits  20    Date for PT Re-Evaluation  04/30/17    Authorization Type  G codes;  KX now because of previous PT    PT Start Time  1015    PT Stop Time  1100    PT Time Calculation (min)  45 min    Activity Tolerance  Patient tolerated treatment well       Past Medical History:  Diagnosis Date  . Abnormality of gait 11/05/2013  . CAD (coronary artery disease) 2005   stent to LAD by Dr. Glade Lloyd. mild irregulatities of RCA.    [name] CRF (chronic renal failure)   . ED (erectile dysfunction)   . H/O exercise stress test 05/2012   no statistically significant ischemia.   [name] History of TIA (transient ischemic attack)    transient confusion  . HOH (hard of hearing)    hearing aids  . Hyperlipidemia LDL goal < 70   . Hypertension   . Insomnia   . Macular degeneration    bilateral  . Macular degeneration    Right>left  . Memory difficulty 04/15/2017  . NSTEMI (non-ST elevated myocardial infarction) ([place]) 04/16/2013  . OSA (obstructive sleep apnea)    mild, never on cpap  . Peripheral neuropathy   . Vitamin D deficiency     Past Surgical History:  Procedure Laterality Date  . ANKLE SURGERY     right ankle 2002 fracture  . BACK SURGERY  1976   laminectomy  . CARDIAC CATHETERIZATION     2005, stent placed DES LAD  . CATARACT EXTRACTION     bilateral  . CORONARY ANGIOPLASTY    . HERNIA REPAIR  1981   "double hernia"  . KNEE SURGERY     right knee 2011   . LEFT HEART CATHETERIZATION WITH CORONARY ANGIOGRAM N/A 04/16/2013   Performed by Leonie Man, MD at [facility] CATH LAB   . NM MYOCAR MULTIPLE W/SPECT  05/13/2012   MILD UPPER SEPTAL THINNING AND DIAPHRAGMATIC ATTENUATION W/O SIGNIFICANT ISCHEMIA. EF 66%.  . TRANSTHORACIC ECHOCARDIOGRAM  05/12/2010   EF =>55%. MILD CONCENTRIC LVH. TRACE MITRAL REGURG.    There were no vitals filed for this visit.  Subjective Assessment - 04/19/17 1018    Subjective  My wife got along real well with her cataract surgery.  I'm still sleepy but that's typical for me.      Currently in Pain?  No/denies    Pain Score  0-No pain         [facility] PT Assessment - 04/19/17 0001      Timed Up and Go Test   TUG  Normal TUG    Normal TUG (seconds)  13    TUG Comments  Normal                  [facility] Adult PT Treatment/Exercise - 04/19/17 0001      Therapeutic Activites    Other Therapeutic Activities  sit to stand, standing, upright posture      Neuro Re-ed    Neuro Re-ed Details   dynamic  and static standing balance      Knee/Hip Exercises: Stretches   Active Hamstring Stretch  Right;Left;5 reps    Active Hamstring Stretch Limitations  on 2nd step    Quad Stretch  Both;5 reps on 2nd step      Knee/Hip Exercises: Aerobic   Nustep  L2 x 10 min Monitored and discussion of progression      Knee/Hip Exercises: Standing   Heel Raises  Both;20 reps UE asst     Forward Step Up  Right;Left;5 reps    Other Standing Knee Exercises  standing on black foam weight shift, staggered stand weight shift, step taps 1 min each; reaching forward     Other Standing Knee Exercises  step and reach forward 5x right/left      Knee/Hip Exercises: Seated   Long Arc Quad Limitations  red band 2x10    Clamshell with TheraBand  Green 20x    Sit to [employer]  5 reps;without UE support pause and walk               PT Short Term Goals - 04/11/17 1124      PT SHORT TERM GOAL #1   Title  Pt will be independent in initial HEP    Status  Partially Met      PT SHORT TERM GOAL #2   Title  Improved bil HS length to 65 degrees and hip  flexor lengths to 5 degrees needed for improved step length    Status  Achieved      PT SHORT TERM GOAL #3   Title  Pt will demo improved Berg to 40/56 for decreased falls risk    Status  Achieved        PT Long Term Goals - 03/07/17 1548      PT LONG TERM GOAL #1   Title  Pt will demo improved Berg to >/= 43/56 for decreased falls risk    Time  8    Period  Weeks    Status  On-going      PT LONG TERM GOAL #2   Title  The patient will be independent in safe self progression of HEP for futher improvements in balance    Time  8    Period  Weeks    Status  On-going      PT LONG TERM GOAL #3   Title  Pt will demo improved bil LE strength grossly 4/5 to assist with more stability with gait    Time  8    Period  Weeks    Status  On-going      PT LONG TERM GOAL #4   Title  Pt will demo improved stability with ambulation with RW or least restrictive assistive device level/unlevel surfaces to assist with out of home outings    Time  8    Period  Weeks    Status  On-going      PT LONG TERM GOAL #5   Title  Timed up and Go to 14 sec indicated improved gait velocity and safety for community ambulation    Time  8    Period  Weeks    Status  On-going            Plan - 04/19/17 1026    Clinical Impression Statement  During ex session, patient reports his back bothers him a little bit.  No change this week in Timed  Up and Go test.  Patient continues to fatigue in  standing.  Close supervision for safety with standing balance exercises.   Continues to need cues to pause upon rising to avoid excessive forward propulsion.      Rehab Potential  Good    PT Frequency  2x / week    PT Duration  8 weeks    PT Next Visit Plan   continue balance and strength; narrow base of support;  pause after rising        Patient will benefit from skilled therapeutic intervention in order to improve the following deficits and impairments:  Abnormal gait, Decreased balance, Decreased safety  awareness, Decreased strength, Difficulty walking, Impaired flexibility, Postural dysfunction, Pain  Visit Diagnosis: Weakness generalized  Unsteadiness on feet  Difficulty in walking, not elsewhere classified     Problem List Patient Active Problem List   Diagnosis Date Noted  . Memory difficulty 04/15/2017  . Neck pain   . Acute deep vein thrombosis (DVT) of proximal vein of lower extremity (HCC)   . Bilateral pulmonary embolism ([place]) 01/08/2017  . Abnormality of gait 11/05/2013  . OSA (obstructive sleep apnea) 07/23/2013  . Erectile dysfunction 07/23/2013  . CAD (coronary artery disease), Hx of LAD DES in 2005 04/15/2013  . Peripheral neuropathy 04/15/2013  . Hyperlipidemia with target LDL less than 70 04/15/2013   Ruben Im, PT 04/19/17 10:57 AM Phone: [phone] Fax: [phone]  Alvera Singh 04/19/2017, 10:56 AM  [facility] [address], [place], [place], [address] Phone: [phone]   Fax:  [phone]  Name: Corey Larson. MRN: [id number] Date of Birth: 08-Oct-1927

## 2017-04-23 ENCOUNTER — Ambulatory Visit: Payer: Medicare Other | Admitting: Physical Therapy

## 2017-04-23 DIAGNOSIS — M545 Low back pain, unspecified: Secondary | ICD-10-CM

## 2017-04-23 DIAGNOSIS — R2681 Unsteadiness on feet: Secondary | ICD-10-CM | POA: Diagnosis not present

## 2017-04-23 DIAGNOSIS — R262 Difficulty in walking, not elsewhere classified: Secondary | ICD-10-CM

## 2017-04-23 DIAGNOSIS — R531 Weakness: Secondary | ICD-10-CM | POA: Diagnosis not present

## 2017-04-23 DIAGNOSIS — G8929 Other chronic pain: Secondary | ICD-10-CM

## 2017-04-23 NOTE — Therapy (Signed)
[facility] [address], [place], [place], [address] Phone: [phone]   Fax:  [phone]  Physical Therapy Treatment  Patient Details  Name: Corey Larson. MRN: [id number] Date of Birth: Jul 06, 1927 Referring Provider: Dr. [name]   Encounter Date: 04/23/2017  PT End of Session - 04/23/17 1153    Visit Number  14    Number of Visits  20    Date for PT Re-Evaluation  04/30/17    Authorization Type  G codes;  KX now because of previous PT    PT Start Time  1148    PT Stop Time  1230    PT Time Calculation (min)  42 min    Activity Tolerance  Patient tolerated treatment well       Past Medical History:  Diagnosis Date  . Abnormality of gait 11/05/2013  . CAD (coronary artery disease) 2005   stent to LAD by Dr. Glade Lloyd. mild irregulatities of RCA.    [name] CRF (chronic renal failure)   . ED (erectile dysfunction)   . H/O exercise stress test 05/2012   no statistically significant ischemia.   [name] History of TIA (transient ischemic attack)    transient confusion  . HOH (hard of hearing)    hearing aids  . Hyperlipidemia LDL goal < 70   . Hypertension   . Insomnia   . Macular degeneration    bilateral  . Macular degeneration    Right>left  . Memory difficulty 04/15/2017  . NSTEMI (non-ST elevated myocardial infarction) ([place]) 04/16/2013  . OSA (obstructive sleep apnea)    mild, never on cpap  . Peripheral neuropathy   . Vitamin D deficiency     Past Surgical History:  Procedure Laterality Date  . ANKLE SURGERY     right ankle 2002 fracture  . BACK SURGERY  1976   laminectomy  . CARDIAC CATHETERIZATION     2005, stent placed DES LAD  . CATARACT EXTRACTION     bilateral  . CORONARY ANGIOPLASTY    . HERNIA REPAIR  1981   "double hernia"  . KNEE SURGERY     right knee 2011   . LEFT HEART CATHETERIZATION WITH CORONARY ANGIOGRAM N/A 04/16/2013   Procedure: LEFT HEART CATHETERIZATION WITH  CORONARY ANGIOGRAM;  Surgeon: Leonie Man, MD;  Location: [facility] CATH LAB;  Service: Cardiovascular;  Laterality: N/A;  . NM MYOCAR MULTIPLE W/SPECT  05/13/2012   MILD UPPER SEPTAL THINNING AND DIAPHRAGMATIC ATTENUATION W/O SIGNIFICANT ISCHEMIA. EF 66%.  . TRANSTHORACIC ECHOCARDIOGRAM  05/12/2010   EF =>55%. MILD CONCENTRIC LVH. TRACE MITRAL REGURG.    There were no vitals filed for this visit.  Subjective Assessment - 04/23/17 1152    Subjective  I didn't want to get up this morning.  Patient states later that he has a sore back from lifting (cleaning up after a water leak).      Pertinent History  neuropathy in both feet painful burning    Currently in Pain?  No/denies    Pain Score  0-No pain                      [facility] Adult PT Treatment/Exercise - 04/23/17 0001      Therapeutic Activites    Other Therapeutic Activities  sit to stand, standing, upright posture      Neuro Re-ed    Neuro Re-ed Details   dynamic and static standing balance  Knee/Hip Exercises: Stretches   Active Hamstring Stretch  Right;Left;5 reps    Active Hamstring Stretch Limitations  on 2nd step    Quad Stretch  Both;5 reps on 2nd step      Knee/Hip Exercises: Aerobic   Nustep  L2 x 10 min Monitored and discussion of progression      Knee/Hip Exercises: Standing   Heel Raises  Both;20 reps UE asst     Forward Step Up  Right;Left;10 reps    Other Standing Knee Exercises  standing on black foam weight shift, staggered stand weight shift, step taps 1 min each; reaching forward     Other Standing Knee Exercises  step and reach forward 5x right/left      Knee/Hip Exercises: Seated   Long Arc Quad Limitations  red band 2x10    Clamshell with TheraBand  Green 20x    Other Seated Knee/Hip Exercises  foam roll push down 10x    Other Seated Knee/Hip Exercises  thoracic extension over foam roll 10x    Sit to [employer]  5 reps;without UE support pause and walk               PT Short Term  Goals - 04/11/17 1124      PT SHORT TERM GOAL #1   Title  Pt will be independent in initial HEP    Status  Partially Met      PT SHORT TERM GOAL #2   Title  Improved bil HS length to 65 degrees and hip flexor lengths to 5 degrees needed for improved step length    Status  Achieved      PT SHORT TERM GOAL #3   Title  Pt will demo improved Berg to 40/56 for decreased falls risk    Status  Achieved        PT Long Term Goals - 03/07/17 1548      PT LONG TERM GOAL #1   Title  Pt will demo improved Berg to >/= 43/56 for decreased falls risk    Time  8    Period  Weeks    Status  On-going      PT LONG TERM GOAL #2   Title  The patient will be independent in safe self progression of HEP for futher improvements in balance    Time  8    Period  Weeks    Status  On-going      PT LONG TERM GOAL #3   Title  Pt will demo improved bil LE strength grossly 4/5 to assist with more stability with gait    Time  8    Period  Weeks    Status  On-going      PT LONG TERM GOAL #4   Title  Pt will demo improved stability with ambulation with RW or least restrictive assistive device level/unlevel surfaces to assist with out of home outings    Time  8    Period  Weeks    Status  On-going      PT LONG TERM GOAL #5   Title  Timed up and Go to 14 sec indicated improved gait velocity and safety for community ambulation    Time  8    Period  Weeks    Status  On-going            Plan - 04/23/17 1154    Clinical Impression Statement  General fatigue and onset of low back pain with standing more than  a few minutes.  He loses focus easily and continues to forget to pause upon Larson rather than taking off walking too quickly and affecting his balance.  Difficulty stabilizing with stepping and reaching.      Rehab Potential  Good    Clinical Impairments Affecting Rehab Potential   peripheral neuropathy    PT Frequency  2x / week    PT Duration  8 weeks    PT Treatment/Interventions  ADLs/Self  Care Home Management;Functional mobility training;Stair training;Gait training;DME Instruction;Therapeutic activities;Therapeutic exercise;Balance training;Neuromuscular re-education;Patient/family education;Manual techniques    PT Next Visit Plan   recheck progress toward goals;  ERO vs. discharge;  continue balance and strength; narrow base of support;  pause after Larson        Patient will benefit from skilled therapeutic intervention in order to improve the following deficits and impairments:  Abnormal gait, Decreased balance, Decreased safety awareness, Decreased strength, Difficulty walking, Impaired flexibility, Postural dysfunction, Pain  Visit Diagnosis: Weakness generalized  Unsteadiness on feet  Difficulty in walking, not elsewhere classified  Chronic bilateral low back pain without sciatica     Problem List Patient Active Problem List   Diagnosis Date Noted  . Memory difficulty 04/15/2017  . Neck pain   . Acute deep vein thrombosis (DVT) of proximal vein of lower extremity (HCC)   . Bilateral pulmonary embolism ([place]) 01/08/2017  . Abnormality of gait 11/05/2013  . OSA (obstructive sleep apnea) 07/23/2013  . Erectile dysfunction 07/23/2013  . CAD (coronary artery disease), Hx of LAD DES in 2005 04/15/2013  . Peripheral neuropathy 04/15/2013  . Hyperlipidemia with target LDL less than 70 04/15/2013   Ruben Im, PT 04/23/17 12:25 PM Phone: [phone] Fax: [phone]  Alvera Singh 04/23/2017, 12:25 PM  [facility] [address], [place], [place], [address] Phone: [phone]   Fax:  [phone]  Name: Dung Salinger. MRN: [id number] Date of Birth: 22-Nov-1927

## 2017-04-30 ENCOUNTER — Encounter: Payer: Self-pay | Admitting: Cardiovascular Disease

## 2017-04-30 ENCOUNTER — Ambulatory Visit: Payer: Medicare Other | Admitting: Physical Therapy

## 2017-04-30 ENCOUNTER — Ambulatory Visit (INDEPENDENT_AMBULATORY_CARE_PROVIDER_SITE_OTHER): Payer: Medicare Other | Admitting: Cardiovascular Disease

## 2017-04-30 VITALS — BP 132/64 | HR 96 | Ht 66.5 in | Wt 152.6 lb

## 2017-04-30 DIAGNOSIS — I10 Essential (primary) hypertension: Secondary | ICD-10-CM

## 2017-04-30 DIAGNOSIS — M545 Low back pain: Secondary | ICD-10-CM | POA: Diagnosis not present

## 2017-04-30 DIAGNOSIS — G8929 Other chronic pain: Secondary | ICD-10-CM

## 2017-04-30 DIAGNOSIS — I2510 Atherosclerotic heart disease of native coronary artery without angina pectoris: Secondary | ICD-10-CM | POA: Diagnosis not present

## 2017-04-30 DIAGNOSIS — Z7901 Long term (current) use of anticoagulants: Secondary | ICD-10-CM | POA: Diagnosis not present

## 2017-04-30 DIAGNOSIS — R531 Weakness: Secondary | ICD-10-CM

## 2017-04-30 DIAGNOSIS — R2681 Unsteadiness on feet: Secondary | ICD-10-CM | POA: Diagnosis not present

## 2017-04-30 DIAGNOSIS — Z86711 Personal history of pulmonary embolism: Secondary | ICD-10-CM

## 2017-04-30 DIAGNOSIS — R262 Difficulty in walking, not elsewhere classified: Secondary | ICD-10-CM

## 2017-04-30 NOTE — Therapy (Signed)
[facility] [address] [place], [place], [address] Phone: [phone]   Fax:  [phone]  Physical Therapy Treatment/Discharge Summary  Patient Details  Name: Corey Larson. MRN: [id number] Date of Birth: 26-May-1928 Referring Provider: Dr. [name]   Encounter Date: 04/30/2017  PT End of Session - 04/30/17 1104    Visit Number  15    Number of Visits  20    Date for PT Re-Evaluation  04/30/17    Authorization Type  G codes;  KX now because of previous PT    PT Start Time  1015    PT Stop Time  1100    PT Time Calculation (min)  45 min    Activity Tolerance  Patient tolerated treatment well       Past Medical History:  Diagnosis Date  . Abnormality of gait 11/05/2013  . CAD (coronary artery disease) 2005   stent to LAD by Dr. Glade Lloyd. mild irregulatities of RCA.    [name] CRF (chronic renal failure)   . ED (erectile dysfunction)   . H/O exercise stress test 05/2012   no statistically significant ischemia.   [name] History of TIA (transient ischemic attack)    transient confusion  . HOH (hard of hearing)    hearing aids  . Hyperlipidemia LDL goal < 70   . Hypertension   . Insomnia   . Macular degeneration    bilateral  . Macular degeneration    Right>left  . Memory difficulty 04/15/2017  . NSTEMI (non-ST elevated myocardial infarction) ([place]) 04/16/2013  . OSA (obstructive sleep apnea)    mild, never on cpap  . Peripheral neuropathy   . Vitamin D deficiency     Past Surgical History:  Procedure Laterality Date  . ANKLE SURGERY     right ankle 2002 fracture  . BACK SURGERY  1976   laminectomy  . CARDIAC CATHETERIZATION     2005, stent placed DES LAD  . CATARACT EXTRACTION     bilateral  . CORONARY ANGIOPLASTY    . HERNIA REPAIR  1981   "double hernia"  . KNEE SURGERY     right knee 2011   . LEFT HEART CATHETERIZATION WITH CORONARY ANGIOGRAM N/A 04/16/2013   Procedure: LEFT HEART  CATHETERIZATION WITH CORONARY ANGIOGRAM;  Surgeon: Leonie Man, MD;  Location: [facility] CATH LAB;  Service: Cardiovascular;  Laterality: N/A;  . NM MYOCAR MULTIPLE W/SPECT  05/13/2012   MILD UPPER SEPTAL THINNING AND DIAPHRAGMATIC ATTENUATION W/O SIGNIFICANT ISCHEMIA. EF 66%.  . TRANSTHORACIC ECHOCARDIOGRAM  05/12/2010   EF =>55%. MILD CONCENTRIC LVH. TRACE MITRAL REGURG.    There were no vitals filed for this visit.  Subjective Assessment - 04/30/17 1020    Subjective  States the dehumidifier at home is affecting him and his "a head full of junk (congestion).  States his balance is improving, "slow but sure."  Last fall was after his trip to the [place] for [place] family [place].  He states his toes catch sometimes on the carpet at home.  Discussed using cane or walker for safety.  I do a home back routine on the floor 3x/week.      How long can you walk comfortably?  "a mile or so."      Currently in Pain?  No/denies    Pain Score  0-No pain         [facility] PT Assessment - 04/30/17 0001      Strength   Right  Hip Flexion  4+/5    Right Hip Extension  4+/5    Right Hip ABduction  4+/5    Left Hip Flexion  4+/5    Left Hip Extension  4+/5    Left Hip ABduction  4+/5    Right Knee Flexion  4+/5    Right Knee Extension  4+/5    Left Knee Flexion  4+/5    Left Knee Extension  4+/5    Right Ankle Dorsiflexion  4+/5    Right Ankle Eversion  4+/5    Left Ankle Dorsiflexion  4+/5    Left Ankle Plantar Flexion  4+/5    Left Ankle Inversion  4+/5      Flexibility   Hamstrings  65 degrees bil    Quadriceps  hip flexors 5 degrees bil      Standardized Balance Assessment   Five times sit to stand comments   24.8 seconds no hands      Berg Balance Test   Sit to Stand  Able to stand without using hands and stabilize independently    Standing Unsupported  Able to stand safely 2 minutes    Sitting with Back Unsupported but Feet Supported on Floor or Stool  Able to sit safely and securely 2  minutes    Stand to Sit  Sits safely with minimal use of hands    Transfers  Able to transfer safely, minor use of hands    Standing Unsupported with Eyes Closed  Able to stand 10 seconds with supervision    Standing Ubsupported with Feet Together  Able to place feet together independently and stand for 1 minute with supervision    From Standing, Reach Forward with Outstretched Arm  Can reach forward >12 cm safely (5")    From Standing Position, Pick up Object from Floor  Able to pick up shoe, needs supervision    From Standing Position, Turn to Look Behind Over each Shoulder  Looks behind one side only/other side shows less weight shift    Turn 360 Degrees  Able to turn 360 degrees safely but slowly    Standing Unsupported, Alternately Place Feet on Step/Stool  Able to complete 4 steps without aid or supervision    Standing Unsupported, One Foot in Front  Able to take small step independently and hold 30 seconds    Standing on One Leg  Tries to lift leg/unable to hold 3 seconds but remains standing independently    Total Score  42    Berg comment:  moderate risk for falls recommended cane and walker      Timed Up and Go Test   TUG  Normal TUG    Normal TUG (seconds)  13.6                  [facility] Adult PT Treatment/Exercise - 04/30/17 0001      Therapeutic Activites    Other Therapeutic Activities  sit to stand, standing, upright posture      Neuro Re-ed    Neuro Re-ed Details   discussion of safe self progression of HEP, gym program, use of assistive device for safety      Knee/Hip Exercises: Stretches   Active Hamstring Stretch  Right;Left;2 reps;30 seconds    Active Hamstring Stretch Limitations  supine with strap      Knee/Hip Exercises: Aerobic   Nustep  L2 x 10 min Monitored and discussion of progression  PT Short Term Goals - May 03, 2017 1113      PT SHORT TERM GOAL #1   Title  Pt will be independent in initial HEP    Status  Achieved       PT SHORT TERM GOAL #2   Title  Improved bil HS length to 65 degrees and hip flexor lengths to 5 degrees needed for improved step length    Status  Achieved      PT SHORT TERM GOAL #3   Title  Pt will demo improved Berg to 40/56 for decreased falls risk    Status  Achieved        PT Long Term Goals - 2017/05/03 1113      PT LONG TERM GOAL #1   Title  Pt will demo improved Berg to >/= 43/56 for decreased falls risk    Status  Partially Met      PT LONG TERM GOAL #2   Title  The patient will be independent in safe self progression of HEP for futher improvements in balance    Status  Achieved      PT LONG TERM GOAL #3   Title  Pt will demo improved bil LE strength grossly 4/5 to assist with more stability with gait    Status  Achieved      PT LONG TERM GOAL #4   Title  Pt will demo improved stability with ambulation with RW or least restrictive assistive device level/unlevel surfaces to assist with out of home outings    Status  Partially Met      PT LONG TERM GOAL #5   Title  Timed up and Go to 14 sec indicated improved gait velocity and safety for community ambulation    Status  Achieved            Plan - 05/03/2017 1105    Clinical Impression Statement  The patient has improved with BERG balance, timed up and go test and 5x sit to stand test since initial evaluation and has also surpassed his scores from previous PT in Spring/early Summer.  His BERG balance score has improved to 42/56 which indicates an 80% risk of future falls.  I have recommended on every visit that he use a rolling walker or at the very least a single point cane.  He has both at home but always states in response, "I'll think about it."  Treatment has focused on balance exercises, functional strengthening and flexibility.  His LE strength is good grossly 4+/5 and he is able to rise from a chair without UE assist.  No changes in objective measurements in the past 3 weeks.   He has partially met rehab goals and  will therefore recommend discharge from PT at this time.  He reports compliance with a home ex program 3x/week.      Clinical Impairments Affecting Rehab Potential   peripheral neuropathy    PT Treatment/Interventions  ADLs/Self Care Home Management;Functional mobility training;Stair training;Gait training;DME Instruction;Therapeutic activities;Therapeutic exercise;Balance training;Neuromuscular re-education;Patient/family education;Manual techniques       Patient will benefit from skilled therapeutic intervention in order to improve the following deficits and impairments:  Abnormal gait, Decreased balance, Decreased safety awareness, Decreased strength, Difficulty walking, Impaired flexibility, Postural dysfunction, Pain  Visit Diagnosis: Weakness generalized  Unsteadiness on feet  Difficulty in walking, not elsewhere classified  Chronic bilateral low back pain without sciatica   G-Codes - May 03, 2017 1114    Functional Assessment Tool Used (Outpatient Only)  TUG, clinical  judgement    Functional Limitation  Mobility: Walking and moving around    Mobility: Walking and Moving Around Goal Status [phone])  At least 20 percent but less than 40 percent impaired, limited or restricted    Mobility: Walking and Moving Around Discharge Status [id number])  At least 20 percent but less than 40 percent impaired, limited or restricted        PHYSICAL THERAPY DISCHARGE SUMMARY  Visits from Start of Care: 15  Current functional level related to goals / functional outcomes: See clinical impressions above   Remaining deficits: As above   Education / Equipment: HEP Plan: Patient agrees to discharge.  Patient goals were partially met. Patient is being discharged due to lack of progress.  ?????        Problem List Patient Active Problem List   Diagnosis Date Noted  . Memory difficulty 04/15/2017  . Neck pain   . Acute deep vein thrombosis (DVT) of proximal vein of lower extremity (HCC)   .  Bilateral pulmonary embolism ([place]) 01/08/2017  . Abnormality of gait 11/05/2013  . OSA (obstructive sleep apnea) 07/23/2013  . Erectile dysfunction 07/23/2013  . CAD (coronary artery disease), Hx of LAD DES in 2005 04/15/2013  . Peripheral neuropathy 04/15/2013  . Hyperlipidemia with target LDL less than 70 04/15/2013   Ruben Im, PT 04/30/17 11:17 AM Phone: [phone] Fax: [phone]  Alvera Singh 04/30/2017, 11:15 AM  [facility] [address], [place], [place], [address] Phone: [phone]   Fax:  [phone]  Name: Corey Larson. MRN: [id number] Date of Birth: Apr 19, 1928

## 2017-04-30 NOTE — Progress Notes (Signed)
Patient ID: Corey Midget., male   DOB: 1927/07/15, 83 y.o.   MRN: [id number]   PCP: Dr. Trilby Drummer  HPI: Corey Streater. is a 83 y.o. male who presents to the office fo a 27 follow-up cardiology evaluation.  Corey Larson has a history of  coronary artery disease and underwent stenting of a 95% focal LAD stenosis by Dr. Glade Lloyd in 2005. At that time he did have mild irregularities of his RCA. He has a history of mild obstructive sleep apnea due to his cardiovascular comorbidities was referred for CPAP titration but he never did get followup with getting a unit. He does have a history of hyperlipidemia, as well as some erectile dysfunction.  Apparently on April 15, 2013 he had been cutting the grass and noticed chest pressure. He ultimately presented to [facility] and ruled in for a NSTEMI. Following day he underwent cardiac catheterization by Dr. Ellyn Hack and was found to have a widely patent LAD stent. There were minimal luminal irregularities in the LAD system. The circumflex vessel is normal. The RCA had an irregular 50-60% lesion the first bend of the vessel. Medical therapy was recommended. He was started on Plavix in addition to isosorbide mononitrate and saw [name] in followup in the office on 04/27/2013. He apparently had been scheduled for a nuclear stress test in December but this was not done.  Corey Larson participated in cardiac rehabilitation. He denies any further episodes of chest pressure. He has been taking Bystolic 5 mg, simvastatin 20 mg in addition to aspirin and Plavix for dual antiplatelet therapy.  He stopped isosorbide in the past and was started on amlodipine so as he could take erectile function medication if necessary .  He had been on dual antiplatelet therapy for 12 years following his stent but Plavix was discontinued 2 years ago.  Since I last saw him, he had been doing well and was not having any recurrent angina symptoms.  This past summer, he  traveled extensively and after a prolonged trip to [place] to witness his great grandchild's  baptism when he returned he apparently had issues and was evaluated by Dr. Maudie Mercury.  He cannot remember exactly what complaints he had.  He was found to have a DVT, as well as pulmonary emboli and was treated with Xarelto.  He received 15 mg twice a day for 3 weeks and since that time has been on 20 mg daily.  He denies chest pain, PND, orthopnea.  He denies dyspnea.  He is lost over 20 pounds over the past year.  He presents for evaluation.  Past Medical History:  Diagnosis Date  . Abnormality of gait 11/05/2013  . CAD (coronary artery disease) 2005   stent to LAD by Dr. Glade Lloyd. mild irregulatities of RCA.    [name] CRF (chronic renal failure)   . ED (erectile dysfunction)   . H/O exercise stress test 05/2012   no statistically significant ischemia.   [name] History of TIA (transient ischemic attack)    transient confusion  . HOH (hard of hearing)    hearing aids  . Hyperlipidemia LDL goal < 70   . Hypertension   . Insomnia   . Macular degeneration    bilateral  . Macular degeneration    Right>left  . Memory difficulty 04/15/2017  . NSTEMI (non-ST elevated myocardial infarction) ([place]) 04/16/2013  . OSA (obstructive sleep apnea)    mild, never on cpap  . Peripheral neuropathy   . Vitamin  D deficiency     Past Surgical History:  Procedure Laterality Date  . ANKLE SURGERY     right ankle 2002 fracture  . BACK SURGERY  1976   laminectomy  . CARDIAC CATHETERIZATION     2005, stent placed DES LAD  . CATARACT EXTRACTION     bilateral  . CORONARY ANGIOPLASTY    . HERNIA REPAIR  1981   "double hernia"  . KNEE SURGERY     right knee 2011   . LEFT HEART CATHETERIZATION WITH CORONARY ANGIOGRAM N/A 04/16/2013   Procedure: LEFT HEART CATHETERIZATION WITH CORONARY ANGIOGRAM;  Surgeon: Leonie Man, MD;  Location: [facility] CATH LAB;  Service: Cardiovascular;  Laterality: N/A;  . NM MYOCAR MULTIPLE W/SPECT   05/13/2012   MILD UPPER SEPTAL THINNING AND DIAPHRAGMATIC ATTENUATION W/O SIGNIFICANT ISCHEMIA. EF 66%.  . TRANSTHORACIC ECHOCARDIOGRAM  05/12/2010   EF =>55%. MILD CONCENTRIC LVH. TRACE MITRAL REGURG.    Allergies  Allergen Reactions  . Klonopin [Clonazepam] Other (See Comments)    Sleep walking episodes  . Ambien [Zolpidem] Other (See Comments)    Sleep walking episodes   . Belsomra [Suvorexant]     Sleep walking    Current Outpatient Medications  Medication Sig Dispense Refill  . B Complex-C (B-COMPLEX WITH VITAMIN C) tablet Take 1 tablet by mouth daily.    . Liniments (BLUE-EMU SUPER STRENGTH) CREA Apply 1 application topically 2 (two) times daily. Use on both feet    . Multiple Vitamins-Minerals (MULTIVITAMIN ADULT PO) Take 1 tablet by mouth daily as needed. Takes One-A-Day for Men    . nitroGLYCERIN (NITROSTAT) 0.4 MG SL tablet Place 1 tablet (0.4 mg total) under the tongue every 5 (five) minutes x 3 doses as needed for chest pain. 25 tablet 12  . NONFORMULARY OR COMPOUNDED ITEM [facility]  Peripheral Neuropathy Cream- Bupivacaine 1%, Doxepin 3%, Gabapentin 6%, Pentoxifylline 3%, Topiramate 1% Apply 1-2 grams to affected area 3-4 times daily Qty. 120 gm 3 refills 1 each 3  . omeprazole (PRILOSEC) 20 MG capsule Take 20 mg by mouth daily.    . ranitidine (ZANTAC) 75 MG tablet Take 75 mg by mouth daily as needed for heartburn.    . Rivaroxaban 15 & 20 MG TBPK Take as directed on package: Start with one '15mg'$  tablet by mouth twice a day with food. On Day 22, switch to one '20mg'$  tablet once a day with food. (Patient taking differently: Take 20 mg by mouth. Take as directed on package: Start with one '15mg'$  tablet by mouth twice a day with food. On Day 22, switch to one '20mg'$  tablet once a day with food.) 51 each 0  . vitamin B-12 (CYANOCOBALAMIN) 1000 MCG tablet Take 1,000 mcg daily by mouth.     No current facility-administered medications for this visit.     Social History    Socioeconomic History  . Marital status: Married    Spouse name: Not on file  . Number of children: 3  . Years of education: Dr of Therapist, sports  . Highest education level: Not on file  Social Needs  . Financial resource strain: Not on file  . Food insecurity - worry: Not on file  . Food insecurity - inability: Not on file  . Transportation needs - medical: Not on file  . Transportation needs - non-medical: Not on file  Occupational History  . Occupation: Retired Designer, television/film set  Tobacco Use  . Smoking status: Former Smoker    Years: 30.00  Types: Cigarettes, Pipe    Last attempt to quit: 04/16/1983    Years since quitting: 34.0  . Smokeless tobacco: Never Used  Substance and Sexual Activity  . Alcohol use: Yes    Comment: 1/2 glass per evening  . Drug use: No  . Sexual activity: Not on file  Other Topics Concern  . Not on file  Social History Narrative   Lives with wife.    Ambulatory without an assistive device, although his wife urges him to use a walker.    Not very active. Quit smoking in 1984.   Caffeine use: Coffee every morning   Sometimes tea   Right handed    Family History  Problem Relation Age of Onset  . Diabetes Sister   . Alcohol abuse Brother   . Cancer Maternal Grandmother   . Heart disease Paternal Grandfather    ROS General: Negative; No fevers, chills, or night sweats;  HEENT: Positive for reduced hearing for which he uses hearing aids; No changes in vision, sinus congestion, difficulty swallowing Pulmonary: Negative; No cough, wheezing, shortness of breath, hemoptysis Cardiovascular: Negative; No chest pain, presyncope, syncope, palpatations GI: Negative; No nausea, vomiting, diarrhea, or abdominal pain GU: Positive for erectile dysfunction No dysuria, hematuria, or difficulty voiding Musculoskeletal: Negative; no myalgias, joint pain, or weakness Hematologic/Oncology: Negative; no easy bruising, bleeding Endocrine: Negative; no heat/cold  intolerance; no diabetes Neuro: Positive for peripheral neuropathy, and gait disturbance headaches Skin: Negative; No rashes or skin lesions Psychiatric: Negative; No behavioral problems, depression Sleep: Positive for mild sleep apnea, but does not use CPAP; no daytime sleepiness, hypersomnolence, bruxism, restless legs, hypnogognic hallucinations, no cataplexy Other comprehensive 14 point system review is negative.   PE BP 132/64   Pulse 96   Ht 5' 6.5" (1.689 m)   Wt 152 lb 9.6 oz (69.2 kg)   BMI 24.26 kg/m    Repeat blood pressure by me was 130/68  Wt Readings from Last 3 Encounters:  04/30/17 152 lb 9.6 oz (69.2 kg)  04/15/17 154 lb 8 oz (70.1 kg)  01/08/17 154 lb 8 oz (70.1 kg)   General: Alert, oriented, no distress.  Skin: normal turgor, no rashes, warm and dry HEENT: Normocephalic, atraumatic. Pupils equal round and reactive to light; sclera anicteric; extraocular muscles intact;  Nose without nasal septal hypertrophy Mouth/Parynx benign; Mallinpatti scale 3 Neck: No JVD, no carotid bruits; normal carotid upstroke Lungs: clear to ausculatation and percussion; no wheezing or rales Chest wall: without tenderness to palpitation Heart: PMI not displaced, RRR, s1 s2 normal, 1/6 systolic murmur, no diastolic murmur, no rubs, gallops, thrills, or heaves Abdomen: soft, nontender; no hepatosplenomehaly, BS+; abdominal aorta nontender and not dilated by palpation. Back: no CVA tenderness Pulses 2+ Musculoskeletal: full range of motion, normal strength, no joint deformities Extremities: no clubbing cyanosis or edema, Homan's sign negative  Neurologic: grossly nonfocal; Cranial nerves grossly wnl Psychologic: Normal mood and affect   ECG (independently read by me): Normal sinus rhythm at 96 bpm.  Borderline first-degree AV block with a PR interval at 204 ms.  QS complex V1 V2.  October 2017 ECG (independently read by me): Normal sinus rhythm at 88 bpm.  Nonspecific ST  changes.  July 2015 ECG (independently read by me): Sinus rhythm at 64 with sinus arrhythmia.  Nonspecific T changes.  QTc interval 437 ms.  Prior February 2015 ECG (independently read by me): Sinus rhythm at 72 beats per minute. No ectopy.  LABS:  BMP Latest Ref Rng &  Units 01/08/2017 01/08/2017 05/02/2015  Glucose 65 - 99 mg/dL 111(H) 116(H) 133(H)  BUN 6 - 20 mg/dL '10 11 20  '$ Creatinine 0.61 - 1.24 mg/dL 1.00 1.13 1.19  Sodium 135 - 145 mmol/L 141 140 139  Potassium 3.5 - 5.1 mmol/L 3.8 4.0 4.1  Chloride 101 - 111 mmol/L 104 104 109  CO2 22 - 32 mmol/L - 24 23  Calcium 8.9 - 10.3 mg/dL - 9.4 9.1   Hepatic Function Latest Ref Rng & Units 01/08/2017 05/02/2015 04/16/2013  Total Protein 6.5 - 8.1 g/dL 8.4(H) 6.9 6.3  Albumin 3.5 - 5.0 g/dL 4.3 3.8 3.3(L)  AST 15 - 41 U/L '24 27 25  '$ ALT 17 - 63 U/L 14(L) 14(L) 12  Alk Phosphatase 38 - 126 U/L 67 47 47  Total Bilirubin 0.3 - 1.2 mg/dL 1.5(H) 1.1 1.3(H)  Bilirubin, Direct 0.0 - 0.3 mg/dL - - 0.2   CBC Latest Ref Rng & Units 01/11/2017 01/10/2017 01/09/2017  WBC 4.0 - 10.5 K/uL 8.6 8.9 7.7  Hemoglobin 13.0 - 17.0 g/dL 13.4 14.0 13.4  Hematocrit 39.0 - 52.0 % 39.9 42.1 39.8  Platelets 150 - 400 K/uL 183 157 136(L)   Lab Results  Component Value Date   MCV 82.6 01/11/2017   MCV 82.1 01/10/2017   MCV 81.2 01/09/2017   Lab Results  Component Value Date   TSH 1.012 04/15/2013   Lab Results  Component Value Date   HGBA1C 6.0 (H) 04/15/2013   Lipid Panel     Component Value Date/Time   CHOL 105 04/16/2013 0503   TRIG 65 04/16/2013 0503   HDL 42 04/16/2013 0503   CHOLHDL 2.5 04/16/2013 0503   VLDL 13 04/16/2013 0503   LDLCALC 50 04/16/2013 0503   No results found.  IMPRESSION:  1. CAD in native artery   2. Essential hypertension   3. History of pulmonary embolism   4. Anticoagulation adequate     ASSESSMENT AND PLAN: Corey Larson is an 83 year old gentleman who is 13 years status post PCI to a high-grade 95% focal LAD  stenosis in 2005.  In November 2014 he suffered a small non-ST segment elevation MI and the culprit lesion was felt to be 50-60% which was not significantly obstructive and at that time medical therapy was recommended.  He had been on dual antiplatelet therapy, but 2 years ago.  His Plavix was discontinued.  He has a history of hypertension, and in the past he had been on Bystolic as well as amlodipine, but it does not appear that he is on these medications presently.  He developed an extensive deep vein thrombosis in the right common femoral vein, femoral vein, profundofemoral vein, popliteal vein, posterior tibial veins and peroneal veins.  Following a plane trip to [place] and was diagnosed as having pulmonary emboli for which she has been on anticoagulation with Xarelto.  He has lost 20 pounds since I last saw him.  His blood pressure today is upper normal.  Based on new guidelines and repeat by me was 130/68.  He is not having any anginal symptoms.  His ECG remained stable, but his pulse is increased when compared to previously when he was on Bystolic.  He is now seeing Dr. Maudie Mercury since Dr. Alyson Ingles has been out on medical leave.  At present, he is well compensated with reference to his volume status.  He is not having significantly bleeding on Xarelto.  It appears he may have some memory issues, but I am  not certain if this has been evaluated.  He will be turning 83 years old next month.  As long as he remains stable I will see him in one-year for reevaluation.  Time spent: 25 minutes  Corey Sine, MD, [facility]  04/30/2017 7:27 PM

## 2017-04-30 NOTE — Patient Instructions (Signed)
Medication Instructions: Your physician recommends that you continue on your current medications as directed. Please refer to the Current Medication list given to you today.  If you need a refill on your cardiac medications before your next appointment, please call your pharmacy.    Follow-Up: Your physician wants you to follow-up in: 12 months with Dr. Claiborne Billings. You will receive a reminder letter in the mail two months in advance. If you don't receive a letter, please call our office at [phone] to schedule this follow-up appointment.   Thank you for choosing [facility] at [facility]!!

## 2017-05-17 DIAGNOSIS — M1711 Unilateral primary osteoarthritis, right knee: Secondary | ICD-10-CM | POA: Diagnosis not present

## 2017-05-29 DIAGNOSIS — H353221 Exudative age-related macular degeneration, left eye, with active choroidal neovascularization: Secondary | ICD-10-CM | POA: Diagnosis not present

## 2017-05-31 DIAGNOSIS — H353221 Exudative age-related macular degeneration, left eye, with active choroidal neovascularization: Secondary | ICD-10-CM | POA: Diagnosis not present

## 2017-06-03 DIAGNOSIS — H6123 Impacted cerumen, bilateral: Secondary | ICD-10-CM | POA: Diagnosis not present

## 2017-06-06 DIAGNOSIS — L570 Actinic keratosis: Secondary | ICD-10-CM | POA: Diagnosis not present

## 2017-06-06 DIAGNOSIS — L821 Other seborrheic keratosis: Secondary | ICD-10-CM | POA: Diagnosis not present

## 2017-06-06 DIAGNOSIS — L84 Corns and callosities: Secondary | ICD-10-CM | POA: Diagnosis not present

## 2017-06-06 DIAGNOSIS — D692 Other nonthrombocytopenic purpura: Secondary | ICD-10-CM | POA: Diagnosis not present

## 2017-06-06 DIAGNOSIS — D485 Neoplasm of uncertain behavior of skin: Secondary | ICD-10-CM | POA: Diagnosis not present

## 2017-06-06 DIAGNOSIS — Z85828 Personal history of other malignant neoplasm of skin: Secondary | ICD-10-CM | POA: Diagnosis not present

## 2017-06-06 DIAGNOSIS — D1801 Hemangioma of skin and subcutaneous tissue: Secondary | ICD-10-CM | POA: Diagnosis not present

## 2017-06-06 DIAGNOSIS — L989 Disorder of the skin and subcutaneous tissue, unspecified: Secondary | ICD-10-CM | POA: Diagnosis not present

## 2017-06-27 DIAGNOSIS — D485 Neoplasm of uncertain behavior of skin: Secondary | ICD-10-CM | POA: Diagnosis not present

## 2017-06-27 DIAGNOSIS — L570 Actinic keratosis: Secondary | ICD-10-CM | POA: Diagnosis not present

## 2017-06-27 DIAGNOSIS — L988 Other specified disorders of the skin and subcutaneous tissue: Secondary | ICD-10-CM | POA: Diagnosis not present

## 2017-06-27 DIAGNOSIS — Z85828 Personal history of other malignant neoplasm of skin: Secondary | ICD-10-CM | POA: Diagnosis not present

## 2017-07-03 DIAGNOSIS — I129 Hypertensive chronic kidney disease with stage 1 through stage 4 chronic kidney disease, or unspecified chronic kidney disease: Secondary | ICD-10-CM | POA: Diagnosis not present

## 2017-07-03 DIAGNOSIS — E785 Hyperlipidemia, unspecified: Secondary | ICD-10-CM | POA: Diagnosis not present

## 2017-07-03 DIAGNOSIS — N183 Chronic kidney disease, stage 3 (moderate): Secondary | ICD-10-CM | POA: Diagnosis not present

## 2017-07-03 DIAGNOSIS — E559 Vitamin D deficiency, unspecified: Secondary | ICD-10-CM | POA: Diagnosis not present

## 2017-07-03 DIAGNOSIS — M109 Gout, unspecified: Secondary | ICD-10-CM | POA: Diagnosis not present

## 2017-07-08 ENCOUNTER — Ambulatory Visit (INDEPENDENT_AMBULATORY_CARE_PROVIDER_SITE_OTHER): Payer: Medicare Other | Admitting: Podiatry

## 2017-07-08 ENCOUNTER — Encounter: Payer: Self-pay | Admitting: Podiatry

## 2017-07-08 DIAGNOSIS — L989 Disorder of the skin and subcutaneous tissue, unspecified: Secondary | ICD-10-CM | POA: Diagnosis not present

## 2017-07-10 DIAGNOSIS — E7800 Pure hypercholesterolemia, unspecified: Secondary | ICD-10-CM | POA: Diagnosis not present

## 2017-07-10 DIAGNOSIS — I10 Essential (primary) hypertension: Secondary | ICD-10-CM | POA: Diagnosis not present

## 2017-07-10 DIAGNOSIS — E559 Vitamin D deficiency, unspecified: Secondary | ICD-10-CM | POA: Diagnosis not present

## 2017-07-10 DIAGNOSIS — G4700 Insomnia, unspecified: Secondary | ICD-10-CM | POA: Diagnosis not present

## 2017-07-10 NOTE — Progress Notes (Signed)
   Subjective: 84 year old male presents to the office today for follow up evaluation of a painful callus lesion of the right sub 5th MPJ. Patient states that the pain is ongoing and is affecting their ability to ambulate without pain. Patient presents today for further treatment and evaluation.   Past Medical History:  Diagnosis Date  . Abnormality of gait 11/05/2013  . CAD (coronary artery disease) 2005   stent to LAD by Dr. Glade Lloyd. mild irregulatities of RCA.    [name] CRF (chronic renal failure)   . ED (erectile dysfunction)   . H/O exercise stress test 05/2012   no statistically significant ischemia.   [name] History of TIA (transient ischemic attack)    transient confusion  . HOH (hard of hearing)    hearing aids  . Hyperlipidemia LDL goal < 70   . Hypertension   . Insomnia   . Macular degeneration    bilateral  . Macular degeneration    Right>left  . Memory difficulty 04/15/2017  . NSTEMI (non-ST elevated myocardial infarction) ([place]) 04/16/2013  . OSA (obstructive sleep apnea)    mild, never on cpap  . Peripheral neuropathy   . Vitamin D deficiency      Objective:  Physical Exam General: Alert and oriented x3 in no acute distress  Dermatology: Hyperkeratotic lesion present on the plantar aspect of the fifth MPJ right foot. Pain on palpation with a central nucleated core noted.  Skin is warm, dry and supple bilateral lower extremities. Negative for open lesions or macerations.  Vascular: Palpable pedal pulses bilaterally. No edema or erythema noted. Capillary refill within normal limits.  Neurological: Epicritic and protective threshold grossly intact bilaterally.   Musculoskeletal Exam: Pain on palpation at the keratotic lesion noted. Range of motion within normal limits bilateral. Muscle strength 5/5 in all groups bilateral.  Assessment: #1 porokeratosis sub-fifth MPJ right foot #2 history of ORIF right ankle   Plan of Care:  #1 Patient evaluated #2 Excisional  debridement of keratoic lesion using a chisel blade was performed without incident.  #3 Treated area(s) with Salinocaine and dressed with light dressing. #4 Recommended Urea 40% lotion. #5 Patient is to return to the clinic PRN.   Edrick Kins, DPM [facility]  Dr. Edrick Kins, [place]                                        [place], [place] [address]                Office [phone]  Fax [phone]

## 2017-07-11 DIAGNOSIS — D485 Neoplasm of uncertain behavior of skin: Secondary | ICD-10-CM | POA: Diagnosis not present

## 2017-07-11 DIAGNOSIS — Z85828 Personal history of other malignant neoplasm of skin: Secondary | ICD-10-CM | POA: Diagnosis not present

## 2017-07-11 DIAGNOSIS — C4442 Squamous cell carcinoma of skin of scalp and neck: Secondary | ICD-10-CM | POA: Diagnosis not present

## 2017-07-11 DIAGNOSIS — D044 Carcinoma in situ of skin of scalp and neck: Secondary | ICD-10-CM | POA: Diagnosis not present

## 2017-07-17 ENCOUNTER — Ambulatory Visit
Admission: RE | Admit: 2017-07-17 | Discharge: 2017-07-17 | Disposition: A | Discharge status: Home / Self Care | Payer: Medicare Other | Source: Ambulatory Visit | Attending: Internal Medicine | Admitting: Internal Medicine

## 2017-07-17 ENCOUNTER — Other Ambulatory Visit: Payer: Self-pay | Admitting: Internal Medicine

## 2017-07-17 DIAGNOSIS — M79662 Pain in left lower leg: Secondary | ICD-10-CM

## 2017-07-24 DIAGNOSIS — Z0000 Encounter for general adult medical examination without abnormal findings: Secondary | ICD-10-CM | POA: Diagnosis not present

## 2017-07-24 DIAGNOSIS — G4700 Insomnia, unspecified: Secondary | ICD-10-CM | POA: Diagnosis not present

## 2017-07-24 DIAGNOSIS — M5431 Sciatica, right side: Secondary | ICD-10-CM | POA: Diagnosis not present

## 2017-07-24 DIAGNOSIS — M79604 Pain in right leg: Secondary | ICD-10-CM | POA: Diagnosis not present

## 2017-07-25 DIAGNOSIS — H353221 Exudative age-related macular degeneration, left eye, with active choroidal neovascularization: Secondary | ICD-10-CM | POA: Diagnosis not present

## 2017-07-30 DIAGNOSIS — Z85828 Personal history of other malignant neoplasm of skin: Secondary | ICD-10-CM | POA: Diagnosis not present

## 2017-07-30 DIAGNOSIS — C4442 Squamous cell carcinoma of skin of scalp and neck: Secondary | ICD-10-CM | POA: Diagnosis not present

## 2017-08-05 DIAGNOSIS — M48061 Spinal stenosis, lumbar region without neurogenic claudication: Secondary | ICD-10-CM | POA: Diagnosis not present

## 2017-08-05 DIAGNOSIS — G609 Hereditary and idiopathic neuropathy, unspecified: Secondary | ICD-10-CM | POA: Diagnosis not present

## 2017-08-21 DIAGNOSIS — J069 Acute upper respiratory infection, unspecified: Secondary | ICD-10-CM | POA: Diagnosis not present

## 2017-08-21 DIAGNOSIS — G629 Polyneuropathy, unspecified: Secondary | ICD-10-CM | POA: Diagnosis not present

## 2017-08-26 DIAGNOSIS — M545 Low back pain: Secondary | ICD-10-CM | POA: Diagnosis not present

## 2017-08-30 ENCOUNTER — Emergency Department (HOSPITAL_BASED_OUTPATIENT_CLINIC_OR_DEPARTMENT_OTHER)
Admission: EM | Admit: 2017-08-30 | Discharge: 2017-08-30 | Disposition: A | Discharge status: Home / Self Care | Payer: Medicare Other | Attending: Emergency Medicine | Admitting: Emergency Medicine

## 2017-08-30 ENCOUNTER — Other Ambulatory Visit: Payer: Self-pay

## 2017-08-30 ENCOUNTER — Emergency Department (HOSPITAL_BASED_OUTPATIENT_CLINIC_OR_DEPARTMENT_OTHER): Payer: Medicare Other

## 2017-08-30 ENCOUNTER — Encounter (HOSPITAL_BASED_OUTPATIENT_CLINIC_OR_DEPARTMENT_OTHER): Payer: Self-pay | Admitting: *Deleted

## 2017-08-30 DIAGNOSIS — Z87891 Personal history of nicotine dependence: Secondary | ICD-10-CM | POA: Diagnosis not present

## 2017-08-30 DIAGNOSIS — I252 Old myocardial infarction: Secondary | ICD-10-CM | POA: Insufficient documentation

## 2017-08-30 DIAGNOSIS — Y999 Unspecified external cause status: Secondary | ICD-10-CM | POA: Insufficient documentation

## 2017-08-30 DIAGNOSIS — W0110XA Fall on same level from slipping, tripping and stumbling with subsequent striking against unspecified object, initial encounter: Secondary | ICD-10-CM | POA: Insufficient documentation

## 2017-08-30 DIAGNOSIS — Y9301 Activity, walking, marching and hiking: Secondary | ICD-10-CM | POA: Insufficient documentation

## 2017-08-30 DIAGNOSIS — S0083XA Contusion of other part of head, initial encounter: Secondary | ICD-10-CM | POA: Insufficient documentation

## 2017-08-30 DIAGNOSIS — S0990XA Unspecified injury of head, initial encounter: Secondary | ICD-10-CM | POA: Diagnosis not present

## 2017-08-30 DIAGNOSIS — N189 Chronic kidney disease, unspecified: Secondary | ICD-10-CM | POA: Diagnosis not present

## 2017-08-30 DIAGNOSIS — R51 Headache: Secondary | ICD-10-CM | POA: Diagnosis not present

## 2017-08-30 DIAGNOSIS — I2510 Atherosclerotic heart disease of native coronary artery without angina pectoris: Secondary | ICD-10-CM | POA: Insufficient documentation

## 2017-08-30 DIAGNOSIS — Y929 Unspecified place or not applicable: Secondary | ICD-10-CM | POA: Diagnosis not present

## 2017-08-30 DIAGNOSIS — I129 Hypertensive chronic kidney disease with stage 1 through stage 4 chronic kidney disease, or unspecified chronic kidney disease: Secondary | ICD-10-CM | POA: Diagnosis not present

## 2017-08-30 DIAGNOSIS — W19XXXA Unspecified fall, initial encounter: Secondary | ICD-10-CM

## 2017-08-30 DIAGNOSIS — S199XXA Unspecified injury of neck, initial encounter: Secondary | ICD-10-CM | POA: Diagnosis not present

## 2017-08-30 LAB — BASIC METABOLIC PANEL
Anion gap: 9 (ref 5–15)
BUN: 21 mg/dL — ABNORMAL HIGH (ref 6–20)
CO2: 20 mmol/L — ABNORMAL LOW (ref 22–32)
Calcium: 8.8 mg/dL — ABNORMAL LOW (ref 8.9–10.3)
Chloride: 109 mmol/L (ref 101–111)
Creatinine, Ser: 0.92 mg/dL (ref 0.61–1.24)
GFR calc Af Amer: >60 mL/min (ref >60)
GFR calc non Af Amer: >60 mL/min (ref >60)
Glucose, Bld: 100 mg/dL — ABNORMAL HIGH (ref 65–99)
Potassium: 3.9 mmol/L (ref 3.5–5.1)
Sodium: 138 mmol/L (ref 135–145)

## 2017-08-30 LAB — CBC WITH DIFFERENTIAL/PLATELET
Basophils Absolute: 0 10*3/uL (ref 0.0–0.1)
Basophils Relative: 0 %
Eosinophils Absolute: 0.1 10*3/uL (ref 0.0–0.7)
Eosinophils Relative: 2 %
HCT: 39.8 % (ref 39.0–52.0)
Hemoglobin: 13.1 g/dL (ref 13.0–17.0)
Lymphocytes Relative: 31 %
Lymphs Abs: 1.8 10*3/uL (ref 0.7–4.0)
MCH: 27.6 pg (ref 26.0–34.0)
MCHC: 32.9 g/dL (ref 30.0–36.0)
MCV: 83.8 fL (ref 78.0–100.0)
Monocytes Absolute: 0.7 10*3/uL (ref 0.1–1.0)
Monocytes Relative: 11 %
Neutro Abs: 3.2 10*3/uL (ref 1.7–7.7)
Neutrophils Relative %: 56 %
Platelets: 196 10*3/uL (ref 150–400)
RBC: 4.75 MIL/uL (ref 4.22–5.81)
RDW: 14.7 % (ref 11.5–15.5)
WBC: 5.7 10*3/uL (ref 4.0–10.5)

## 2017-08-30 NOTE — ED Notes (Signed)
NAD at this time. Pt is stable and going home.  

## 2017-08-30 NOTE — ED Provider Notes (Signed)
[facility] EMERGENCY DEPARTMENT Provider Note   CSN: [id number] Arrival date & time: 08/30/17  1512     History   Chief Complaint Chief Complaint  Patient presents with  . Fall    HPI Corey Larson. is a 84 y.o. male with history of CAD, PE anticoagulated on Xarelto, macular degeneration, hypertension who presents following fall that occurred 5 days ago.  Patient reports he was walking out of the doctor's office when he tripped and fell on the sidewalk.  He fell on his face.  He did not lose consciousness.  He denies nausea or vomiting.  He has a abrasion with hematoma over the left side of his forehead, which is the only place he hurts. He noticed over the last few days that he has had bruising around both his eyes that he did not have before.  He denies any neck or back pain.  He denies any chest pain, shortness of breath, abdominal pain, numbness or tingling, or weakness.  He denies any lightheadedness or dizziness prior to falling.  No interventions prior to arrival.  HPI  Past Medical History:  Diagnosis Date  . Abnormality of gait 11/05/2013  . CAD (coronary artery disease) 2005   stent to LAD by Dr. Glade Lloyd. mild irregulatities of RCA.    [name] CRF (chronic renal failure)   . ED (erectile dysfunction)   . H/O exercise stress test 05/2012   no statistically significant ischemia.   [name] History of TIA (transient ischemic attack)    transient confusion  . HOH (hard of hearing)    hearing aids  . Hyperlipidemia LDL goal < 70   . Hypertension   . Insomnia   . Macular degeneration    bilateral  . Macular degeneration    Right>left  . Memory difficulty 04/15/2017  . NSTEMI (non-ST elevated myocardial infarction) ([place]) 04/16/2013  . OSA (obstructive sleep apnea)    mild, never on cpap  . Peripheral neuropathy   . Vitamin D deficiency     Patient Active Problem List   Diagnosis Date Noted  . Memory difficulty 04/15/2017  . Neck pain   . Acute deep vein  thrombosis (DVT) of proximal vein of lower extremity (HCC)   . Bilateral pulmonary embolism ([place]) 01/08/2017  . Abnormality of gait 11/05/2013  . OSA (obstructive sleep apnea) 07/23/2013  . Erectile dysfunction 07/23/2013  . CAD (coronary artery disease), Hx of LAD DES in 2005 04/15/2013  . Peripheral neuropathy 04/15/2013  . Hyperlipidemia with target LDL less than 70 04/15/2013    Past Surgical History:  Procedure Laterality Date  . ANKLE SURGERY     right ankle 2002 fracture  . BACK SURGERY  1976   laminectomy  . CARDIAC CATHETERIZATION     2005, stent placed DES LAD  . CATARACT EXTRACTION     bilateral  . CORONARY ANGIOPLASTY    . HERNIA REPAIR  1981   "double hernia"  . KNEE SURGERY     right knee 2011   . LEFT HEART CATHETERIZATION WITH CORONARY ANGIOGRAM N/A 04/16/2013   Procedure: LEFT HEART CATHETERIZATION WITH CORONARY ANGIOGRAM;  Surgeon: Leonie Man, MD;  Location: [facility] CATH LAB;  Service: Cardiovascular;  Laterality: N/A;  . NM MYOCAR MULTIPLE W/SPECT  05/13/2012   MILD UPPER SEPTAL THINNING AND DIAPHRAGMATIC ATTENUATION W/O SIGNIFICANT ISCHEMIA. EF 66%.  . TRANSTHORACIC ECHOCARDIOGRAM  05/12/2010   EF =>55%. MILD CONCENTRIC LVH. TRACE MITRAL REGURG.        Home  Medications    Prior to Admission medications   Medication Sig Start Date End Date Taking? Authorizing Provider  B Complex-C (B-COMPLEX WITH VITAMIN C) tablet Take 1 tablet by mouth daily.   Yes [provider]  Multiple Vitamins-Minerals (MULTIVITAMIN ADULT PO) Take 1 tablet by mouth daily as needed. Takes One-A-Day for Men   Yes [provider]  omeprazole (PRILOSEC) 20 MG capsule Take 20 mg by mouth daily.   Yes [provider]  ranitidine (ZANTAC) 75 MG tablet Take 75 mg by mouth daily as needed for heartburn.   Yes [provider]  vitamin B-12 (CYANOCOBALAMIN) 1000 MCG tablet Take 1,000 mcg daily by mouth.   Yes [provider]  Liniments (BLUE-EMU  SUPER STRENGTH) CREA Apply 1 application topically 2 (two) times daily. Use on both feet    [provider]  nitroGLYCERIN (NITROSTAT) 0.4 MG SL tablet Place 1 tablet (0.4 mg total) under the tongue every 5 (five) minutes x 3 doses as needed for chest pain. 04/26/14   Troy Sine, MD  NONFORMULARY OR COMPOUNDED ITEM [facility]  Peripheral Neuropathy Cream- Bupivacaine 1%, Doxepin 3%, Gabapentin 6%, Pentoxifylline 3%, Topiramate 1% Apply 1-2 grams to affected area 3-4 times daily Qty. 120 gm 3 refills 09/07/16   Edrick Kins, DPM  Rivaroxaban 15 & 20 MG TBPK Take as directed on package: Start with one 15mg  tablet by mouth twice a day with food. On Day 22, switch to one 20mg  tablet once a day with food. Patient taking differently: Take 20 mg by mouth. Take as directed on package: Start with one 15mg  tablet by mouth twice a day with food. On Day 22, switch to one 20mg  tablet once a day with food. 01/11/17   Elgergawy, Silver Huguenin, MD    Family History Family History  Problem Relation Age of Onset  . Diabetes Sister   . Alcohol abuse Brother   . Cancer Maternal Grandmother   . Heart disease Paternal Grandfather     Social History Social History   Tobacco Use  . Smoking status: Former Smoker    Years: 30.00    Types: Cigarettes, Pipe    Last attempt to quit: 04/16/1983    Years since quitting: 34.3  . Smokeless tobacco: Never Used  Substance Use Topics  . Alcohol use: Yes    Comment: 1/2 glass per evening  . Drug use: No     Allergies   Klonopin [clonazepam]; Ambien [zolpidem]; and Belsomra [suvorexant]   Review of Systems Review of Systems  Constitutional: Negative for chills and fever.  HENT: Negative for facial swelling and sore throat.   Respiratory: Negative for shortness of breath.   Cardiovascular: Negative for chest pain.  Gastrointestinal: Negative for abdominal pain, nausea and vomiting.  Genitourinary: Negative for dysuria.  Musculoskeletal:  Negative for back pain and neck pain.  Skin: Positive for color change and wound. Negative for rash.  Neurological: Negative for syncope and headaches.  Psychiatric/Behavioral: The patient is not nervous/anxious.      Physical Exam Updated Vital Signs BP 134/86 (BP Location: Right Arm)   Pulse 68   Temp 98.4 F (36.9 C) (Oral)   Resp 18   Ht 5' 6.5" (1.689 m)   Wt 68 kg (150 lb)   SpO2 100%   BMI 23.85 kg/m   Physical Exam  Constitutional: He appears well-developed and well-nourished. No distress.  HENT:  Head: Normocephalic. Head is with raccoon's eyes and with contusion. Head is without  Battle's sign.    Mouth/Throat: Oropharynx is clear and moist. No oropharyngeal exudate.  Eyes: Pupils are equal, round, and reactive to light. EOM are normal. Right eye exhibits no discharge. Left eye exhibits no discharge. Right conjunctiva has a hemorrhage (scant around 7:00 on the sclera). Left conjunctiva has a hemorrhage (lateral aspect of sclera). No scleral icterus.  Neck: Normal range of motion. Neck supple. No thyromegaly present.  Cardiovascular: Normal rate, regular rhythm, normal heart sounds and intact distal pulses. Exam reveals no gallop and no friction rub.  No murmur heard. Pulmonary/Chest: Effort normal and breath sounds normal. No stridor. No respiratory distress. He has no wheezes. He has no rales. He exhibits no tenderness.  No ecchymosis noted  Abdominal: Soft. Bowel sounds are normal. He exhibits no distension. There is no tenderness. There is no rebound and no guarding.  No ecchymosis noted  Musculoskeletal: He exhibits no edema.  No midline cervical, thoracic, or lumbar tenderness No tenderness to palpation to all joints  Lymphadenopathy:    He has no cervical adenopathy.  Neurological: He is alert. Coordination normal.  CN 3-12 intact; normal sensation throughout; 5/5 strength in all 4 extremities; equal bilateral grip strength; no ataxia on finger to nose  Skin:  Skin is warm and dry. No rash noted. He is not diaphoretic. No pallor.  Psychiatric: He has a normal mood and affect.  Nursing note and vitals reviewed.    ED Treatments / Results  Labs (all labs ordered are listed, but only abnormal results are displayed) Labs Reviewed  BASIC METABOLIC PANEL - Abnormal; Notable for the following components:      Result Value   CO2 20 (*)    Glucose, Bld 100 (*)    BUN 21 (*)    Calcium 8.8 (*)    All other components within normal limits  CBC WITH DIFFERENTIAL/PLATELET    EKG None  Radiology Ct Head Wo Contrast  Result Date: 08/30/2017 CLINICAL DATA:  Fall 4 days ago. On blood thinners. Headache. Initial encounter. EXAM: CT HEAD WITHOUT CONTRAST CT CERVICAL SPINE WITHOUT CONTRAST TECHNIQUE: Multidetector CT imaging of the head and cervical spine was performed following the standard protocol without intravenous contrast. Multiplanar CT image reconstructions of the cervical spine were also generated. COMPARISON:  Cervical spine radiographs 01/10/2017. Brain MRI 11/12/2013. FINDINGS: CT HEAD FINDINGS Brain: There is no evidence of acute infarct, intracranial hemorrhage, mass, midline shift, or extra-axial fluid collection. Generalized cerebral atrophy is similar to the prior MRI. Periventricular white matter hypodensities are stable to mildly progressed from the T2 signal abnormality on the prior MRI and are nonspecific but compatible with mild chronic small vessel ischemic disease for age. Vascular: Calcified atherosclerosis at the skull base. No hyperdense vessel. Skull: No fracture or focal osseous lesion. Sinuses/Orbits: Mild bilateral ethmoid air cell mucosal thickening. Clear mastoid air cells. Bilateral cataract extraction. Other: Small left forehead hematoma. CT CERVICAL SPINE FINDINGS Alignment: Trace retrolisthesis of C3 on C4, C5 on C6, and C6 on C7, degenerative in appearance and unchanged. Skull base and vertebrae: No acute fracture or suspicious  osseous lesion. Soft tissues and spinal canal: No prevertebral fluid or swelling. No visible canal hematoma. Disc levels: Advanced diffuse cervical disc degeneration with severe disc space narrowing from C2-3 to C6-7. Mild multilevel spinal stenosis. Moderate multilevel neural foraminal stenosis. Moderate to severe facet arthrosis on the right at C2-3 and on the left at C4-5. Upper chest: Clear lung apices. Other: None. IMPRESSION: 1. No evidence of acute intracranial  abnormality. 2. Mild chronic small vessel ischemic disease. 3. Small left forehead scalp hematoma. 4. No acute cervical spine fracture. 5. diffuse cervical disc degeneration. Electronically Signed   By: Logan Bores M.D.   On: 08/30/2017 16:14   Ct Cervical Spine Wo Contrast  Result Date: 08/30/2017 CLINICAL DATA:  Fall 4 days ago. On blood thinners. Headache. Initial encounter. EXAM: CT HEAD WITHOUT CONTRAST CT CERVICAL SPINE WITHOUT CONTRAST TECHNIQUE: Multidetector CT imaging of the head and cervical spine was performed following the standard protocol without intravenous contrast. Multiplanar CT image reconstructions of the cervical spine were also generated. COMPARISON:  Cervical spine radiographs 01/10/2017. Brain MRI 11/12/2013. FINDINGS: CT HEAD FINDINGS Brain: There is no evidence of acute infarct, intracranial hemorrhage, mass, midline shift, or extra-axial fluid collection. Generalized cerebral atrophy is similar to the prior MRI. Periventricular white matter hypodensities are stable to mildly progressed from the T2 signal abnormality on the prior MRI and are nonspecific but compatible with mild chronic small vessel ischemic disease for age. Vascular: Calcified atherosclerosis at the skull base. No hyperdense vessel. Skull: No fracture or focal osseous lesion. Sinuses/Orbits: Mild bilateral ethmoid air cell mucosal thickening. Clear mastoid air cells. Bilateral cataract extraction. Other: Small left forehead hematoma. CT CERVICAL SPINE  FINDINGS Alignment: Trace retrolisthesis of C3 on C4, C5 on C6, and C6 on C7, degenerative in appearance and unchanged. Skull base and vertebrae: No acute fracture or suspicious osseous lesion. Soft tissues and spinal canal: No prevertebral fluid or swelling. No visible canal hematoma. Disc levels: Advanced diffuse cervical disc degeneration with severe disc space narrowing from C2-3 to C6-7. Mild multilevel spinal stenosis. Moderate multilevel neural foraminal stenosis. Moderate to severe facet arthrosis on the right at C2-3 and on the left at C4-5. Upper chest: Clear lung apices. Other: None. IMPRESSION: 1. No evidence of acute intracranial abnormality. 2. Mild chronic small vessel ischemic disease. 3. Small left forehead scalp hematoma. 4. No acute cervical spine fracture. 5. diffuse cervical disc degeneration. Electronically Signed   By: Logan Bores M.D.   On: 08/30/2017 16:14    Procedures Procedures (including critical care time)  Medications Ordered in ED Medications - No data to display   Initial Impression / Assessment and Plan / ED Course  I have reviewed the triage vital signs and the nursing notes.  Pertinent labs & imaging results that were available during my care of the patient were reviewed by me and considered in my medical decision making (see chart for details).  Clinical Course as of Aug 31 1702  Fri Aug 31, 3615  1568 84 year old male on blood thinners here after a fall a few days ago striking his forehead.  He is concerned now that he is got some bruising around his eyes.  On exam he still has a frontal hematoma and now he is got bilateral raccoon eyes.  There is no real facial tenderness.  He had a head CT that was negative for intracranial hemorrhage.  I think this is just dependent blood that has come down from the hematoma.  I reviewed to watch out for any signs of infection and he will return if any problems.   [MB]    Clinical Course User Index [MB] Hayden Rasmussen, MD    Patient with fall 5 days ago.  CT head and C-spine are negative for intracranial hemorrhage or other acute findings.  There is a small hematoma over his left eyebrow.  No laceration requiring repair.  Supportive treatment discussed including  ice.  CBC, BMP within normal limits.  Follow-up to PCP in 3-4 days for recheck.  Return precautions discussed.  Patient and wife understand and agree with plan.  Patient vitals stable throughout ED course and discharged in satisfactory condition.  Patient also evaluated by Dr. Melina Copa who guided the patient's management and agrees with plan.  Final Clinical Impressions(s) / ED Diagnoses   Final diagnoses:  Fall, initial encounter  Contusion of face, initial encounter    ED Discharge Orders    None       Frederica Kuster, PA-C 08/30/17 1705    Hayden Rasmussen, MD 08/31/17 [phone]

## 2017-08-30 NOTE — ED Triage Notes (Signed)
[place] fell on his face and left side. Pt is on blood thinners. Both eyes are blue and noticeable laceration to left forehead.

## 2017-08-30 NOTE — Discharge Instructions (Signed)
Use ice to your head 3-4 times daily alternating 15 minutes on, 15 minutes off.  Please follow-up with your doctor in 3-4 days for recheck.  Please return to the emergency department if you develop any new or worsening symptoms.

## 2017-09-04 ENCOUNTER — Ambulatory Visit (INDEPENDENT_AMBULATORY_CARE_PROVIDER_SITE_OTHER): Payer: Medicare Other | Admitting: Podiatry

## 2017-09-04 ENCOUNTER — Encounter: Payer: Self-pay | Admitting: Podiatry

## 2017-09-04 DIAGNOSIS — Q828 Other specified congenital malformations of skin: Secondary | ICD-10-CM | POA: Diagnosis not present

## 2017-09-05 DIAGNOSIS — E538 Deficiency of other specified B group vitamins: Secondary | ICD-10-CM | POA: Diagnosis not present

## 2017-09-05 DIAGNOSIS — E559 Vitamin D deficiency, unspecified: Secondary | ICD-10-CM | POA: Diagnosis not present

## 2017-09-05 DIAGNOSIS — Z7901 Long term (current) use of anticoagulants: Secondary | ICD-10-CM | POA: Diagnosis not present

## 2017-09-05 DIAGNOSIS — G4700 Insomnia, unspecified: Secondary | ICD-10-CM | POA: Diagnosis not present

## 2017-09-06 NOTE — Progress Notes (Signed)
   Subjective: 84 year old male presents to the office today for follow up evaluation of a painful callus lesion of the right sub 5th MPJ. He states that the pain is ongoing and is affecting his ability to ambulate without pain. He has not done anything for treatment since his last visit. Walking and bearing weight increases the pain. Patient presents today for further treatment and evaluation.   Past Medical History:  Diagnosis Date  . Abnormality of gait 11/05/2013  . CAD (coronary artery disease) 2005   stent to LAD by Dr. Glade Lloyd. mild irregulatities of RCA.    [name] CRF (chronic renal failure)   . ED (erectile dysfunction)   . H/O exercise stress test 05/2012   no statistically significant ischemia.   [name] History of TIA (transient ischemic attack)    transient confusion  . HOH (hard of hearing)    hearing aids  . Hyperlipidemia LDL goal < 70   . Hypertension   . Insomnia   . Macular degeneration    bilateral  . Macular degeneration    Right>left  . Memory difficulty 04/15/2017  . NSTEMI (non-ST elevated myocardial infarction) ([place]) 04/16/2013  . OSA (obstructive sleep apnea)    mild, never on cpap  . Peripheral neuropathy   . Vitamin D deficiency      Objective:  Physical Exam General: Alert and oriented x3 in no acute distress  Dermatology: Hyperkeratotic lesion present on the plantar aspect of the fifth MPJ right foot. Pain on palpation with a central nucleated core noted.  Skin is warm, dry and supple bilateral lower extremities. Negative for open lesions or macerations.  Vascular: Palpable pedal pulses bilaterally. No edema or erythema noted. Capillary refill within normal limits.  Neurological: Epicritic and protective threshold grossly intact bilaterally.   Musculoskeletal Exam: Pain on palpation at the keratotic lesion noted. Range of motion within normal limits bilateral. Muscle strength 5/5 in all groups bilateral.  Assessment: #1 porokeratosis sub-fifth MPJ  right foot #2 history of ORIF right ankle   Plan of Care:  #1 Patient evaluated. #2 Excisional debridement of keratoic lesion using a chisel blade was performed without incident. Light dressing applied. #3 Discussed possible surgery in the future. He will discuss with his wife. #4 Return to clinic in 6 weeks.   Edrick Kins, DPM [facility]  Dr. Edrick Kins, [place]                                        [place], [place] [address]                Office [phone]  Fax [phone]

## 2017-09-16 ENCOUNTER — Ambulatory Visit: Payer: Medicare Other | Admitting: Podiatry

## 2017-09-19 DIAGNOSIS — H353221 Exudative age-related macular degeneration, left eye, with active choroidal neovascularization: Secondary | ICD-10-CM | POA: Diagnosis not present

## 2017-10-03 DIAGNOSIS — G4700 Insomnia, unspecified: Secondary | ICD-10-CM | POA: Diagnosis not present

## 2017-10-03 DIAGNOSIS — E559 Vitamin D deficiency, unspecified: Secondary | ICD-10-CM | POA: Diagnosis not present

## 2017-10-03 DIAGNOSIS — E538 Deficiency of other specified B group vitamins: Secondary | ICD-10-CM | POA: Diagnosis not present

## 2017-10-16 ENCOUNTER — Ambulatory Visit (INDEPENDENT_AMBULATORY_CARE_PROVIDER_SITE_OTHER): Payer: Medicare Other | Admitting: Podiatry

## 2017-10-16 DIAGNOSIS — Q828 Other specified congenital malformations of skin: Secondary | ICD-10-CM

## 2017-10-17 ENCOUNTER — Other Ambulatory Visit: Payer: Self-pay

## 2017-10-17 ENCOUNTER — Ambulatory Visit (INDEPENDENT_AMBULATORY_CARE_PROVIDER_SITE_OTHER): Payer: Medicare Other | Admitting: Neurology

## 2017-10-17 ENCOUNTER — Encounter: Payer: Self-pay | Admitting: Neurology

## 2017-10-17 VITALS — BP 107/59 | HR 72 | Ht 66.5 in | Wt 155.5 lb

## 2017-10-17 DIAGNOSIS — E538 Deficiency of other specified B group vitamins: Secondary | ICD-10-CM

## 2017-10-17 DIAGNOSIS — R413 Other amnesia: Secondary | ICD-10-CM

## 2017-10-17 DIAGNOSIS — R269 Unspecified abnormalities of gait and mobility: Secondary | ICD-10-CM

## 2017-10-17 DIAGNOSIS — G603 Idiopathic progressive neuropathy: Secondary | ICD-10-CM | POA: Diagnosis not present

## 2017-10-17 MED ORDER — DONEPEZIL HCL 5 MG PO TABS: 5.0000 mg | ORAL_TABLET | Freq: Every day | ORAL | 0 refills | Status: AC

## 2017-10-17 NOTE — Progress Notes (Signed)
Reason for visit: Memory disturbance  Corey Larson. is an 84 y.o. male  History of present illness:  Corey Larson is an 84 year old right-handed white male with a history of a memory disturbance.  The patient was found to have a B12 deficiency on his last visit, he has been on vitamin B12 supplementation.  He has been off of Aricept, he does drive a car occasionally, but he does have difficulty with directions.  He has not gotten back to playing the bills and doing the finances, his wife has taken over this.  He has his days and nights mixed up, he tends to stay awake at night and sleep during the day.  He has a peripheral neuropathy, he has gait instability associated with this, he fell on 30 August 2017, he went to the emergency room.  He has recovered fairly well from this.  The patient will take gabapentin at nighttime to help him sleep, but this may make him drowsy during the day and more staggery.  He has been able to maintain his weight.  He returns for an evaluation.  Past Medical History:  Diagnosis Date  . Abnormality of gait 11/05/2013  . CAD (coronary artery disease) 2005   stent to LAD by Dr. Glade Lloyd. mild irregulatities of RCA.    [name] CRF (chronic renal failure)   . ED (erectile dysfunction)   . H/O exercise stress test 05/2012   no statistically significant ischemia.   [name] History of TIA (transient ischemic attack)    transient confusion  . HOH (hard of hearing)    hearing aids  . Hyperlipidemia LDL goal < 70   . Hypertension   . Insomnia   . Macular degeneration    bilateral  . Macular degeneration    Right>left  . Memory difficulty 04/15/2017  . NSTEMI (non-ST elevated myocardial infarction) ([place]) 04/16/2013  . OSA (obstructive sleep apnea)    mild, never on cpap  . Peripheral neuropathy   . Vitamin D deficiency     Past Surgical History:  Procedure Laterality Date  . ANKLE SURGERY     right ankle 2002 fracture  . BACK SURGERY  1976   laminectomy  .  CARDIAC CATHETERIZATION     2005, stent placed DES LAD  . CATARACT EXTRACTION     bilateral  . CORONARY ANGIOPLASTY    . HERNIA REPAIR  1981   "double hernia"  . KNEE SURGERY     right knee 2011   . LEFT HEART CATHETERIZATION WITH CORONARY ANGIOGRAM N/A 04/16/2013   Procedure: LEFT HEART CATHETERIZATION WITH CORONARY ANGIOGRAM;  Surgeon: Leonie Man, MD;  Location: [facility] CATH LAB;  Service: Cardiovascular;  Laterality: N/A;  . NM MYOCAR MULTIPLE W/SPECT  05/13/2012   MILD UPPER SEPTAL THINNING AND DIAPHRAGMATIC ATTENUATION W/O SIGNIFICANT ISCHEMIA. EF 66%.  . TRANSTHORACIC ECHOCARDIOGRAM  05/12/2010   EF =>55%. MILD CONCENTRIC LVH. TRACE MITRAL REGURG.    Family History  Problem Relation Age of Onset  . Diabetes Sister   . Alcohol abuse Brother   . Cancer Maternal Grandmother   . Heart disease Paternal Grandfather     Social history:  reports that he quit smoking about 34 years ago. His smoking use included cigarettes and pipe. He quit after 30.00 years of use. He has never used smokeless tobacco. He reports that he drinks alcohol. He reports that he does not use drugs.    Allergies  Allergen Reactions  . Klonopin [Clonazepam] Other (See  Comments)    Sleep walking episodes  . Ambien [Zolpidem] Other (See Comments)    Sleep walking episodes   . Belsomra [Suvorexant]     Sleep walking    Medications:  Prior to Admission medications   Medication Sig Start Date End Date Taking? Authorizing Provider  B Complex-C (B-COMPLEX WITH VITAMIN C) tablet Take 1 tablet by mouth daily.   Yes [provider]  gabapentin (NEURONTIN) 800 MG tablet Take 800 mg by mouth at bedtime. 10/15/17  Yes [provider]  Liniments (BLUE-EMU SUPER STRENGTH) CREA Apply 1 application topically 2 (two) times daily. Use on both feet   Yes [provider]  Multiple Vitamins-Minerals (MULTIVITAMIN ADULT PO) Take 1 tablet by mouth daily as needed. Takes One-A-Day for Men   Yes  [provider]  nitroGLYCERIN (NITROSTAT) 0.4 MG SL tablet Place 1 tablet (0.4 mg total) under the tongue every 5 (five) minutes x 3 doses as needed for chest pain. 04/26/14  Yes Troy Sine, MD  NONFORMULARY OR COMPOUNDED ITEM [facility]  Peripheral Neuropathy Cream- Bupivacaine 1%, Doxepin 3%, Gabapentin 6%, Pentoxifylline 3%, Topiramate 1% Apply 1-2 grams to affected area 3-4 times daily Qty. 120 gm 3 refills 09/07/16  Yes Edrick Kins, DPM  omeprazole (PRILOSEC) 20 MG capsule Take 20 mg by mouth daily.   Yes [provider]  ranitidine (ZANTAC) 75 MG tablet Take 75 mg by mouth daily as needed for heartburn.   Yes [provider]  rivaroxaban (XARELTO) 20 MG TABS tablet Take 20 mg by mouth daily with supper.   Yes [provider]  vitamin B-12 (CYANOCOBALAMIN) 1000 MCG tablet Take 1,000 mcg daily by mouth.   Yes [provider]    ROS:  Out of a complete 14 system review of symptoms, the patient complains only of the following symptoms, and all other reviewed systems are negative.  Memory disturbance Gait instability  Blood pressure (!) 107/59, pulse 72, height 5' 6.5" (1.689 m), weight 155 lb 8 oz (70.5 kg).  Physical Exam  General: The patient is alert and cooperative at the time of the examination.  Skin: No significant peripheral edema is noted.   Neurologic Exam  Mental status: The patient is alert and oriented x 3 at the time of the examination. The patient has apparent normal recent and remote memory, with an apparently normal attention span and concentration ability.  Mini-Mental status examination done today shows a total score 27/30.   Cranial nerves: Facial symmetry is present. Speech is normal, no aphasia or dysarthria is noted. Extraocular movements are full. Visual fields are full.  Motor: The patient has good strength in all 4 extremities.  Sensory examination: Soft touch sensation is symmetric on the  face, arms, and legs.  Coordination: The patient has good finger-nose-finger and heel-to-shin bilaterally.  Gait and station: The patient has a normal gait. Tandem gait is unsteady. Romberg is negative. No drift is seen.  Reflexes: Deep tendon reflexes are symmetric, but are depressed.   Assessment/Plan:  1.  Memory disturbance  2.  Peripheral neuropathy  3.  Gait disturbance  4.  Vitamin B12 deficiency  The patient will have blood work done again today to check the B12 level, if this is normal, he will continue on the oral supplementation.  If not, he will require IM injections.  The patient will go back on Aricept taking 5 mg at night.  He will follow-up in 6 months.  Jill Alexanders MD 10/17/2017  3:43 PM  [facility] [address] [place], [place] [address]  Phone [phone] Fax [phone]

## 2017-10-17 NOTE — Patient Instructions (Signed)
Restart the aricept 5 mg at night.

## 2017-10-18 LAB — VITAMIN B12: Vitamin B-12: 847 pg/mL (ref 232–1245)

## 2017-10-21 NOTE — Progress Notes (Signed)
   Subjective: 84 year old male presents to the office today for follow up evaluation of a painful callus lesion of the right sub 5th MPJ. He reports continued pain from the area. He has not done anything at home for treatment. Walking increases the pain. Patient is here for further evaluation and treatment.   Past Medical History:  Diagnosis Date  . Abnormality of gait 11/05/2013  . CAD (coronary artery disease) 2005   stent to LAD by Dr. Glade Lloyd. mild irregulatities of RCA.    [name] CRF (chronic renal failure)   . ED (erectile dysfunction)   . H/O exercise stress test 05/2012   no statistically significant ischemia.   [name] History of TIA (transient ischemic attack)    transient confusion  . HOH (hard of hearing)    hearing aids  . Hyperlipidemia LDL goal < 70   . Hypertension   . Insomnia   . Macular degeneration    bilateral  . Macular degeneration    Right>left  . Memory difficulty 04/15/2017  . NSTEMI (non-ST elevated myocardial infarction) ([place]) 04/16/2013  . OSA (obstructive sleep apnea)    mild, never on cpap  . Peripheral neuropathy   . Vitamin D deficiency      Objective:  Physical Exam General: Alert and oriented x3 in no acute distress  Dermatology: Hyperkeratotic lesion present on the plantar aspect of the fifth MPJ right foot. Pain on palpation with a central nucleated core noted.  Skin is warm, dry and supple bilateral lower extremities. Negative for open lesions or macerations.  Vascular: Palpable pedal pulses bilaterally. No edema or erythema noted. Capillary refill within normal limits.  Neurological: Epicritic and protective threshold grossly intact bilaterally.   Musculoskeletal Exam: Pain on palpation at the keratotic lesion noted. Range of motion within normal limits bilateral. Muscle strength 5/5 in all groups bilateral.  Assessment: #1 porokeratosis sub-fifth MPJ right foot   Plan of Care:  #1 Patient evaluated. #2 Excisional debridement of keratoic  lesion using a chisel blade was performed without incident. Salinocaine applied and dressed with light dressing. #3 Recommended OTC Dr. Felicie Morn corn and callus remover.  #4 Return to clinic in 2 months.    Edrick Kins, DPM [facility]  Dr. Edrick Kins, [place]                                        [place] [facility] [place], [place] [address]                Office [phone]  Fax [phone]

## 2017-10-29 DIAGNOSIS — D485 Neoplasm of uncertain behavior of skin: Secondary | ICD-10-CM | POA: Diagnosis not present

## 2017-10-29 DIAGNOSIS — L821 Other seborrheic keratosis: Secondary | ICD-10-CM | POA: Diagnosis not present

## 2017-10-29 DIAGNOSIS — L570 Actinic keratosis: Secondary | ICD-10-CM | POA: Diagnosis not present

## 2017-10-29 DIAGNOSIS — Z85828 Personal history of other malignant neoplasm of skin: Secondary | ICD-10-CM | POA: Diagnosis not present

## 2017-11-07 DIAGNOSIS — H353221 Exudative age-related macular degeneration, left eye, with active choroidal neovascularization: Secondary | ICD-10-CM | POA: Diagnosis not present

## 2017-11-25 ENCOUNTER — Telehealth: Payer: Self-pay | Admitting: Neurology

## 2017-11-25 NOTE — Telephone Encounter (Signed)
I called the patient and talk with the wife.  The patient has had problems with sleepwalking on Ambien and Belsomra in the past.  On the Ambien, he got up and got dressed, went downstairs and got in the car, but did not drive.  His wife found him because he set off the alarm coming out side.  The patient may try switching the Aricept to the morning, the patient may be having vivid dreams and may have a REM sleep behavior disorder.  If he still has ongoing problems, she will contact our office.

## 2017-11-25 NOTE — Telephone Encounter (Signed)
Please advise 

## 2017-11-25 NOTE — Telephone Encounter (Signed)
Pts wife Romie Minus called stating that since the pt started taking donepezil (ARICEPT) 5 MG tablet las Monday night 6/17 he has been waking up in the middle of the night and wandering off. Romie Minus states the police have shown up due to the house alarm going off from the pt walking out and going to the car and ect. Please call to advise.

## 2017-12-16 ENCOUNTER — Encounter: Payer: Self-pay | Admitting: Podiatry

## 2017-12-16 ENCOUNTER — Ambulatory Visit (INDEPENDENT_AMBULATORY_CARE_PROVIDER_SITE_OTHER): Payer: Medicare Other | Admitting: Podiatry

## 2017-12-16 DIAGNOSIS — L989 Disorder of the skin and subcutaneous tissue, unspecified: Secondary | ICD-10-CM

## 2017-12-17 DIAGNOSIS — H353221 Exudative age-related macular degeneration, left eye, with active choroidal neovascularization: Secondary | ICD-10-CM | POA: Diagnosis not present

## 2017-12-23 NOTE — Progress Notes (Signed)
   Subjective: 84 year old male presents to the office today for follow up evaluation of a painful callus lesion of the right sub 5th MPJ. He reports continued pain from the area. He has not done anything at home for treatment. Walking and bearing weight increases the pain. Patient is here for further evaluation and treatment.   Past Medical History:  Diagnosis Date  . Abnormality of gait 11/05/2013  . CAD (coronary artery disease) 2005   stent to LAD by Dr. Glade Lloyd. mild irregulatities of RCA.    [name] CRF (chronic renal failure)   . ED (erectile dysfunction)   . H/O exercise stress test 05/2012   no statistically significant ischemia.   [name] History of TIA (transient ischemic attack)    transient confusion  . HOH (hard of hearing)    hearing aids  . Hyperlipidemia LDL goal < 70   . Hypertension   . Insomnia   . Macular degeneration    bilateral  . Macular degeneration    Right>left  . Memory difficulty 04/15/2017  . NSTEMI (non-ST elevated myocardial infarction) ([place]) 04/16/2013  . OSA (obstructive sleep apnea)    mild, never on cpap  . Peripheral neuropathy   . Vitamin D deficiency      Objective:  Physical Exam General: Alert and oriented x3 in no acute distress  Dermatology: Hyperkeratotic lesion present on the sub-fifth MPJ right foot. Pain on palpation with a central nucleated core noted.  Skin is warm, dry and supple bilateral lower extremities. Negative for open lesions or macerations.  Vascular: Palpable pedal pulses bilaterally. No edema or erythema noted. Capillary refill within normal limits.  Neurological: Epicritic and protective threshold grossly intact bilaterally.   Musculoskeletal Exam: Pain on palpation at the keratotic lesion noted. Range of motion within normal limits bilateral. Muscle strength 5/5 in all groups bilateral.  Assessment: #1 porokeratosis sub-fifth MPJ right foot   Plan of Care:  #1 Patient evaluated. #2 Excisional debridement of  keratoic lesion using a chisel blade was performed without incident. Dressed with light dressing. #3 Recommended good shoe gear.  #4 Return to clinic as needed.    Edrick Kins, DPM [facility]  Dr. Edrick Kins, [place]                                        [place], [place] [address]                Office [phone]  Fax [phone]

## 2018-01-02 DIAGNOSIS — Z125 Encounter for screening for malignant neoplasm of prostate: Secondary | ICD-10-CM | POA: Diagnosis not present

## 2018-01-02 DIAGNOSIS — Z0000 Encounter for general adult medical examination without abnormal findings: Secondary | ICD-10-CM | POA: Diagnosis not present

## 2018-01-02 DIAGNOSIS — I129 Hypertensive chronic kidney disease with stage 1 through stage 4 chronic kidney disease, or unspecified chronic kidney disease: Secondary | ICD-10-CM | POA: Diagnosis not present

## 2018-01-14 DIAGNOSIS — H353221 Exudative age-related macular degeneration, left eye, with active choroidal neovascularization: Secondary | ICD-10-CM | POA: Diagnosis not present

## 2018-01-30 DIAGNOSIS — D1801 Hemangioma of skin and subcutaneous tissue: Secondary | ICD-10-CM | POA: Diagnosis not present

## 2018-01-30 DIAGNOSIS — L570 Actinic keratosis: Secondary | ICD-10-CM | POA: Diagnosis not present

## 2018-01-30 DIAGNOSIS — L821 Other seborrheic keratosis: Secondary | ICD-10-CM | POA: Diagnosis not present

## 2018-01-30 DIAGNOSIS — D692 Other nonthrombocytopenic purpura: Secondary | ICD-10-CM | POA: Diagnosis not present

## 2018-01-30 DIAGNOSIS — Z85828 Personal history of other malignant neoplasm of skin: Secondary | ICD-10-CM | POA: Diagnosis not present

## 2018-02-17 ENCOUNTER — Encounter: Payer: Self-pay | Admitting: Sports Medicine

## 2018-02-17 ENCOUNTER — Ambulatory Visit (INDEPENDENT_AMBULATORY_CARE_PROVIDER_SITE_OTHER): Payer: Medicare Other | Admitting: Sports Medicine

## 2018-02-17 DIAGNOSIS — Q828 Other specified congenital malformations of skin: Secondary | ICD-10-CM

## 2018-02-17 DIAGNOSIS — D689 Coagulation defect, unspecified: Secondary | ICD-10-CM

## 2018-02-17 DIAGNOSIS — M79604 Pain in right leg: Secondary | ICD-10-CM

## 2018-02-17 DIAGNOSIS — L989 Disorder of the skin and subcutaneous tissue, unspecified: Secondary | ICD-10-CM

## 2018-02-17 NOTE — Progress Notes (Signed)
Subjective: Corey Larson. is a 84 y.o. male patient who presents to office for evaluation of Right foot pain secondary to callus skin. Patient reports that he comes to see Dr. Amalia Hailey once a month for his callus trim and every now and again for his nail trim.  Patient reports that he wants his callus to be trimmed today because it is very tender on the right foot which causes him difficulty when he is walking and standing long periods of time.  Patient denies any changes with medical history and admits that he is on blood thinner but denies a history of diabetes.  No other issues noted.  Patient Active Problem List   Diagnosis Date Noted  . Memory difficulty 04/15/2017  . Neck pain   . Acute deep vein thrombosis (DVT) of proximal vein of lower extremity (HCC)   . Bilateral pulmonary embolism ([place]) 01/08/2017  . Abnormality of gait 11/05/2013  . OSA (obstructive sleep apnea) 07/23/2013  . Erectile dysfunction 07/23/2013  . CAD (coronary artery disease), Hx of LAD DES in 2005 04/15/2013  . Peripheral neuropathy 04/15/2013  . Hyperlipidemia with target LDL less than 70 04/15/2013    Current Outpatient Medications on File Prior to Visit  Medication Sig Dispense Refill  . B Complex-C (B-COMPLEX WITH VITAMIN C) tablet Take 1 tablet by mouth daily.    [name] donepezil (ARICEPT) 5 MG tablet Take 1 tablet (5 mg total) by mouth at bedtime. 30 tablet 0  . gabapentin (NEURONTIN) 800 MG tablet Take 800 mg by mouth at bedtime.    . Liniments (BLUE-EMU SUPER STRENGTH) CREA Apply 1 application topically 2 (two) times daily. Use on both feet    . Multiple Vitamins-Minerals (MULTIVITAMIN ADULT PO) Take 1 tablet by mouth daily as needed. Takes One-A-Day for Men    . nitroGLYCERIN (NITROSTAT) 0.4 MG SL tablet Place 1 tablet (0.4 mg total) under the tongue every 5 (five) minutes x 3 doses as needed for chest pain. 25 tablet 12  . NONFORMULARY OR COMPOUNDED ITEM [facility]  Peripheral Neuropathy  Cream- Bupivacaine 1%, Doxepin 3%, Gabapentin 6%, Pentoxifylline 3%, Topiramate 1% Apply 1-2 grams to affected area 3-4 times daily Qty. 120 gm 3 refills 1 each 3  . omeprazole (PRILOSEC) 20 MG capsule Take 20 mg by mouth daily.    . ranitidine (ZANTAC) 75 MG tablet Take 75 mg by mouth daily as needed for heartburn.    . rivaroxaban (XARELTO) 20 MG TABS tablet Take 20 mg by mouth daily with supper.    . vitamin B-12 (CYANOCOBALAMIN) 1000 MCG tablet Take 1,000 mcg daily by mouth.     No current facility-administered medications on file prior to visit.     Allergies  Allergen Reactions  . Klonopin [Clonazepam] Other (See Comments)    Sleep walking episodes  . Ambien [Zolpidem] Other (See Comments)    Sleep walking episodes   . Belsomra [Suvorexant]     Sleep walking  . Zolpidem Tartrate     Objective:  General: Alert and oriented x3 in no acute distress  Dermatology: Keratotic lesion present sub-met 5 on right with skin lines transversing the lesion, pain is present with direct pressure to the lesion with a central nucleated core noted, no webspace macerations, no ecchymosis bilateral, all nails x 10 mildly elongated and thickened consistent with onychomycosis.  Vascular: Dorsalis Pedis and Posterior Tibial pedal pulses 1/4, Capillary Fill Time 3 seconds, + scant pedal hair growth bilateral, no edema bilateral lower extremities, Temperature  gradient within normal limits.  Neurology: Johney Maine sensation intact via light touch bilateral.  Musculoskeletal: Mild tenderness with palpation at the keratotic lesion site on Right foot, muscular strength 5/5 in all groups without pain or limitation on range of motion.  Prominent fifth metatarsal head right greater than left with fat pad atrophy.  Assessment and Plan: Problem List Items Addressed This Visit    None    Visit Diagnoses    Benign skin lesion    -  Primary   Porokeratosis       Pain of right lower extremity       Coagulopathy  (HCC)         -Complete examination performed -Discussed treatment options -Parred keratoic lesion using a chisel blade; treated the area withSalinocaine covered with moleskin -Encouraged daily skin emollients -Encouraged use of pumice stone -Advised good supportive shoes and to consider over-the-counter versus custom inserts -Patient to return to office in 1 month for routine foot care with Dr. Amalia Hailey or sooner if condition worsens.  Landis Martins, DPM

## 2018-02-19 DIAGNOSIS — Z961 Presence of intraocular lens: Secondary | ICD-10-CM | POA: Diagnosis not present

## 2018-02-19 DIAGNOSIS — Z23 Encounter for immunization: Secondary | ICD-10-CM | POA: Diagnosis not present

## 2018-02-19 DIAGNOSIS — H353221 Exudative age-related macular degeneration, left eye, with active choroidal neovascularization: Secondary | ICD-10-CM | POA: Diagnosis not present

## 2018-02-19 DIAGNOSIS — H353114 Nonexudative age-related macular degeneration, right eye, advanced atrophic with subfoveal involvement: Secondary | ICD-10-CM | POA: Diagnosis not present

## 2018-02-19 DIAGNOSIS — H903 Sensorineural hearing loss, bilateral: Secondary | ICD-10-CM | POA: Diagnosis not present

## 2018-02-19 DIAGNOSIS — H52202 Unspecified astigmatism, left eye: Secondary | ICD-10-CM | POA: Diagnosis not present

## 2018-02-21 DIAGNOSIS — M25512 Pain in left shoulder: Secondary | ICD-10-CM | POA: Diagnosis not present

## 2018-02-21 DIAGNOSIS — M25561 Pain in right knee: Secondary | ICD-10-CM | POA: Diagnosis not present

## 2018-02-26 DIAGNOSIS — H353221 Exudative age-related macular degeneration, left eye, with active choroidal neovascularization: Secondary | ICD-10-CM | POA: Diagnosis not present

## 2018-03-17 ENCOUNTER — Ambulatory Visit (INDEPENDENT_AMBULATORY_CARE_PROVIDER_SITE_OTHER): Payer: Medicare Other | Admitting: Podiatry

## 2018-03-17 DIAGNOSIS — L989 Disorder of the skin and subcutaneous tissue, unspecified: Secondary | ICD-10-CM

## 2018-03-18 NOTE — Progress Notes (Signed)
   Subjective: 84 year old male presenting today for follow up evaluation of a painful callus lesion noted to the right foot. He had the area trimmed one month ago and believes it needs to be done again. He has not done anything at home for treatment. Walking and standing for long periods of time increases the pain. He is currently on anticoagulant therapy. Patient is here for further evaluation and treatment.   Past Medical History:  Diagnosis Date  . Abnormality of gait 11/05/2013  . CAD (coronary artery disease) 2005   stent to LAD by Dr. Glade Lloyd. mild irregulatities of RCA.    [name] CRF (chronic renal failure)   . ED (erectile dysfunction)   . H/O exercise stress test 05/2012   no statistically significant ischemia.   [name] History of TIA (transient ischemic attack)    transient confusion  . HOH (hard of hearing)    hearing aids  . Hyperlipidemia LDL goal < 70   . Hypertension   . Insomnia   . Macular degeneration    bilateral  . Macular degeneration    Right>left  . Memory difficulty 04/15/2017  . NSTEMI (non-ST elevated myocardial infarction) ([place]) 04/16/2013  . OSA (obstructive sleep apnea)    mild, never on cpap  . Peripheral neuropathy   . Vitamin D deficiency      Objective:  Physical Exam General: Alert and oriented x3 in no acute distress  Dermatology: Hyperkeratotic lesion present on the right sub-fifth MPJ. Pain on palpation with a central nucleated core noted. Skin is warm, dry and supple bilateral lower extremities. Negative for open lesions or macerations.  Vascular: Palpable pedal pulses bilaterally. No edema or erythema noted. Capillary refill within normal limits.  Neurological: Epicritic and protective threshold grossly intact bilaterally.   Musculoskeletal Exam: Pain on palpation at the keratotic lesion noted. Range of motion within normal limits bilateral. Muscle strength 5/5 in all groups bilateral.  Assessment: 1. Pre-ulcerative callus lesion noted to  the right sub-fifth MPJ   Plan of Care:  1. Patient evaluated 2. Excisional debridement of keratoic lesion using a chisel blade was performed without incident.  3. Dressed area with light dressing. 4. Recommended good shoe gear.  5. Patient is to return to the clinic in 4 weeks.   Edrick Kins, DPM [facility]  Dr. Edrick Kins, [place]                                        [place], [place] [address]                Office [phone]  Fax [phone]

## 2018-04-07 DIAGNOSIS — R350 Frequency of micturition: Secondary | ICD-10-CM | POA: Diagnosis not present

## 2018-04-07 DIAGNOSIS — G609 Hereditary and idiopathic neuropathy, unspecified: Secondary | ICD-10-CM | POA: Diagnosis not present

## 2018-04-07 DIAGNOSIS — I82409 Acute embolism and thrombosis of unspecified deep veins of unspecified lower extremity: Secondary | ICD-10-CM | POA: Diagnosis not present

## 2018-04-07 DIAGNOSIS — Z86711 Personal history of pulmonary embolism: Secondary | ICD-10-CM | POA: Diagnosis not present

## 2018-04-07 DIAGNOSIS — R413 Other amnesia: Secondary | ICD-10-CM | POA: Diagnosis not present

## 2018-04-07 DIAGNOSIS — R918 Other nonspecific abnormal finding of lung field: Secondary | ICD-10-CM | POA: Diagnosis not present

## 2018-04-08 ENCOUNTER — Other Ambulatory Visit: Payer: Self-pay | Admitting: Family Medicine

## 2018-04-08 ENCOUNTER — Other Ambulatory Visit: Payer: Medicare Other

## 2018-04-08 DIAGNOSIS — I824Y9 Acute embolism and thrombosis of unspecified deep veins of unspecified proximal lower extremity: Secondary | ICD-10-CM

## 2018-04-08 DIAGNOSIS — Z86711 Personal history of pulmonary embolism: Secondary | ICD-10-CM

## 2018-04-08 DIAGNOSIS — M79605 Pain in left leg: Principal | ICD-10-CM

## 2018-04-08 DIAGNOSIS — M79604 Pain in right leg: Secondary | ICD-10-CM

## 2018-04-09 ENCOUNTER — Ambulatory Visit
Admission: RE | Admit: 2018-04-09 | Discharge: 2018-04-09 | Disposition: A | Discharge status: Home / Self Care | Payer: Medicare Other | Source: Ambulatory Visit | Attending: Family Medicine | Admitting: Family Medicine

## 2018-04-09 DIAGNOSIS — M79661 Pain in right lower leg: Secondary | ICD-10-CM | POA: Diagnosis not present

## 2018-04-09 DIAGNOSIS — R918 Other nonspecific abnormal finding of lung field: Secondary | ICD-10-CM | POA: Diagnosis not present

## 2018-04-09 DIAGNOSIS — M79662 Pain in left lower leg: Secondary | ICD-10-CM | POA: Diagnosis not present

## 2018-04-09 DIAGNOSIS — Z86711 Personal history of pulmonary embolism: Secondary | ICD-10-CM

## 2018-04-09 DIAGNOSIS — M79604 Pain in right leg: Secondary | ICD-10-CM

## 2018-04-09 DIAGNOSIS — M79605 Pain in left leg: Principal | ICD-10-CM

## 2018-04-09 MED ORDER — IOPAMIDOL (ISOVUE-370) INJECTION 76%
75.0000 mL | Freq: Once | INTRAVENOUS | Status: AC | PRN
  Administered 2018-04-09: 75 mL via INTRAVENOUS

## 2018-04-16 DIAGNOSIS — H353221 Exudative age-related macular degeneration, left eye, with active choroidal neovascularization: Secondary | ICD-10-CM | POA: Diagnosis not present

## 2018-04-16 DIAGNOSIS — H353211 Exudative age-related macular degeneration, right eye, with active choroidal neovascularization: Secondary | ICD-10-CM | POA: Diagnosis not present

## 2018-04-21 DIAGNOSIS — G629 Polyneuropathy, unspecified: Secondary | ICD-10-CM | POA: Diagnosis not present

## 2018-04-21 DIAGNOSIS — I2510 Atherosclerotic heart disease of native coronary artery without angina pectoris: Secondary | ICD-10-CM | POA: Diagnosis not present

## 2018-04-21 DIAGNOSIS — N183 Chronic kidney disease, stage 3 (moderate): Secondary | ICD-10-CM | POA: Diagnosis not present

## 2018-04-21 DIAGNOSIS — G4700 Insomnia, unspecified: Secondary | ICD-10-CM | POA: Diagnosis not present

## 2018-04-21 DIAGNOSIS — R350 Frequency of micturition: Secondary | ICD-10-CM | POA: Diagnosis not present

## 2018-04-21 DIAGNOSIS — Z86711 Personal history of pulmonary embolism: Secondary | ICD-10-CM | POA: Diagnosis not present

## 2018-05-06 NOTE — Progress Notes (Signed)
[facility]  PATIENT: Corey Larson. DOB: 1928/01/20   REASON FOR VISIT: Follow-up for mild memory loss, B12 deficiency HISTORY FROM: Patient and wife    HISTORY OF PRESENT ILLNESS:UPDATE 12/14/2019CM Mr Biedermann 84 year old male returns for follow-up with history of mild memory disturbance and B12 deficiency.  He had also been off of his Aricept for a while but is now back on his medication and is on a B12 supplement.  B12 level 5/16 /2019 was 847.  Wife does the finances.  He is independent with activities of daily living.  He drives very little and only with supervision.  He returns for reevaluation  5/16/19KWMr. Couser is an 84 year old right-handed white male with a history of a memory disturbance.  The patient was found to have a B12 deficiency on his last visit, he has been on vitamin B12 supplementation.  He has been off of Aricept, he does drive a car occasionally, but he does have difficulty with directions.  He has not gotten back to playing the bills and doing the finances, his wife has taken over this.  He has his days and nights mixed up, he tends to stay awake at night and sleep during the day.  He has a peripheral neuropathy, he has gait instability associated with this, he fell on 30 August 2017, he went to the emergency room.  He has recovered fairly well from this.  The patient will take gabapentin at nighttime to help him sleep, but this may make him drowsy during the day and more staggery.  He has been able to maintain his weight.  He returns for an evaluation.  REVIEW OF SYSTEMS: Full 14 system review of systems performed and notable only for those listed, all others are neg:  Constitutional: neg  Cardiovascular: neg Ear/Nose/Throat: neg  Skin: neg Eyes: neg Respiratory: neg Gastroitestinal: neg  Hematology/Lymphatic: neg  Endocrine: neg Musculoskeletal:neg Allergy/Immunology: neg Neurological: Memory loss Psychiatric: neg Sleep :  neg   ALLERGIES: Allergies  Allergen Reactions  . Klonopin [Clonazepam] Other (See Comments)    Sleep walking episodes  . Ambien [Zolpidem] Other (See Comments)    Sleep walking episodes   . Belsomra [Suvorexant]     Sleep walking  . Zolpidem Tartrate     HOME MEDICATIONS: Outpatient Medications Prior to Visit  Medication Sig Dispense Refill  . B Complex-C (B-COMPLEX WITH VITAMIN C) tablet Take 1 tablet by mouth daily.    [name] donepezil (ARICEPT) 5 MG tablet Take 1 tablet (5 mg total) by mouth at bedtime. 30 tablet 0  . gabapentin (NEURONTIN) 800 MG tablet Take 800 mg by mouth at bedtime.    . Liniments (BLUE-EMU SUPER STRENGTH) CREA Apply 1 application topically 2 (two) times daily. Use on both feet    . Multiple Vitamins-Minerals (MULTIVITAMIN ADULT PO) Take 1 tablet by mouth daily as needed. Takes One-A-Day for Men    . nitroGLYCERIN (NITROSTAT) 0.4 MG SL tablet Place 1 tablet (0.4 mg total) under the tongue every 5 (five) minutes x 3 doses as needed for chest pain. 25 tablet 12  . NONFORMULARY OR COMPOUNDED ITEM [facility]  Peripheral Neuropathy Cream- Bupivacaine 1%, Doxepin 3%, Gabapentin 6%, Pentoxifylline 3%, Topiramate 1% Apply 1-2 grams to affected area 3-4 times daily Qty. 120 gm 3 refills 1 each 3  . omeprazole (PRILOSEC) 20 MG capsule Take 20 mg by mouth daily.    . vitamin B-12 (CYANOCOBALAMIN) 1000 MCG tablet Take 1,000 mcg daily by mouth.    [name]  ranitidine (ZANTAC) 75 MG tablet Take 75 mg by mouth daily as needed for heartburn.    . rivaroxaban (XARELTO) 20 MG TABS tablet Take 20 mg by mouth daily with supper.     No facility-administered medications prior to visit.     PAST MEDICAL HISTORY: Past Medical History:  Diagnosis Date  . Abnormality of gait 11/05/2013  . CAD (coronary artery disease) 2005   stent to LAD by Dr. Glade Lloyd. mild irregulatities of RCA.    [name] CRF (chronic renal failure)   . ED (erectile dysfunction)   . H/O exercise stress test  05/2012   no statistically significant ischemia.   [name] History of TIA (transient ischemic attack)    transient confusion  . HOH (hard of hearing)    hearing aids  . Hyperlipidemia LDL goal < 70   . Hypertension   . Insomnia   . Macular degeneration    bilateral  . Macular degeneration    Right>left  . Memory difficulty 04/15/2017  . NSTEMI (non-ST elevated myocardial infarction) ([place]) 04/16/2013  . OSA (obstructive sleep apnea)    mild, never on cpap  . Peripheral neuropathy   . Vitamin D deficiency     PAST SURGICAL HISTORY: Past Surgical History:  Procedure Laterality Date  . ANKLE SURGERY     right ankle 2002 fracture  . BACK SURGERY  1976   laminectomy  . CARDIAC CATHETERIZATION     2005, stent placed DES LAD  . CATARACT EXTRACTION     bilateral  . CORONARY ANGIOPLASTY    . HERNIA REPAIR  1981   "double hernia"  . KNEE SURGERY     right knee 2011   . LEFT HEART CATHETERIZATION WITH CORONARY ANGIOGRAM N/A 04/16/2013   Procedure: LEFT HEART CATHETERIZATION WITH CORONARY ANGIOGRAM;  Surgeon: Leonie Man, MD;  Location: [facility] CATH LAB;  Service: Cardiovascular;  Laterality: N/A;  . NM MYOCAR MULTIPLE W/SPECT  05/13/2012   MILD UPPER SEPTAL THINNING AND DIAPHRAGMATIC ATTENUATION W/O SIGNIFICANT ISCHEMIA. EF 66%.  . TRANSTHORACIC ECHOCARDIOGRAM  05/12/2010   EF =>55%. MILD CONCENTRIC LVH. TRACE MITRAL REGURG.    FAMILY HISTORY: Family History  Problem Relation Age of Onset  . Diabetes Sister   . Alcohol abuse Brother   . Cancer Maternal Grandmother   . Heart disease Paternal Grandfather     SOCIAL HISTORY: Social History   Socioeconomic History  . Marital status: Married    Spouse name: Not on file  . Number of children: 3  . Years of education: Dr of Therapist, sports  . Highest education level: Not on file  Occupational History  . Occupation: Retired [employer]  Social Needs  . Financial resource strain: Not on file  . Food insecurity:    Worry: Not on  file    Inability: Not on file  . Transportation needs:    Medical: Not on file    Non-medical: Not on file  Tobacco Use  . Smoking status: Former Smoker    Years: 30.00    Types: Cigarettes, Pipe    Last attempt to quit: 04/16/1983    Years since quitting: 35.0  . Smokeless tobacco: Never Used  Substance and Sexual Activity  . Alcohol use: Yes    Comment: 1/2 glass per evening  . Drug use: No  . Sexual activity: Not on file  Lifestyle  . Physical activity:    Days per week: Not on file    Minutes per session: Not on file  .  Stress: Not on file  Relationships  . Social connections:    Talks on phone: Not on file    Gets together: Not on file    Attends religious service: Not on file    Active member of club or organization: Not on file    Attends meetings of clubs or organizations: Not on file    Relationship status: Not on file  . Intimate partner violence:    Fear of current or ex partner: Not on file    Emotionally abused: Not on file    Physically abused: Not on file    Forced sexual activity: Not on file  Other Topics Concern  . Not on file  Social History Narrative   Lives with wife.    Ambulatory without an assistive device, although his wife urges him to use a walker.    Not very active. Quit smoking in 1984.   Caffeine use: Coffee every morning   Sometimes tea   Right handed     PHYSICAL EXAM  Vitals:   05/07/18 1516  BP: 125/81  Pulse: 75  Weight: 152 lb 6.4 oz (69.1 kg)  Height: 5' 6.5" (1.689 m)   Body mass index is 24.23 kg/m.  Generalized: Well developed, in no acute distress  Head: normocephalic and atraumatic,. Oropharynx benign  Neck: Supple,  Musculoskeletal: No deformity  Skin no rash or edema Neurological examination   Mentation: Alert  MMSE - Mini Mental State Exam 05/07/2018 10/17/2017 04/15/2017  Not completed: (No Data) - -  Orientation to time 5 4 4   Orientation to Place 5 5 5   Registration 3 3 3   Attention/ Calculation 5  5 5   Recall 2 2 3   Language- name 2 objects 2 2 2   Language- repeat 1 1 1   Language- follow 3 step command 3 3 3   Language- read & follow direction 1 1 1   Write a sentence 1 1 1   Copy design 0 0 1  Total score 28 27 29     Follows all commands speech and language fluent.   Cranial nerve II-XII: Pupils were equal round reactive to light extraocular movements were full, visual field were full on confrontational test. Facial sensation and strength were normal. hearing was intact to finger rubbing bilaterally. Uvula tongue midline. head turning and shoulder shrug were normal and symmetric.Tongue protrusion into cheek strength was normal. Motor: normal bulk and tone, full strength in the BUE, BLE,  Sensory: normal and symmetric to light touch,  Coordination: finger-nose-finger, heel-to-shin bilaterally, no dysmetria Reflexes: Depressed and symmetric upper and lower, plantar responses were flexor bilaterally. Gait and Station: Rising up from seated position without assistance, normal stance,  moderate stride, good arm swing, smooth turning, able to perform tiptoe, and heel walking without difficulty. Tandem gait is unsteady.  No assistive device DIAGNOSTIC DATA (LABS, IMAGING, TESTING) - I reviewed patient records, labs, notes, testing and imaging myself where available.  Lab Results  Component Value Date   WBC 5.7 08/30/2017   HGB 13.1 08/30/2017   HCT 39.8 08/30/2017   MCV 83.8 08/30/2017   PLT 196 08/30/2017      Component Value Date/Time   NA 138 08/30/2017 1621   K 3.9 08/30/2017 1621   CL 109 08/30/2017 1621   CO2 20 (L) 08/30/2017 1621   GLUCOSE 100 (H) 08/30/2017 1621   BUN 21 (H) 08/30/2017 1621   CREATININE 0.92 08/30/2017 1621   CALCIUM 8.8 (L) 08/30/2017 1621   PROT 8.4 (H) 01/08/2017 1659  ALBUMIN 4.3 01/08/2017 1659   AST 24 01/08/2017 1659   ALT 14 (L) 01/08/2017 1659   ALKPHOS 67 01/08/2017 1659   BILITOT 1.5 (H) 01/08/2017 1659   GFRNONAA >60 08/30/2017 1621    GFRAA >60 08/30/2017 1621   Lab Results  Component Value Date   CHOL 105 04/16/2013   HDL 42 04/16/2013   LDLCALC 50 04/16/2013   TRIG 65 04/16/2013   CHOLHDL 2.5 04/16/2013   Lab Results  Component Value Date   HGBA1C 6.0 (H) 04/15/2013   Lab Results  Component Value Date   GYKZLDJT70 177 10/17/2017   Lab Results  Component Value Date   TSH 1.012 04/15/2013      ASSESSMENT AND PLAN  84 y.o. year old male  has a past medical history of Abnormality of gait (11/05/2013), CAD (coronary artery disease) (2005), CRF (chronic renal failure),  History of TIA (transient ischemic attack), , Hyperlipidemia LDL goal < 70, Hypertension, Insomnia, Macular degeneration,  Memory difficulty (04/15/2017), NSTEMI (non-ST elevated myocardial infarction) ([place]) (04/16/2013), OSA (obstructive sleep apnea), Peripheral neuropathy, and Vitamin D deficiency.  And vitamin B-12 deficiency here to follow-up for his memory disturbance.   Continue Aricept daily after breakfast 5 mg refilled by PCP Continue vitamin B12 supplement Be careful with ambulation Follow-up in 6 months Dennie Bible, [facility], [facility], APRN  [facility] [address] [place], [place] [address] [phone]

## 2018-05-07 ENCOUNTER — Ambulatory Visit (INDEPENDENT_AMBULATORY_CARE_PROVIDER_SITE_OTHER): Payer: Medicare Other | Admitting: Nurse Practitioner

## 2018-05-07 ENCOUNTER — Encounter: Payer: Self-pay | Admitting: Nurse Practitioner

## 2018-05-07 VITALS — BP 125/81 | HR 75 | Ht 66.5 in | Wt 152.4 lb

## 2018-05-07 DIAGNOSIS — R269 Unspecified abnormalities of gait and mobility: Secondary | ICD-10-CM | POA: Diagnosis not present

## 2018-05-07 DIAGNOSIS — R413 Other amnesia: Secondary | ICD-10-CM | POA: Diagnosis not present

## 2018-05-07 NOTE — Progress Notes (Signed)
I have read the note, and I agree with the clinical assessment and plan.  Corey Larson   

## 2018-05-07 NOTE — Patient Instructions (Signed)
Continue Aricept daily after breakfast 5 mg will refill Continue vitamin B12 supplement Follow-up in 6 months

## 2018-06-02 ENCOUNTER — Ambulatory Visit (INDEPENDENT_AMBULATORY_CARE_PROVIDER_SITE_OTHER): Payer: Medicare Other | Admitting: Podiatry

## 2018-06-02 ENCOUNTER — Encounter: Payer: Self-pay | Admitting: Podiatry

## 2018-06-02 DIAGNOSIS — G609 Hereditary and idiopathic neuropathy, unspecified: Secondary | ICD-10-CM

## 2018-06-02 DIAGNOSIS — L84 Corns and callosities: Secondary | ICD-10-CM | POA: Diagnosis not present

## 2018-06-09 DIAGNOSIS — L720 Epidermal cyst: Secondary | ICD-10-CM | POA: Diagnosis not present

## 2018-06-09 DIAGNOSIS — Z85828 Personal history of other malignant neoplasm of skin: Secondary | ICD-10-CM | POA: Diagnosis not present

## 2018-06-09 DIAGNOSIS — D485 Neoplasm of uncertain behavior of skin: Secondary | ICD-10-CM | POA: Diagnosis not present

## 2018-06-09 DIAGNOSIS — L570 Actinic keratosis: Secondary | ICD-10-CM | POA: Diagnosis not present

## 2018-06-09 DIAGNOSIS — B353 Tinea pedis: Secondary | ICD-10-CM | POA: Diagnosis not present

## 2018-06-18 DIAGNOSIS — H3122 Choroidal dystrophy (central areolar) (generalized) (peripapillary): Secondary | ICD-10-CM | POA: Diagnosis not present

## 2018-06-18 DIAGNOSIS — H35363 Drusen (degenerative) of macula, bilateral: Secondary | ICD-10-CM | POA: Diagnosis not present

## 2018-06-18 DIAGNOSIS — Z961 Presence of intraocular lens: Secondary | ICD-10-CM | POA: Diagnosis not present

## 2018-06-18 DIAGNOSIS — H35453 Secondary pigmentary degeneration, bilateral: Secondary | ICD-10-CM | POA: Diagnosis not present

## 2018-06-18 DIAGNOSIS — H53411 Scotoma involving central area, right eye: Secondary | ICD-10-CM | POA: Diagnosis not present

## 2018-06-18 DIAGNOSIS — H35433 Paving stone degeneration of retina, bilateral: Secondary | ICD-10-CM | POA: Diagnosis not present

## 2018-06-18 DIAGNOSIS — H353213 Exudative age-related macular degeneration, right eye, with inactive scar: Secondary | ICD-10-CM | POA: Diagnosis not present

## 2018-06-18 DIAGNOSIS — H31091 Other chorioretinal scars, right eye: Secondary | ICD-10-CM | POA: Diagnosis not present

## 2018-06-18 DIAGNOSIS — H353221 Exudative age-related macular degeneration, left eye, with active choroidal neovascularization: Secondary | ICD-10-CM | POA: Diagnosis not present

## 2018-06-28 NOTE — Progress Notes (Signed)
Subjective:  Patient presents to clinic with cc of painful callus submetatarsal head 5 right foot.  This pain limits his daily activities.  The pain is present when weightbearing with and without shoe gear.  He denies any redness, swelling, or drainage from the area.  Pain symptoms resolve with periodic professional debridement.  Corey Larson has history of peripheral neuropathy which is managed with gabapentin orally.  [name], MD is his PCP.   Current Outpatient Medications:  .  B Complex-C (B-COMPLEX WITH VITAMIN C) tablet, Take 1 tablet by mouth daily., Disp: , Rfl:  .  donepezil (ARICEPT) 5 MG tablet, Take 1 tablet (5 mg total) by mouth at bedtime. (Patient taking differently: Take 5 mg by mouth daily after breakfast. ), Disp: 30 tablet, Rfl: 0 .  gabapentin (NEURONTIN) 800 MG tablet, Take 800 mg by mouth at bedtime., Disp: , Rfl:  .  Liniments (BLUE-EMU SUPER STRENGTH) CREA, Apply 1 application topically 2 (two) times daily. Use on both feet, Disp: , Rfl:  .  Multiple Vitamins-Minerals (MULTIVITAMIN ADULT PO), Take 1 tablet by mouth daily as needed. Takes One-A-Day for Men, Disp: , Rfl:  .  nitroGLYCERIN (NITROSTAT) 0.4 MG SL tablet, Place 1 tablet (0.4 mg total) under the tongue every 5 (five) minutes x 3 doses as needed for chest pain., Disp: 25 tablet, Rfl: 12 .  NONFORMULARY OR COMPOUNDED ITEM, [facility]  Peripheral Neuropathy Cream- Bupivacaine 1%, Doxepin 3%, Gabapentin 6%, Pentoxifylline 3%, Topiramate 1% Apply 1-2 grams to affected area 3-4 times daily Qty. 120 gm 3 refills, Disp: 1 each, Rfl: 3 .  omeprazole (PRILOSEC) 20 MG capsule, Take 20 mg by mouth daily., Disp: , Rfl:  .  ranitidine (ZANTAC) 75 MG tablet, Take 75 mg by mouth daily as needed for heartburn., Disp: , Rfl:  .  rivaroxaban (XARELTO) 20 MG TABS tablet, Take 20 mg by mouth daily with supper., Disp: , Rfl:  .  vitamin B-12 (CYANOCOBALAMIN) 1000 MCG tablet, Take 1,000 mcg daily by mouth., Disp: , Rfl:     Allergies  Allergen Reactions  . Klonopin [Clonazepam] Other (See Comments)    Sleep walking episodes  . Ambien [Zolpidem] Other (See Comments)    Sleep walking episodes   . Belsomra [Suvorexant]     Sleep walking  . Zolpidem Tartrate      Objective:  Physical Examination: Neurovascular status objectively intact b/l feet.  He does have subjective symptoms of peripheral neuropathy.  Muscle strength 5/5 to all muscle groups b/l LE  Hyperkeratotic submetatarsal head 5 right foot.  There is pain on palpation of the lesion.  There is no edema, no erythema, no drainage, no flocculence, no impending wound.  Assessment: Painful callus submetatarsal head 5 right foot Peripheral neuropathy  Plan: Painful hyperkeratotic lesion pared submetatarsal head 5 right foot Continue soft, supportive shoe gear daily. Report any pedal injuries to medical professional Follow up 9 weeks Call our office should there be any questions/concerns in the interim.

## 2018-06-30 ENCOUNTER — Ambulatory Visit: Payer: PRIVATE HEALTH INSURANCE | Admitting: Podiatry

## 2018-07-14 DIAGNOSIS — Z961 Presence of intraocular lens: Secondary | ICD-10-CM | POA: Diagnosis not present

## 2018-07-14 DIAGNOSIS — H524 Presbyopia: Secondary | ICD-10-CM | POA: Diagnosis not present

## 2018-07-14 DIAGNOSIS — H52223 Regular astigmatism, bilateral: Secondary | ICD-10-CM | POA: Diagnosis not present

## 2018-08-05 DIAGNOSIS — H353221 Exudative age-related macular degeneration, left eye, with active choroidal neovascularization: Secondary | ICD-10-CM | POA: Diagnosis not present

## 2018-08-05 DIAGNOSIS — Z961 Presence of intraocular lens: Secondary | ICD-10-CM | POA: Diagnosis not present

## 2018-08-05 DIAGNOSIS — H43813 Vitreous degeneration, bilateral: Secondary | ICD-10-CM | POA: Diagnosis not present

## 2018-08-06 DIAGNOSIS — J310 Chronic rhinitis: Secondary | ICD-10-CM | POA: Diagnosis not present

## 2018-08-06 DIAGNOSIS — K219 Gastro-esophageal reflux disease without esophagitis: Secondary | ICD-10-CM | POA: Diagnosis not present

## 2018-08-06 DIAGNOSIS — R0982 Postnasal drip: Secondary | ICD-10-CM | POA: Diagnosis not present

## 2018-08-12 ENCOUNTER — Ambulatory Visit: Payer: PRIVATE HEALTH INSURANCE | Admitting: Podiatry

## 2018-08-12 DIAGNOSIS — Z79899 Other long term (current) drug therapy: Secondary | ICD-10-CM | POA: Diagnosis not present

## 2018-08-12 DIAGNOSIS — R413 Other amnesia: Secondary | ICD-10-CM | POA: Diagnosis not present

## 2018-08-12 DIAGNOSIS — I2510 Atherosclerotic heart disease of native coronary artery without angina pectoris: Secondary | ICD-10-CM | POA: Diagnosis not present

## 2018-08-12 DIAGNOSIS — G629 Polyneuropathy, unspecified: Secondary | ICD-10-CM | POA: Diagnosis not present

## 2018-08-12 DIAGNOSIS — E559 Vitamin D deficiency, unspecified: Secondary | ICD-10-CM | POA: Diagnosis not present

## 2018-08-12 DIAGNOSIS — Z1331 Encounter for screening for depression: Secondary | ICD-10-CM | POA: Diagnosis not present

## 2018-08-12 DIAGNOSIS — E785 Hyperlipidemia, unspecified: Secondary | ICD-10-CM | POA: Diagnosis not present

## 2018-08-12 DIAGNOSIS — I10 Essential (primary) hypertension: Secondary | ICD-10-CM | POA: Diagnosis not present

## 2018-08-12 DIAGNOSIS — M4800 Spinal stenosis, site unspecified: Secondary | ICD-10-CM | POA: Diagnosis not present

## 2018-08-12 DIAGNOSIS — E538 Deficiency of other specified B group vitamins: Secondary | ICD-10-CM | POA: Diagnosis not present

## 2018-08-12 DIAGNOSIS — N183 Chronic kidney disease, stage 3 (moderate): Secondary | ICD-10-CM | POA: Diagnosis not present

## 2018-08-12 DIAGNOSIS — N400 Enlarged prostate without lower urinary tract symptoms: Secondary | ICD-10-CM | POA: Diagnosis not present

## 2018-08-30 DIAGNOSIS — R82998 Other abnormal findings in urine: Secondary | ICD-10-CM | POA: Diagnosis not present

## 2018-08-30 DIAGNOSIS — Z6822 Body mass index (BMI) 22.0-22.9, adult: Secondary | ICD-10-CM | POA: Diagnosis not present

## 2018-08-30 DIAGNOSIS — R197 Diarrhea, unspecified: Secondary | ICD-10-CM | POA: Diagnosis not present

## 2018-08-30 DIAGNOSIS — R4182 Altered mental status, unspecified: Secondary | ICD-10-CM | POA: Diagnosis not present

## 2018-08-30 DIAGNOSIS — N390 Urinary tract infection, site not specified: Secondary | ICD-10-CM | POA: Diagnosis not present

## 2018-09-01 ENCOUNTER — Telehealth: Payer: Self-pay | Admitting: Neurology

## 2018-09-01 NOTE — Telephone Encounter (Signed)
Pt's wife Romie Minus states he was staying with daughter and son-in-law in [place]. He was seen by another provider in [place] and was told his memory was fine and that he did not need to take donepezil (ARICEPT) 5 MG tablet anymore. She has spoke with him over the phone the past 3 days and does not sound right. He is now staying with his son and daughter-in-law in [place], [place]. He is very confused, doesn't understand the distance between them. He is suppose to go to [place] with daughter and son-in-law to second home, he is not wanting to go there. She is very worried about him. Please call to advise

## 2018-09-02 NOTE — Telephone Encounter (Signed)
I contacted the pt's wife ( jean ok per dpr). She advised me the pt's son did pick up the prescription for the aricept on 09/01/18 and when she spoke with the pt last night he sounded more like him self. She states the pt is currently seeing another neurologist and will be getting his refill for his Aricept from the other provider. Pt's wife had no further questions or concerns at this time.

## 2018-09-17 DIAGNOSIS — F329 Major depressive disorder, single episode, unspecified: Secondary | ICD-10-CM | POA: Diagnosis not present

## 2018-09-17 DIAGNOSIS — H353113 Nonexudative age-related macular degeneration, right eye, advanced atrophic without subfoveal involvement: Secondary | ICD-10-CM | POA: Diagnosis not present

## 2018-09-17 DIAGNOSIS — F419 Anxiety disorder, unspecified: Secondary | ICD-10-CM | POA: Diagnosis not present

## 2018-09-17 DIAGNOSIS — H353221 Exudative age-related macular degeneration, left eye, with active choroidal neovascularization: Secondary | ICD-10-CM | POA: Diagnosis not present

## 2018-09-23 DIAGNOSIS — F329 Major depressive disorder, single episode, unspecified: Secondary | ICD-10-CM | POA: Diagnosis not present

## 2018-09-23 DIAGNOSIS — F419 Anxiety disorder, unspecified: Secondary | ICD-10-CM | POA: Diagnosis not present

## 2018-09-27 DIAGNOSIS — F418 Other specified anxiety disorders: Secondary | ICD-10-CM | POA: Diagnosis not present

## 2018-09-29 DIAGNOSIS — I10 Essential (primary) hypertension: Secondary | ICD-10-CM | POA: Diagnosis not present

## 2018-09-29 DIAGNOSIS — F418 Other specified anxiety disorders: Secondary | ICD-10-CM | POA: Diagnosis not present

## 2018-09-29 DIAGNOSIS — R413 Other amnesia: Secondary | ICD-10-CM | POA: Diagnosis not present

## 2018-10-01 DIAGNOSIS — F419 Anxiety disorder, unspecified: Secondary | ICD-10-CM | POA: Diagnosis not present

## 2018-10-01 DIAGNOSIS — F329 Major depressive disorder, single episode, unspecified: Secondary | ICD-10-CM | POA: Diagnosis not present

## 2018-10-06 DIAGNOSIS — B353 Tinea pedis: Secondary | ICD-10-CM | POA: Diagnosis not present

## 2018-10-06 DIAGNOSIS — I739 Peripheral vascular disease, unspecified: Secondary | ICD-10-CM | POA: Diagnosis not present

## 2018-10-06 DIAGNOSIS — L84 Corns and callosities: Secondary | ICD-10-CM | POA: Diagnosis not present

## 2018-10-06 DIAGNOSIS — M79672 Pain in left foot: Secondary | ICD-10-CM | POA: Diagnosis not present

## 2018-10-06 DIAGNOSIS — M79671 Pain in right foot: Secondary | ICD-10-CM | POA: Diagnosis not present

## 2018-10-06 DIAGNOSIS — L608 Other nail disorders: Secondary | ICD-10-CM | POA: Diagnosis not present

## 2018-10-08 DIAGNOSIS — F419 Anxiety disorder, unspecified: Secondary | ICD-10-CM | POA: Diagnosis not present

## 2018-10-08 DIAGNOSIS — F329 Major depressive disorder, single episode, unspecified: Secondary | ICD-10-CM | POA: Diagnosis not present

## 2018-10-14 DIAGNOSIS — R197 Diarrhea, unspecified: Secondary | ICD-10-CM | POA: Diagnosis not present

## 2018-10-14 DIAGNOSIS — I10 Essential (primary) hypertension: Secondary | ICD-10-CM | POA: Diagnosis not present

## 2018-10-14 DIAGNOSIS — Z79899 Other long term (current) drug therapy: Secondary | ICD-10-CM | POA: Diagnosis not present

## 2018-10-14 DIAGNOSIS — Z111 Encounter for screening for respiratory tuberculosis: Secondary | ICD-10-CM | POA: Diagnosis not present

## 2018-10-14 DIAGNOSIS — F418 Other specified anxiety disorders: Secondary | ICD-10-CM | POA: Diagnosis not present

## 2018-10-14 DIAGNOSIS — R413 Other amnesia: Secondary | ICD-10-CM | POA: Diagnosis not present

## 2018-10-15 DIAGNOSIS — F329 Major depressive disorder, single episode, unspecified: Secondary | ICD-10-CM | POA: Diagnosis not present

## 2018-10-15 DIAGNOSIS — F419 Anxiety disorder, unspecified: Secondary | ICD-10-CM | POA: Diagnosis not present

## 2018-10-15 DIAGNOSIS — R197 Diarrhea, unspecified: Secondary | ICD-10-CM | POA: Diagnosis not present

## 2018-10-22 DIAGNOSIS — F329 Major depressive disorder, single episode, unspecified: Secondary | ICD-10-CM | POA: Diagnosis not present

## 2018-10-22 DIAGNOSIS — F419 Anxiety disorder, unspecified: Secondary | ICD-10-CM | POA: Diagnosis not present

## 2018-10-25 DIAGNOSIS — R600 Localized edema: Secondary | ICD-10-CM | POA: Diagnosis not present

## 2018-10-25 DIAGNOSIS — I82452 Acute embolism and thrombosis of left peroneal vein: Secondary | ICD-10-CM | POA: Diagnosis not present

## 2018-10-25 DIAGNOSIS — I82492 Acute embolism and thrombosis of other specified deep vein of left lower extremity: Secondary | ICD-10-CM | POA: Diagnosis not present

## 2018-10-27 DIAGNOSIS — K5730 Diverticulosis of large intestine without perforation or abscess without bleeding: Secondary | ICD-10-CM | POA: Diagnosis not present

## 2018-10-27 DIAGNOSIS — Z7901 Long term (current) use of anticoagulants: Secondary | ICD-10-CM | POA: Diagnosis not present

## 2018-10-27 DIAGNOSIS — R609 Edema, unspecified: Secondary | ICD-10-CM | POA: Diagnosis not present

## 2018-10-27 DIAGNOSIS — R52 Pain, unspecified: Secondary | ICD-10-CM | POA: Diagnosis not present

## 2018-10-27 DIAGNOSIS — K8020 Calculus of gallbladder without cholecystitis without obstruction: Secondary | ICD-10-CM | POA: Diagnosis not present

## 2018-10-27 DIAGNOSIS — I878 Other specified disorders of veins: Secondary | ICD-10-CM | POA: Diagnosis not present

## 2018-10-27 DIAGNOSIS — M79604 Pain in right leg: Secondary | ICD-10-CM | POA: Diagnosis not present

## 2018-10-27 DIAGNOSIS — M25061 Hemarthrosis, right knee: Secondary | ICD-10-CM | POA: Diagnosis not present

## 2018-10-27 DIAGNOSIS — Z87891 Personal history of nicotine dependence: Secondary | ICD-10-CM | POA: Diagnosis not present

## 2018-10-28 DIAGNOSIS — M25069 Hemarthrosis, unspecified knee: Secondary | ICD-10-CM | POA: Diagnosis not present

## 2018-10-28 DIAGNOSIS — F0390 Unspecified dementia without behavioral disturbance: Secondary | ICD-10-CM | POA: Diagnosis not present

## 2018-10-28 DIAGNOSIS — M791 Myalgia, unspecified site: Secondary | ICD-10-CM | POA: Diagnosis not present

## 2018-10-28 DIAGNOSIS — I10 Essential (primary) hypertension: Secondary | ICD-10-CM | POA: Diagnosis not present

## 2018-10-28 DIAGNOSIS — N401 Enlarged prostate with lower urinary tract symptoms: Secondary | ICD-10-CM | POA: Diagnosis not present

## 2018-10-28 DIAGNOSIS — I82409 Acute embolism and thrombosis of unspecified deep veins of unspecified lower extremity: Secondary | ICD-10-CM | POA: Diagnosis not present

## 2018-10-28 DIAGNOSIS — Z20828 Contact with and (suspected) exposure to other viral communicable diseases: Secondary | ICD-10-CM | POA: Diagnosis not present

## 2018-10-28 DIAGNOSIS — F418 Other specified anxiety disorders: Secondary | ICD-10-CM | POA: Diagnosis not present

## 2018-10-28 DIAGNOSIS — R350 Frequency of micturition: Secondary | ICD-10-CM | POA: Diagnosis not present

## 2018-10-28 DIAGNOSIS — R197 Diarrhea, unspecified: Secondary | ICD-10-CM | POA: Diagnosis not present

## 2018-10-28 DIAGNOSIS — Z03818 Encounter for observation for suspected exposure to other biological agents ruled out: Secondary | ICD-10-CM | POA: Diagnosis not present

## 2018-10-29 DIAGNOSIS — F4323 Adjustment disorder with mixed anxiety and depressed mood: Secondary | ICD-10-CM | POA: Diagnosis not present

## 2018-10-29 DIAGNOSIS — R2689 Other abnormalities of gait and mobility: Secondary | ICD-10-CM | POA: Diagnosis not present

## 2018-10-29 DIAGNOSIS — M791 Myalgia, unspecified site: Secondary | ICD-10-CM | POA: Diagnosis not present

## 2018-10-29 DIAGNOSIS — R1013 Epigastric pain: Secondary | ICD-10-CM | POA: Diagnosis not present

## 2018-10-29 DIAGNOSIS — K219 Gastro-esophageal reflux disease without esophagitis: Secondary | ICD-10-CM | POA: Diagnosis not present

## 2018-10-29 DIAGNOSIS — F329 Major depressive disorder, single episode, unspecified: Secondary | ICD-10-CM | POA: Diagnosis not present

## 2018-10-29 DIAGNOSIS — F419 Anxiety disorder, unspecified: Secondary | ICD-10-CM | POA: Diagnosis not present

## 2018-10-29 DIAGNOSIS — M25061 Hemarthrosis, right knee: Secondary | ICD-10-CM | POA: Diagnosis not present

## 2018-10-29 DIAGNOSIS — I82502 Chronic embolism and thrombosis of unspecified deep veins of left lower extremity: Secondary | ICD-10-CM | POA: Diagnosis not present

## 2018-10-29 DIAGNOSIS — F418 Other specified anxiety disorders: Secondary | ICD-10-CM | POA: Diagnosis not present

## 2018-10-29 DIAGNOSIS — I10 Essential (primary) hypertension: Secondary | ICD-10-CM | POA: Diagnosis not present

## 2018-10-29 DIAGNOSIS — Z86718 Personal history of other venous thrombosis and embolism: Secondary | ICD-10-CM | POA: Diagnosis not present

## 2018-10-29 DIAGNOSIS — F0390 Unspecified dementia without behavioral disturbance: Secondary | ICD-10-CM | POA: Diagnosis not present

## 2018-10-29 DIAGNOSIS — G9009 Other idiopathic peripheral autonomic neuropathy: Secondary | ICD-10-CM | POA: Diagnosis not present

## 2018-10-29 DIAGNOSIS — N401 Enlarged prostate with lower urinary tract symptoms: Secondary | ICD-10-CM | POA: Diagnosis not present

## 2018-10-29 DIAGNOSIS — F349 Persistent mood [affective] disorder, unspecified: Secondary | ICD-10-CM | POA: Diagnosis not present

## 2018-10-29 DIAGNOSIS — M6281 Muscle weakness (generalized): Secondary | ICD-10-CM | POA: Diagnosis not present

## 2018-10-29 DIAGNOSIS — R197 Diarrhea, unspecified: Secondary | ICD-10-CM | POA: Diagnosis not present

## 2018-10-29 DIAGNOSIS — G3184 Mild cognitive impairment, so stated: Secondary | ICD-10-CM | POA: Diagnosis not present

## 2018-10-29 DIAGNOSIS — I82509 Chronic embolism and thrombosis of unspecified deep veins of unspecified lower extremity: Secondary | ICD-10-CM | POA: Diagnosis not present

## 2018-10-29 DIAGNOSIS — Z03818 Encounter for observation for suspected exposure to other biological agents ruled out: Secondary | ICD-10-CM | POA: Diagnosis not present

## 2018-10-29 DIAGNOSIS — R2681 Unsteadiness on feet: Secondary | ICD-10-CM | POA: Diagnosis not present

## 2018-10-29 DIAGNOSIS — G629 Polyneuropathy, unspecified: Secondary | ICD-10-CM | POA: Diagnosis not present

## 2018-10-29 DIAGNOSIS — R634 Abnormal weight loss: Secondary | ICD-10-CM | POA: Diagnosis not present

## 2018-10-29 DIAGNOSIS — M2500 Hemarthrosis, unspecified joint: Secondary | ICD-10-CM | POA: Diagnosis not present

## 2018-10-29 DIAGNOSIS — M25069 Hemarthrosis, unspecified knee: Secondary | ICD-10-CM | POA: Diagnosis not present

## 2018-10-29 DIAGNOSIS — I82409 Acute embolism and thrombosis of unspecified deep veins of unspecified lower extremity: Secondary | ICD-10-CM | POA: Diagnosis not present

## 2018-11-03 ENCOUNTER — Telehealth: Payer: Self-pay

## 2018-11-03 NOTE — Telephone Encounter (Signed)
Spoke with the patient's wife and she stated that her husband now resides in nursing home in [place], [place]. She asked that the appt be cancelled. Appt has been cancelled.

## 2018-11-04 DIAGNOSIS — F0390 Unspecified dementia without behavioral disturbance: Secondary | ICD-10-CM | POA: Diagnosis not present

## 2018-11-04 DIAGNOSIS — K219 Gastro-esophageal reflux disease without esophagitis: Secondary | ICD-10-CM | POA: Diagnosis not present

## 2018-11-04 DIAGNOSIS — F418 Other specified anxiety disorders: Secondary | ICD-10-CM | POA: Diagnosis not present

## 2018-11-04 DIAGNOSIS — M25069 Hemarthrosis, unspecified knee: Secondary | ICD-10-CM | POA: Diagnosis not present

## 2018-11-04 DIAGNOSIS — R197 Diarrhea, unspecified: Secondary | ICD-10-CM | POA: Diagnosis not present

## 2018-11-04 DIAGNOSIS — I82409 Acute embolism and thrombosis of unspecified deep veins of unspecified lower extremity: Secondary | ICD-10-CM | POA: Diagnosis not present

## 2018-11-05 DIAGNOSIS — I82409 Acute embolism and thrombosis of unspecified deep veins of unspecified lower extremity: Secondary | ICD-10-CM | POA: Diagnosis not present

## 2018-11-05 DIAGNOSIS — F419 Anxiety disorder, unspecified: Secondary | ICD-10-CM | POA: Diagnosis not present

## 2018-11-05 DIAGNOSIS — R634 Abnormal weight loss: Secondary | ICD-10-CM | POA: Diagnosis not present

## 2018-11-05 DIAGNOSIS — M2500 Hemarthrosis, unspecified joint: Secondary | ICD-10-CM | POA: Diagnosis not present

## 2018-11-06 ENCOUNTER — Ambulatory Visit: Payer: Medicare Other | Admitting: Neurology

## 2018-11-12 DIAGNOSIS — G629 Polyneuropathy, unspecified: Secondary | ICD-10-CM | POA: Diagnosis not present

## 2018-11-12 DIAGNOSIS — I82409 Acute embolism and thrombosis of unspecified deep veins of unspecified lower extremity: Secondary | ICD-10-CM | POA: Diagnosis not present

## 2018-11-12 DIAGNOSIS — G3184 Mild cognitive impairment, so stated: Secondary | ICD-10-CM | POA: Diagnosis not present

## 2018-11-12 DIAGNOSIS — M2500 Hemarthrosis, unspecified joint: Secondary | ICD-10-CM | POA: Diagnosis not present

## 2018-11-15 DIAGNOSIS — F329 Major depressive disorder, single episode, unspecified: Secondary | ICD-10-CM | POA: Diagnosis not present

## 2018-11-18 DIAGNOSIS — I10 Essential (primary) hypertension: Secondary | ICD-10-CM | POA: Diagnosis not present

## 2018-11-18 DIAGNOSIS — H3530 Unspecified macular degeneration: Secondary | ICD-10-CM | POA: Diagnosis not present

## 2018-11-18 DIAGNOSIS — H9193 Unspecified hearing loss, bilateral: Secondary | ICD-10-CM | POA: Diagnosis not present

## 2018-11-18 DIAGNOSIS — Z9181 History of falling: Secondary | ICD-10-CM | POA: Diagnosis not present

## 2018-11-18 DIAGNOSIS — F4323 Adjustment disorder with mixed anxiety and depressed mood: Secondary | ICD-10-CM | POA: Diagnosis not present

## 2018-11-18 DIAGNOSIS — I82502 Chronic embolism and thrombosis of unspecified deep veins of left lower extremity: Secondary | ICD-10-CM | POA: Diagnosis not present

## 2018-11-18 DIAGNOSIS — R350 Frequency of micturition: Secondary | ICD-10-CM | POA: Diagnosis not present

## 2018-11-18 DIAGNOSIS — Z6821 Body mass index (BMI) 21.0-21.9, adult: Secondary | ICD-10-CM | POA: Diagnosis not present

## 2018-11-18 DIAGNOSIS — K219 Gastro-esophageal reflux disease without esophagitis: Secondary | ICD-10-CM | POA: Diagnosis not present

## 2018-11-18 DIAGNOSIS — Z7901 Long term (current) use of anticoagulants: Secondary | ICD-10-CM | POA: Diagnosis not present

## 2018-11-18 DIAGNOSIS — R262 Difficulty in walking, not elsewhere classified: Secondary | ICD-10-CM | POA: Diagnosis not present

## 2018-11-18 DIAGNOSIS — H5461 Unqualified visual loss, right eye, normal vision left eye: Secondary | ICD-10-CM | POA: Diagnosis not present

## 2018-11-18 DIAGNOSIS — G9009 Other idiopathic peripheral autonomic neuropathy: Secondary | ICD-10-CM | POA: Diagnosis not present

## 2018-11-18 DIAGNOSIS — R634 Abnormal weight loss: Secondary | ICD-10-CM | POA: Diagnosis not present

## 2018-11-18 DIAGNOSIS — N401 Enlarged prostate with lower urinary tract symptoms: Secondary | ICD-10-CM | POA: Diagnosis not present

## 2018-11-18 DIAGNOSIS — F0390 Unspecified dementia without behavioral disturbance: Secondary | ICD-10-CM | POA: Diagnosis not present

## 2018-11-18 DIAGNOSIS — M791 Myalgia, unspecified site: Secondary | ICD-10-CM | POA: Diagnosis not present

## 2018-11-18 DIAGNOSIS — M6281 Muscle weakness (generalized): Secondary | ICD-10-CM | POA: Diagnosis not present

## 2018-11-18 DIAGNOSIS — M25061 Hemarthrosis, right knee: Secondary | ICD-10-CM | POA: Diagnosis not present

## 2018-11-19 DIAGNOSIS — F329 Major depressive disorder, single episode, unspecified: Secondary | ICD-10-CM | POA: Diagnosis not present

## 2018-11-19 DIAGNOSIS — F419 Anxiety disorder, unspecified: Secondary | ICD-10-CM | POA: Diagnosis not present

## 2018-11-20 DIAGNOSIS — M4800 Spinal stenosis, site unspecified: Secondary | ICD-10-CM | POA: Diagnosis not present

## 2018-11-20 DIAGNOSIS — G629 Polyneuropathy, unspecified: Secondary | ICD-10-CM | POA: Diagnosis not present

## 2018-11-20 DIAGNOSIS — M25069 Hemarthrosis, unspecified knee: Secondary | ICD-10-CM | POA: Diagnosis not present

## 2018-11-20 DIAGNOSIS — R2681 Unsteadiness on feet: Secondary | ICD-10-CM | POA: Diagnosis not present

## 2018-11-20 DIAGNOSIS — I82409 Acute embolism and thrombosis of unspecified deep veins of unspecified lower extremity: Secondary | ICD-10-CM | POA: Diagnosis not present

## 2018-11-20 DIAGNOSIS — F418 Other specified anxiety disorders: Secondary | ICD-10-CM | POA: Diagnosis not present

## 2018-11-20 DIAGNOSIS — F0390 Unspecified dementia without behavioral disturbance: Secondary | ICD-10-CM | POA: Diagnosis not present

## 2018-11-21 DIAGNOSIS — H353221 Exudative age-related macular degeneration, left eye, with active choroidal neovascularization: Secondary | ICD-10-CM | POA: Diagnosis not present

## 2018-12-01 DIAGNOSIS — F0281 Dementia in other diseases classified elsewhere with behavioral disturbance: Secondary | ICD-10-CM | POA: Diagnosis not present

## 2018-12-01 DIAGNOSIS — G301 Alzheimer's disease with late onset: Secondary | ICD-10-CM | POA: Diagnosis not present

## 2018-12-01 DIAGNOSIS — H9193 Unspecified hearing loss, bilateral: Secondary | ICD-10-CM | POA: Diagnosis not present

## 2018-12-01 DIAGNOSIS — G629 Polyneuropathy, unspecified: Secondary | ICD-10-CM | POA: Diagnosis not present

## 2018-12-01 DIAGNOSIS — F0151 Vascular dementia with behavioral disturbance: Secondary | ICD-10-CM | POA: Diagnosis not present

## 2018-12-01 DIAGNOSIS — G4709 Other insomnia: Secondary | ICD-10-CM | POA: Diagnosis not present

## 2018-12-01 DIAGNOSIS — I6782 Cerebral ischemia: Secondary | ICD-10-CM | POA: Diagnosis not present

## 2018-12-01 DIAGNOSIS — H3530 Unspecified macular degeneration: Secondary | ICD-10-CM | POA: Diagnosis not present

## 2018-12-01 DIAGNOSIS — E782 Mixed hyperlipidemia: Secondary | ICD-10-CM | POA: Diagnosis not present

## 2018-12-01 DIAGNOSIS — I10 Essential (primary) hypertension: Secondary | ICD-10-CM | POA: Diagnosis not present

## 2018-12-01 DIAGNOSIS — R634 Abnormal weight loss: Secondary | ICD-10-CM | POA: Diagnosis not present

## 2018-12-01 DIAGNOSIS — F341 Dysthymic disorder: Secondary | ICD-10-CM | POA: Diagnosis not present

## 2018-12-04 DIAGNOSIS — F0390 Unspecified dementia without behavioral disturbance: Secondary | ICD-10-CM | POA: Diagnosis not present

## 2018-12-04 DIAGNOSIS — M25069 Hemarthrosis, unspecified knee: Secondary | ICD-10-CM | POA: Diagnosis not present

## 2018-12-04 DIAGNOSIS — Z719 Counseling, unspecified: Secondary | ICD-10-CM | POA: Diagnosis not present

## 2018-12-04 DIAGNOSIS — I82409 Acute embolism and thrombosis of unspecified deep veins of unspecified lower extremity: Secondary | ICD-10-CM | POA: Diagnosis not present

## 2018-12-04 DIAGNOSIS — R2681 Unsteadiness on feet: Secondary | ICD-10-CM | POA: Diagnosis not present

## 2018-12-04 DIAGNOSIS — N183 Chronic kidney disease, stage 3 (moderate): Secondary | ICD-10-CM | POA: Diagnosis not present

## 2018-12-04 DIAGNOSIS — M4800 Spinal stenosis, site unspecified: Secondary | ICD-10-CM | POA: Diagnosis not present

## 2018-12-18 DIAGNOSIS — K219 Gastro-esophageal reflux disease without esophagitis: Secondary | ICD-10-CM | POA: Diagnosis not present

## 2018-12-18 DIAGNOSIS — Z7901 Long term (current) use of anticoagulants: Secondary | ICD-10-CM | POA: Diagnosis not present

## 2018-12-18 DIAGNOSIS — M6281 Muscle weakness (generalized): Secondary | ICD-10-CM | POA: Diagnosis not present

## 2018-12-18 DIAGNOSIS — M791 Myalgia, unspecified site: Secondary | ICD-10-CM | POA: Diagnosis not present

## 2018-12-18 DIAGNOSIS — I10 Essential (primary) hypertension: Secondary | ICD-10-CM | POA: Diagnosis not present

## 2018-12-18 DIAGNOSIS — G629 Polyneuropathy, unspecified: Secondary | ICD-10-CM | POA: Diagnosis not present

## 2018-12-18 DIAGNOSIS — M25061 Hemarthrosis, right knee: Secondary | ICD-10-CM | POA: Diagnosis not present

## 2018-12-18 DIAGNOSIS — I82409 Acute embolism and thrombosis of unspecified deep veins of unspecified lower extremity: Secondary | ICD-10-CM | POA: Diagnosis not present

## 2018-12-18 DIAGNOSIS — F4323 Adjustment disorder with mixed anxiety and depressed mood: Secondary | ICD-10-CM | POA: Diagnosis not present

## 2018-12-18 DIAGNOSIS — H5461 Unqualified visual loss, right eye, normal vision left eye: Secondary | ICD-10-CM | POA: Diagnosis not present

## 2018-12-18 DIAGNOSIS — H9193 Unspecified hearing loss, bilateral: Secondary | ICD-10-CM | POA: Diagnosis not present

## 2018-12-18 DIAGNOSIS — M4800 Spinal stenosis, site unspecified: Secondary | ICD-10-CM | POA: Diagnosis not present

## 2018-12-18 DIAGNOSIS — R634 Abnormal weight loss: Secondary | ICD-10-CM | POA: Diagnosis not present

## 2018-12-18 DIAGNOSIS — F0390 Unspecified dementia without behavioral disturbance: Secondary | ICD-10-CM | POA: Diagnosis not present

## 2018-12-18 DIAGNOSIS — H3530 Unspecified macular degeneration: Secondary | ICD-10-CM | POA: Diagnosis not present

## 2018-12-18 DIAGNOSIS — Z6821 Body mass index (BMI) 21.0-21.9, adult: Secondary | ICD-10-CM | POA: Diagnosis not present

## 2018-12-18 DIAGNOSIS — I82502 Chronic embolism and thrombosis of unspecified deep veins of left lower extremity: Secondary | ICD-10-CM | POA: Diagnosis not present

## 2018-12-18 DIAGNOSIS — R262 Difficulty in walking, not elsewhere classified: Secondary | ICD-10-CM | POA: Diagnosis not present

## 2018-12-18 DIAGNOSIS — N401 Enlarged prostate with lower urinary tract symptoms: Secondary | ICD-10-CM | POA: Diagnosis not present

## 2018-12-18 DIAGNOSIS — Z9181 History of falling: Secondary | ICD-10-CM | POA: Diagnosis not present

## 2018-12-18 DIAGNOSIS — R350 Frequency of micturition: Secondary | ICD-10-CM | POA: Diagnosis not present

## 2018-12-18 DIAGNOSIS — G9009 Other idiopathic peripheral autonomic neuropathy: Secondary | ICD-10-CM | POA: Diagnosis not present

## 2019-01-13 DIAGNOSIS — H353221 Exudative age-related macular degeneration, left eye, with active choroidal neovascularization: Secondary | ICD-10-CM | POA: Diagnosis not present

## 2019-01-15 DIAGNOSIS — L84 Corns and callosities: Secondary | ICD-10-CM | POA: Diagnosis not present

## 2019-01-15 DIAGNOSIS — I739 Peripheral vascular disease, unspecified: Secondary | ICD-10-CM | POA: Diagnosis not present

## 2019-01-16 DIAGNOSIS — Z6821 Body mass index (BMI) 21.0-21.9, adult: Secondary | ICD-10-CM | POA: Diagnosis not present

## 2019-01-16 DIAGNOSIS — Z66 Do not resuscitate: Secondary | ICD-10-CM | POA: Diagnosis present

## 2019-01-16 DIAGNOSIS — I517 Cardiomegaly: Secondary | ICD-10-CM | POA: Diagnosis not present

## 2019-01-16 DIAGNOSIS — H3530 Unspecified macular degeneration: Secondary | ICD-10-CM | POA: Diagnosis not present

## 2019-01-16 DIAGNOSIS — G9009 Other idiopathic peripheral autonomic neuropathy: Secondary | ICD-10-CM | POA: Diagnosis not present

## 2019-01-16 DIAGNOSIS — Z86718 Personal history of other venous thrombosis and embolism: Secondary | ICD-10-CM | POA: Diagnosis not present

## 2019-01-16 DIAGNOSIS — R634 Abnormal weight loss: Secondary | ICD-10-CM | POA: Diagnosis not present

## 2019-01-16 DIAGNOSIS — R072 Precordial pain: Secondary | ICD-10-CM | POA: Diagnosis not present

## 2019-01-16 DIAGNOSIS — Z955 Presence of coronary angioplasty implant and graft: Secondary | ICD-10-CM | POA: Diagnosis not present

## 2019-01-16 DIAGNOSIS — I4891 Unspecified atrial fibrillation: Secondary | ICD-10-CM | POA: Diagnosis not present

## 2019-01-16 DIAGNOSIS — I2119 ST elevation (STEMI) myocardial infarction involving other coronary artery of inferior wall: Secondary | ICD-10-CM | POA: Diagnosis not present

## 2019-01-16 DIAGNOSIS — M150 Primary generalized (osteo)arthritis: Secondary | ICD-10-CM | POA: Diagnosis not present

## 2019-01-16 DIAGNOSIS — N401 Enlarged prostate with lower urinary tract symptoms: Secondary | ICD-10-CM | POA: Diagnosis not present

## 2019-01-16 DIAGNOSIS — R079 Chest pain, unspecified: Secondary | ICD-10-CM | POA: Diagnosis not present

## 2019-01-16 DIAGNOSIS — I480 Paroxysmal atrial fibrillation: Secondary | ICD-10-CM | POA: Diagnosis present

## 2019-01-16 DIAGNOSIS — D649 Anemia, unspecified: Secondary | ICD-10-CM | POA: Diagnosis not present

## 2019-01-16 DIAGNOSIS — I82401 Acute embolism and thrombosis of unspecified deep veins of right lower extremity: Secondary | ICD-10-CM | POA: Diagnosis not present

## 2019-01-16 DIAGNOSIS — F418 Other specified anxiety disorders: Secondary | ICD-10-CM | POA: Diagnosis not present

## 2019-01-16 DIAGNOSIS — K219 Gastro-esophageal reflux disease without esophagitis: Secondary | ICD-10-CM | POA: Diagnosis not present

## 2019-01-16 DIAGNOSIS — M25061 Hemarthrosis, right knee: Secondary | ICD-10-CM | POA: Diagnosis not present

## 2019-01-16 DIAGNOSIS — R350 Frequency of micturition: Secondary | ICD-10-CM | POA: Diagnosis not present

## 2019-01-16 DIAGNOSIS — R030 Elevated blood-pressure reading, without diagnosis of hypertension: Secondary | ICD-10-CM | POA: Diagnosis not present

## 2019-01-16 DIAGNOSIS — R739 Hyperglycemia, unspecified: Secondary | ICD-10-CM | POA: Diagnosis present

## 2019-01-16 DIAGNOSIS — N183 Chronic kidney disease, stage 3 (moderate): Secondary | ICD-10-CM | POA: Diagnosis not present

## 2019-01-16 DIAGNOSIS — Z9181 History of falling: Secondary | ICD-10-CM | POA: Diagnosis not present

## 2019-01-16 DIAGNOSIS — Z87891 Personal history of nicotine dependence: Secondary | ICD-10-CM | POA: Diagnosis not present

## 2019-01-16 DIAGNOSIS — M79603 Pain in arm, unspecified: Secondary | ICD-10-CM | POA: Diagnosis not present

## 2019-01-16 DIAGNOSIS — E785 Hyperlipidemia, unspecified: Secondary | ICD-10-CM | POA: Diagnosis not present

## 2019-01-16 DIAGNOSIS — M791 Myalgia, unspecified site: Secondary | ICD-10-CM | POA: Diagnosis not present

## 2019-01-16 DIAGNOSIS — Z7901 Long term (current) use of anticoagulants: Secondary | ICD-10-CM | POA: Diagnosis not present

## 2019-01-16 DIAGNOSIS — H9193 Unspecified hearing loss, bilateral: Secondary | ICD-10-CM | POA: Diagnosis not present

## 2019-01-16 DIAGNOSIS — H5461 Unqualified visual loss, right eye, normal vision left eye: Secondary | ICD-10-CM | POA: Diagnosis not present

## 2019-01-16 DIAGNOSIS — I219 Acute myocardial infarction, unspecified: Secondary | ICD-10-CM | POA: Diagnosis not present

## 2019-01-16 DIAGNOSIS — Z20828 Contact with and (suspected) exposure to other viral communicable diseases: Secondary | ICD-10-CM | POA: Diagnosis present

## 2019-01-16 DIAGNOSIS — F0390 Unspecified dementia without behavioral disturbance: Secondary | ICD-10-CM | POA: Diagnosis not present

## 2019-01-16 DIAGNOSIS — I129 Hypertensive chronic kidney disease with stage 1 through stage 4 chronic kidney disease, or unspecified chronic kidney disease: Secondary | ICD-10-CM | POA: Diagnosis not present

## 2019-01-16 DIAGNOSIS — I2510 Atherosclerotic heart disease of native coronary artery without angina pectoris: Secondary | ICD-10-CM | POA: Diagnosis present

## 2019-01-16 DIAGNOSIS — I2111 ST elevation (STEMI) myocardial infarction involving right coronary artery: Secondary | ICD-10-CM | POA: Diagnosis not present

## 2019-01-16 DIAGNOSIS — R0789 Other chest pain: Secondary | ICD-10-CM | POA: Diagnosis not present

## 2019-01-16 DIAGNOSIS — I25110 Atherosclerotic heart disease of native coronary artery with unstable angina pectoris: Secondary | ICD-10-CM | POA: Diagnosis not present

## 2019-01-22 DIAGNOSIS — F0390 Unspecified dementia without behavioral disturbance: Secondary | ICD-10-CM | POA: Diagnosis not present

## 2019-01-22 DIAGNOSIS — I129 Hypertensive chronic kidney disease with stage 1 through stage 4 chronic kidney disease, or unspecified chronic kidney disease: Secondary | ICD-10-CM | POA: Diagnosis not present

## 2019-01-22 DIAGNOSIS — N183 Chronic kidney disease, stage 3 (moderate): Secondary | ICD-10-CM | POA: Diagnosis not present

## 2019-01-22 DIAGNOSIS — I82401 Acute embolism and thrombosis of unspecified deep veins of right lower extremity: Secondary | ICD-10-CM | POA: Diagnosis not present

## 2019-01-22 DIAGNOSIS — M25061 Hemarthrosis, right knee: Secondary | ICD-10-CM | POA: Diagnosis not present

## 2019-01-22 DIAGNOSIS — G9009 Other idiopathic peripheral autonomic neuropathy: Secondary | ICD-10-CM | POA: Diagnosis not present

## 2019-01-27 DIAGNOSIS — F0390 Unspecified dementia without behavioral disturbance: Secondary | ICD-10-CM | POA: Diagnosis not present

## 2019-01-27 DIAGNOSIS — I82409 Acute embolism and thrombosis of unspecified deep veins of unspecified lower extremity: Secondary | ICD-10-CM | POA: Diagnosis not present

## 2019-01-27 DIAGNOSIS — G629 Polyneuropathy, unspecified: Secondary | ICD-10-CM | POA: Diagnosis not present

## 2019-01-27 DIAGNOSIS — Z6824 Body mass index (BMI) 24.0-24.9, adult: Secondary | ICD-10-CM | POA: Diagnosis not present

## 2019-01-27 DIAGNOSIS — Z008 Encounter for other general examination: Secondary | ICD-10-CM | POA: Diagnosis not present

## 2019-01-27 DIAGNOSIS — I2510 Atherosclerotic heart disease of native coronary artery without angina pectoris: Secondary | ICD-10-CM | POA: Diagnosis not present

## 2019-01-27 DIAGNOSIS — M4800 Spinal stenosis, site unspecified: Secondary | ICD-10-CM | POA: Diagnosis not present

## 2019-01-28 DIAGNOSIS — I129 Hypertensive chronic kidney disease with stage 1 through stage 4 chronic kidney disease, or unspecified chronic kidney disease: Secondary | ICD-10-CM | POA: Diagnosis not present

## 2019-01-28 DIAGNOSIS — M25061 Hemarthrosis, right knee: Secondary | ICD-10-CM | POA: Diagnosis not present

## 2019-01-28 DIAGNOSIS — F0390 Unspecified dementia without behavioral disturbance: Secondary | ICD-10-CM | POA: Diagnosis not present

## 2019-01-28 DIAGNOSIS — I82401 Acute embolism and thrombosis of unspecified deep veins of right lower extremity: Secondary | ICD-10-CM | POA: Diagnosis not present

## 2019-01-28 DIAGNOSIS — N183 Chronic kidney disease, stage 3 (moderate): Secondary | ICD-10-CM | POA: Diagnosis not present

## 2019-01-28 DIAGNOSIS — G9009 Other idiopathic peripheral autonomic neuropathy: Secondary | ICD-10-CM | POA: Diagnosis not present

## 2019-02-02 DIAGNOSIS — I480 Paroxysmal atrial fibrillation: Secondary | ICD-10-CM | POA: Diagnosis not present

## 2019-02-02 DIAGNOSIS — F0390 Unspecified dementia without behavioral disturbance: Secondary | ICD-10-CM | POA: Diagnosis not present

## 2019-02-02 DIAGNOSIS — E785 Hyperlipidemia, unspecified: Secondary | ICD-10-CM | POA: Diagnosis not present

## 2019-02-02 DIAGNOSIS — I2510 Atherosclerotic heart disease of native coronary artery without angina pectoris: Secondary | ICD-10-CM | POA: Diagnosis not present

## 2019-02-03 DIAGNOSIS — G9009 Other idiopathic peripheral autonomic neuropathy: Secondary | ICD-10-CM | POA: Diagnosis not present

## 2019-02-03 DIAGNOSIS — M25061 Hemarthrosis, right knee: Secondary | ICD-10-CM | POA: Diagnosis not present

## 2019-02-03 DIAGNOSIS — N183 Chronic kidney disease, stage 3 (moderate): Secondary | ICD-10-CM | POA: Diagnosis not present

## 2019-02-03 DIAGNOSIS — I129 Hypertensive chronic kidney disease with stage 1 through stage 4 chronic kidney disease, or unspecified chronic kidney disease: Secondary | ICD-10-CM | POA: Diagnosis not present

## 2019-02-03 DIAGNOSIS — I82401 Acute embolism and thrombosis of unspecified deep veins of right lower extremity: Secondary | ICD-10-CM | POA: Diagnosis not present

## 2019-02-03 DIAGNOSIS — F0390 Unspecified dementia without behavioral disturbance: Secondary | ICD-10-CM | POA: Diagnosis not present

## 2019-02-10 DIAGNOSIS — N183 Chronic kidney disease, stage 3 (moderate): Secondary | ICD-10-CM | POA: Diagnosis not present

## 2019-02-10 DIAGNOSIS — I129 Hypertensive chronic kidney disease with stage 1 through stage 4 chronic kidney disease, or unspecified chronic kidney disease: Secondary | ICD-10-CM | POA: Diagnosis not present

## 2019-02-10 DIAGNOSIS — F0390 Unspecified dementia without behavioral disturbance: Secondary | ICD-10-CM | POA: Diagnosis not present

## 2019-02-10 DIAGNOSIS — I82401 Acute embolism and thrombosis of unspecified deep veins of right lower extremity: Secondary | ICD-10-CM | POA: Diagnosis not present

## 2019-02-10 DIAGNOSIS — M25061 Hemarthrosis, right knee: Secondary | ICD-10-CM | POA: Diagnosis not present

## 2019-02-10 DIAGNOSIS — G9009 Other idiopathic peripheral autonomic neuropathy: Secondary | ICD-10-CM | POA: Diagnosis not present

## 2019-02-16 DIAGNOSIS — H5461 Unqualified visual loss, right eye, normal vision left eye: Secondary | ICD-10-CM | POA: Diagnosis not present

## 2019-02-16 DIAGNOSIS — E785 Hyperlipidemia, unspecified: Secondary | ICD-10-CM | POA: Diagnosis not present

## 2019-02-16 DIAGNOSIS — F0390 Unspecified dementia without behavioral disturbance: Secondary | ICD-10-CM | POA: Diagnosis not present

## 2019-02-16 DIAGNOSIS — R634 Abnormal weight loss: Secondary | ICD-10-CM | POA: Diagnosis not present

## 2019-02-16 DIAGNOSIS — N183 Chronic kidney disease, stage 3 (moderate): Secondary | ICD-10-CM | POA: Diagnosis not present

## 2019-02-16 DIAGNOSIS — N401 Enlarged prostate with lower urinary tract symptoms: Secondary | ICD-10-CM | POA: Diagnosis not present

## 2019-02-16 DIAGNOSIS — H9193 Unspecified hearing loss, bilateral: Secondary | ICD-10-CM | POA: Diagnosis not present

## 2019-02-16 DIAGNOSIS — M791 Myalgia, unspecified site: Secondary | ICD-10-CM | POA: Diagnosis not present

## 2019-02-16 DIAGNOSIS — Z7901 Long term (current) use of anticoagulants: Secondary | ICD-10-CM | POA: Diagnosis not present

## 2019-02-16 DIAGNOSIS — M150 Primary generalized (osteo)arthritis: Secondary | ICD-10-CM | POA: Diagnosis not present

## 2019-02-16 DIAGNOSIS — I82401 Acute embolism and thrombosis of unspecified deep veins of right lower extremity: Secondary | ICD-10-CM | POA: Diagnosis not present

## 2019-02-16 DIAGNOSIS — Z9181 History of falling: Secondary | ICD-10-CM | POA: Diagnosis not present

## 2019-02-16 DIAGNOSIS — G9009 Other idiopathic peripheral autonomic neuropathy: Secondary | ICD-10-CM | POA: Diagnosis not present

## 2019-02-16 DIAGNOSIS — H3530 Unspecified macular degeneration: Secondary | ICD-10-CM | POA: Diagnosis not present

## 2019-02-16 DIAGNOSIS — R350 Frequency of micturition: Secondary | ICD-10-CM | POA: Diagnosis not present

## 2019-02-16 DIAGNOSIS — F418 Other specified anxiety disorders: Secondary | ICD-10-CM | POA: Diagnosis not present

## 2019-02-16 DIAGNOSIS — I2510 Atherosclerotic heart disease of native coronary artery without angina pectoris: Secondary | ICD-10-CM | POA: Diagnosis not present

## 2019-02-16 DIAGNOSIS — I129 Hypertensive chronic kidney disease with stage 1 through stage 4 chronic kidney disease, or unspecified chronic kidney disease: Secondary | ICD-10-CM | POA: Diagnosis not present

## 2019-02-16 DIAGNOSIS — Z6821 Body mass index (BMI) 21.0-21.9, adult: Secondary | ICD-10-CM | POA: Diagnosis not present

## 2019-02-16 DIAGNOSIS — M25061 Hemarthrosis, right knee: Secondary | ICD-10-CM | POA: Diagnosis not present

## 2019-02-16 DIAGNOSIS — K219 Gastro-esophageal reflux disease without esophagitis: Secondary | ICD-10-CM | POA: Diagnosis not present

## 2019-02-23 DIAGNOSIS — I129 Hypertensive chronic kidney disease with stage 1 through stage 4 chronic kidney disease, or unspecified chronic kidney disease: Secondary | ICD-10-CM | POA: Diagnosis not present

## 2019-02-23 DIAGNOSIS — N183 Chronic kidney disease, stage 3 (moderate): Secondary | ICD-10-CM | POA: Diagnosis not present

## 2019-02-23 DIAGNOSIS — G9009 Other idiopathic peripheral autonomic neuropathy: Secondary | ICD-10-CM | POA: Diagnosis not present

## 2019-02-23 DIAGNOSIS — I82401 Acute embolism and thrombosis of unspecified deep veins of right lower extremity: Secondary | ICD-10-CM | POA: Diagnosis not present

## 2019-02-23 DIAGNOSIS — F0390 Unspecified dementia without behavioral disturbance: Secondary | ICD-10-CM | POA: Diagnosis not present

## 2019-02-23 DIAGNOSIS — M25061 Hemarthrosis, right knee: Secondary | ICD-10-CM | POA: Diagnosis not present

## 2019-03-03 DIAGNOSIS — I82401 Acute embolism and thrombosis of unspecified deep veins of right lower extremity: Secondary | ICD-10-CM | POA: Diagnosis not present

## 2019-03-03 DIAGNOSIS — I129 Hypertensive chronic kidney disease with stage 1 through stage 4 chronic kidney disease, or unspecified chronic kidney disease: Secondary | ICD-10-CM | POA: Diagnosis not present

## 2019-03-03 DIAGNOSIS — N183 Chronic kidney disease, stage 3 (moderate): Secondary | ICD-10-CM | POA: Diagnosis not present

## 2019-03-03 DIAGNOSIS — G9009 Other idiopathic peripheral autonomic neuropathy: Secondary | ICD-10-CM | POA: Diagnosis not present

## 2019-03-03 DIAGNOSIS — M25061 Hemarthrosis, right knee: Secondary | ICD-10-CM | POA: Diagnosis not present

## 2019-03-03 DIAGNOSIS — F0390 Unspecified dementia without behavioral disturbance: Secondary | ICD-10-CM | POA: Diagnosis not present

## 2019-03-04 DIAGNOSIS — F0390 Unspecified dementia without behavioral disturbance: Secondary | ICD-10-CM | POA: Diagnosis not present

## 2019-03-04 DIAGNOSIS — M25061 Hemarthrosis, right knee: Secondary | ICD-10-CM | POA: Diagnosis not present

## 2019-03-04 DIAGNOSIS — I129 Hypertensive chronic kidney disease with stage 1 through stage 4 chronic kidney disease, or unspecified chronic kidney disease: Secondary | ICD-10-CM | POA: Diagnosis not present

## 2019-03-04 DIAGNOSIS — G9009 Other idiopathic peripheral autonomic neuropathy: Secondary | ICD-10-CM | POA: Diagnosis not present

## 2019-03-04 DIAGNOSIS — I82401 Acute embolism and thrombosis of unspecified deep veins of right lower extremity: Secondary | ICD-10-CM | POA: Diagnosis not present

## 2019-03-04 DIAGNOSIS — N183 Chronic kidney disease, stage 3 (moderate): Secondary | ICD-10-CM | POA: Diagnosis not present

## 2019-03-05 DIAGNOSIS — H3530 Unspecified macular degeneration: Secondary | ICD-10-CM | POA: Diagnosis not present

## 2019-03-05 DIAGNOSIS — G301 Alzheimer's disease with late onset: Secondary | ICD-10-CM | POA: Diagnosis not present

## 2019-03-05 DIAGNOSIS — H9193 Unspecified hearing loss, bilateral: Secondary | ICD-10-CM | POA: Diagnosis not present

## 2019-03-05 DIAGNOSIS — F0281 Dementia in other diseases classified elsewhere with behavioral disturbance: Secondary | ICD-10-CM | POA: Diagnosis not present

## 2019-03-05 DIAGNOSIS — F341 Dysthymic disorder: Secondary | ICD-10-CM | POA: Diagnosis not present

## 2019-03-05 DIAGNOSIS — I10 Essential (primary) hypertension: Secondary | ICD-10-CM | POA: Diagnosis not present

## 2019-03-05 DIAGNOSIS — E782 Mixed hyperlipidemia: Secondary | ICD-10-CM | POA: Diagnosis not present

## 2019-03-05 DIAGNOSIS — F0151 Vascular dementia with behavioral disturbance: Secondary | ICD-10-CM | POA: Diagnosis not present

## 2019-03-05 DIAGNOSIS — G629 Polyneuropathy, unspecified: Secondary | ICD-10-CM | POA: Diagnosis not present

## 2019-03-05 DIAGNOSIS — R197 Diarrhea, unspecified: Secondary | ICD-10-CM | POA: Diagnosis not present

## 2019-03-05 DIAGNOSIS — I6782 Cerebral ischemia: Secondary | ICD-10-CM | POA: Diagnosis not present

## 2019-03-05 DIAGNOSIS — I2510 Atherosclerotic heart disease of native coronary artery without angina pectoris: Secondary | ICD-10-CM | POA: Diagnosis not present

## 2019-03-06 DIAGNOSIS — Z23 Encounter for immunization: Secondary | ICD-10-CM | POA: Diagnosis not present

## 2019-03-09 DIAGNOSIS — I82401 Acute embolism and thrombosis of unspecified deep veins of right lower extremity: Secondary | ICD-10-CM | POA: Diagnosis not present

## 2019-03-09 DIAGNOSIS — I129 Hypertensive chronic kidney disease with stage 1 through stage 4 chronic kidney disease, or unspecified chronic kidney disease: Secondary | ICD-10-CM | POA: Diagnosis not present

## 2019-03-09 DIAGNOSIS — F0390 Unspecified dementia without behavioral disturbance: Secondary | ICD-10-CM | POA: Diagnosis not present

## 2019-03-09 DIAGNOSIS — M25061 Hemarthrosis, right knee: Secondary | ICD-10-CM | POA: Diagnosis not present

## 2019-03-09 DIAGNOSIS — N183 Chronic kidney disease, stage 3 (moderate): Secondary | ICD-10-CM | POA: Diagnosis not present

## 2019-03-09 DIAGNOSIS — G9009 Other idiopathic peripheral autonomic neuropathy: Secondary | ICD-10-CM | POA: Diagnosis not present

## 2019-03-10 DIAGNOSIS — I129 Hypertensive chronic kidney disease with stage 1 through stage 4 chronic kidney disease, or unspecified chronic kidney disease: Secondary | ICD-10-CM | POA: Diagnosis not present

## 2019-03-10 DIAGNOSIS — H353221 Exudative age-related macular degeneration, left eye, with active choroidal neovascularization: Secondary | ICD-10-CM | POA: Diagnosis not present

## 2019-03-10 DIAGNOSIS — I82401 Acute embolism and thrombosis of unspecified deep veins of right lower extremity: Secondary | ICD-10-CM | POA: Diagnosis not present

## 2019-03-10 DIAGNOSIS — F0390 Unspecified dementia without behavioral disturbance: Secondary | ICD-10-CM | POA: Diagnosis not present

## 2019-03-10 DIAGNOSIS — G9009 Other idiopathic peripheral autonomic neuropathy: Secondary | ICD-10-CM | POA: Diagnosis not present

## 2019-03-10 DIAGNOSIS — N183 Chronic kidney disease, stage 3 (moderate): Secondary | ICD-10-CM | POA: Diagnosis not present

## 2019-03-10 DIAGNOSIS — M25061 Hemarthrosis, right knee: Secondary | ICD-10-CM | POA: Diagnosis not present

## 2019-03-13 DIAGNOSIS — F0390 Unspecified dementia without behavioral disturbance: Secondary | ICD-10-CM | POA: Diagnosis not present

## 2019-03-13 DIAGNOSIS — I129 Hypertensive chronic kidney disease with stage 1 through stage 4 chronic kidney disease, or unspecified chronic kidney disease: Secondary | ICD-10-CM | POA: Diagnosis not present

## 2019-03-13 DIAGNOSIS — N183 Chronic kidney disease, stage 3 (moderate): Secondary | ICD-10-CM | POA: Diagnosis not present

## 2019-03-13 DIAGNOSIS — G9009 Other idiopathic peripheral autonomic neuropathy: Secondary | ICD-10-CM | POA: Diagnosis not present

## 2019-03-13 DIAGNOSIS — M25061 Hemarthrosis, right knee: Secondary | ICD-10-CM | POA: Diagnosis not present

## 2019-03-13 DIAGNOSIS — I82401 Acute embolism and thrombosis of unspecified deep veins of right lower extremity: Secondary | ICD-10-CM | POA: Diagnosis not present

## 2019-03-16 DIAGNOSIS — F0390 Unspecified dementia without behavioral disturbance: Secondary | ICD-10-CM | POA: Diagnosis not present

## 2019-03-16 DIAGNOSIS — I129 Hypertensive chronic kidney disease with stage 1 through stage 4 chronic kidney disease, or unspecified chronic kidney disease: Secondary | ICD-10-CM | POA: Diagnosis not present

## 2019-03-16 DIAGNOSIS — I82401 Acute embolism and thrombosis of unspecified deep veins of right lower extremity: Secondary | ICD-10-CM | POA: Diagnosis not present

## 2019-03-16 DIAGNOSIS — M25061 Hemarthrosis, right knee: Secondary | ICD-10-CM | POA: Diagnosis not present

## 2019-03-16 DIAGNOSIS — G9009 Other idiopathic peripheral autonomic neuropathy: Secondary | ICD-10-CM | POA: Diagnosis not present

## 2019-03-16 DIAGNOSIS — N183 Chronic kidney disease, stage 3 (moderate): Secondary | ICD-10-CM | POA: Diagnosis not present

## 2019-03-17 DIAGNOSIS — I129 Hypertensive chronic kidney disease with stage 1 through stage 4 chronic kidney disease, or unspecified chronic kidney disease: Secondary | ICD-10-CM | POA: Diagnosis not present

## 2019-03-17 DIAGNOSIS — G9009 Other idiopathic peripheral autonomic neuropathy: Secondary | ICD-10-CM | POA: Diagnosis not present

## 2019-03-17 DIAGNOSIS — F0390 Unspecified dementia without behavioral disturbance: Secondary | ICD-10-CM | POA: Diagnosis not present

## 2019-03-17 DIAGNOSIS — I82401 Acute embolism and thrombosis of unspecified deep veins of right lower extremity: Secondary | ICD-10-CM | POA: Diagnosis not present

## 2019-03-17 DIAGNOSIS — N183 Chronic kidney disease, stage 3 (moderate): Secondary | ICD-10-CM | POA: Diagnosis not present

## 2019-03-17 DIAGNOSIS — I739 Peripheral vascular disease, unspecified: Secondary | ICD-10-CM | POA: Diagnosis not present

## 2019-03-17 DIAGNOSIS — M25061 Hemarthrosis, right knee: Secondary | ICD-10-CM | POA: Diagnosis not present

## 2019-03-17 DIAGNOSIS — L84 Corns and callosities: Secondary | ICD-10-CM | POA: Diagnosis not present

## 2019-03-18 DIAGNOSIS — I2510 Atherosclerotic heart disease of native coronary artery without angina pectoris: Secondary | ICD-10-CM | POA: Diagnosis not present

## 2019-03-18 DIAGNOSIS — E785 Hyperlipidemia, unspecified: Secondary | ICD-10-CM | POA: Diagnosis not present

## 2019-03-18 DIAGNOSIS — H5461 Unqualified visual loss, right eye, normal vision left eye: Secondary | ICD-10-CM | POA: Diagnosis not present

## 2019-03-18 DIAGNOSIS — R350 Frequency of micturition: Secondary | ICD-10-CM | POA: Diagnosis not present

## 2019-03-18 DIAGNOSIS — M791 Myalgia, unspecified site: Secondary | ICD-10-CM | POA: Diagnosis not present

## 2019-03-18 DIAGNOSIS — G9009 Other idiopathic peripheral autonomic neuropathy: Secondary | ICD-10-CM | POA: Diagnosis not present

## 2019-03-18 DIAGNOSIS — R262 Difficulty in walking, not elsewhere classified: Secondary | ICD-10-CM | POA: Diagnosis not present

## 2019-03-18 DIAGNOSIS — I129 Hypertensive chronic kidney disease with stage 1 through stage 4 chronic kidney disease, or unspecified chronic kidney disease: Secondary | ICD-10-CM | POA: Diagnosis not present

## 2019-03-18 DIAGNOSIS — M150 Primary generalized (osteo)arthritis: Secondary | ICD-10-CM | POA: Diagnosis not present

## 2019-03-18 DIAGNOSIS — Z7901 Long term (current) use of anticoagulants: Secondary | ICD-10-CM | POA: Diagnosis not present

## 2019-03-18 DIAGNOSIS — F418 Other specified anxiety disorders: Secondary | ICD-10-CM | POA: Diagnosis not present

## 2019-03-18 DIAGNOSIS — K219 Gastro-esophageal reflux disease without esophagitis: Secondary | ICD-10-CM | POA: Diagnosis not present

## 2019-03-18 DIAGNOSIS — R634 Abnormal weight loss: Secondary | ICD-10-CM | POA: Diagnosis not present

## 2019-03-18 DIAGNOSIS — N401 Enlarged prostate with lower urinary tract symptoms: Secondary | ICD-10-CM | POA: Diagnosis not present

## 2019-03-18 DIAGNOSIS — H9193 Unspecified hearing loss, bilateral: Secondary | ICD-10-CM | POA: Diagnosis not present

## 2019-03-18 DIAGNOSIS — N1831 Chronic kidney disease, stage 3a: Secondary | ICD-10-CM | POA: Diagnosis not present

## 2019-03-18 DIAGNOSIS — Z9181 History of falling: Secondary | ICD-10-CM | POA: Diagnosis not present

## 2019-03-18 DIAGNOSIS — M6281 Muscle weakness (generalized): Secondary | ICD-10-CM | POA: Diagnosis not present

## 2019-03-18 DIAGNOSIS — F0390 Unspecified dementia without behavioral disturbance: Secondary | ICD-10-CM | POA: Diagnosis not present

## 2019-03-18 DIAGNOSIS — H3530 Unspecified macular degeneration: Secondary | ICD-10-CM | POA: Diagnosis not present

## 2019-03-18 DIAGNOSIS — M25061 Hemarthrosis, right knee: Secondary | ICD-10-CM | POA: Diagnosis not present

## 2019-03-18 DIAGNOSIS — Z6821 Body mass index (BMI) 21.0-21.9, adult: Secondary | ICD-10-CM | POA: Diagnosis not present

## 2019-03-19 DIAGNOSIS — N1831 Chronic kidney disease, stage 3a: Secondary | ICD-10-CM | POA: Diagnosis not present

## 2019-03-19 DIAGNOSIS — M25061 Hemarthrosis, right knee: Secondary | ICD-10-CM | POA: Diagnosis not present

## 2019-03-19 DIAGNOSIS — M791 Myalgia, unspecified site: Secondary | ICD-10-CM | POA: Diagnosis not present

## 2019-03-19 DIAGNOSIS — I129 Hypertensive chronic kidney disease with stage 1 through stage 4 chronic kidney disease, or unspecified chronic kidney disease: Secondary | ICD-10-CM | POA: Diagnosis not present

## 2019-03-19 DIAGNOSIS — G9009 Other idiopathic peripheral autonomic neuropathy: Secondary | ICD-10-CM | POA: Diagnosis not present

## 2019-03-19 DIAGNOSIS — F0390 Unspecified dementia without behavioral disturbance: Secondary | ICD-10-CM | POA: Diagnosis not present

## 2019-03-24 DIAGNOSIS — M791 Myalgia, unspecified site: Secondary | ICD-10-CM | POA: Diagnosis not present

## 2019-03-24 DIAGNOSIS — F0390 Unspecified dementia without behavioral disturbance: Secondary | ICD-10-CM | POA: Diagnosis not present

## 2019-03-24 DIAGNOSIS — G9009 Other idiopathic peripheral autonomic neuropathy: Secondary | ICD-10-CM | POA: Diagnosis not present

## 2019-03-24 DIAGNOSIS — M25061 Hemarthrosis, right knee: Secondary | ICD-10-CM | POA: Diagnosis not present

## 2019-03-24 DIAGNOSIS — I129 Hypertensive chronic kidney disease with stage 1 through stage 4 chronic kidney disease, or unspecified chronic kidney disease: Secondary | ICD-10-CM | POA: Diagnosis not present

## 2019-03-24 DIAGNOSIS — N1831 Chronic kidney disease, stage 3a: Secondary | ICD-10-CM | POA: Diagnosis not present

## 2019-03-24 IMAGING — DX DG CERVICAL SPINE 2 OR 3 VIEWS
4 series · 4 of 4 positions shown · non-contrast
Comparison: None.

CLINICAL DATA: Right-sided neck pain. Please note, the patient
could not tolerate optimal positioning. The best possible images
were obtained.

EXAM:
CERVICAL SPINE - 2-3 VIEW

[c-spine lat]
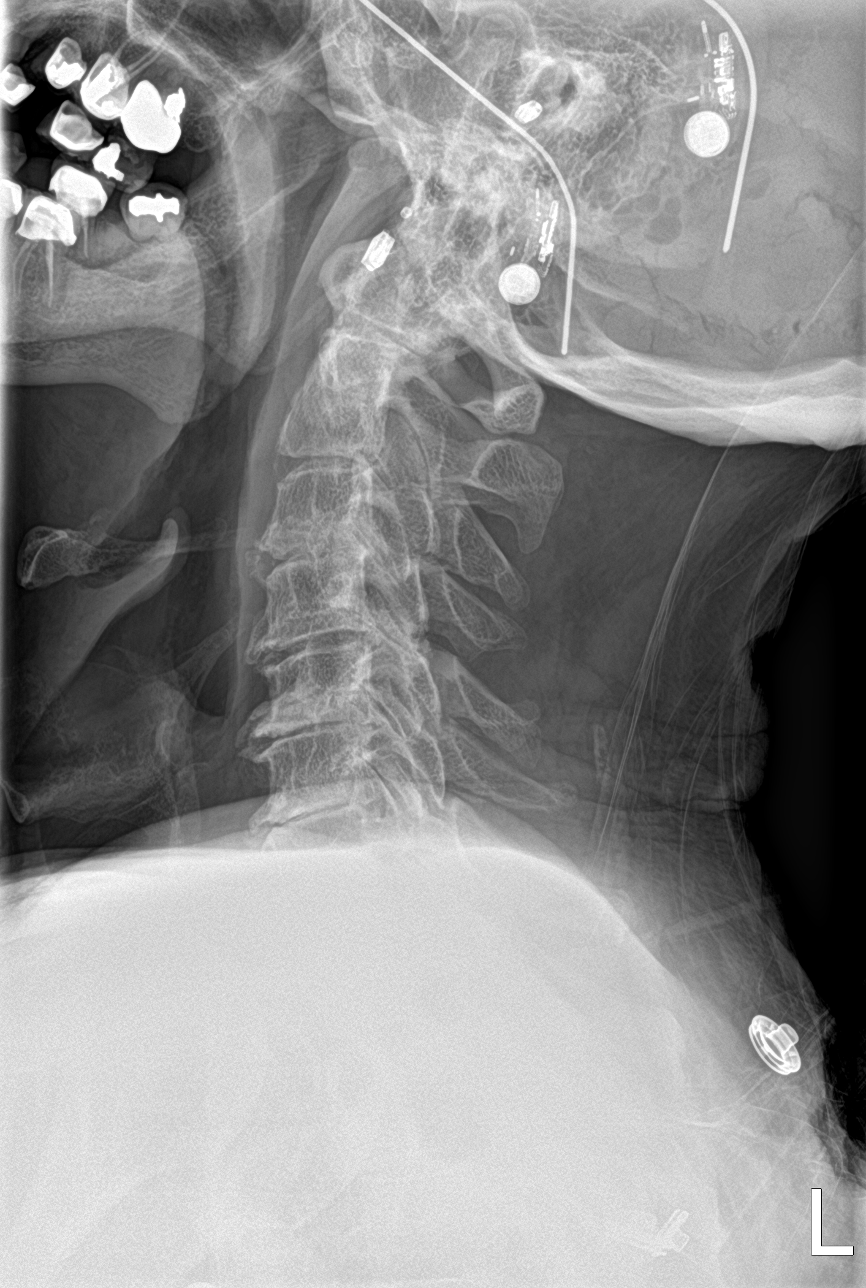

[c-spine ap]
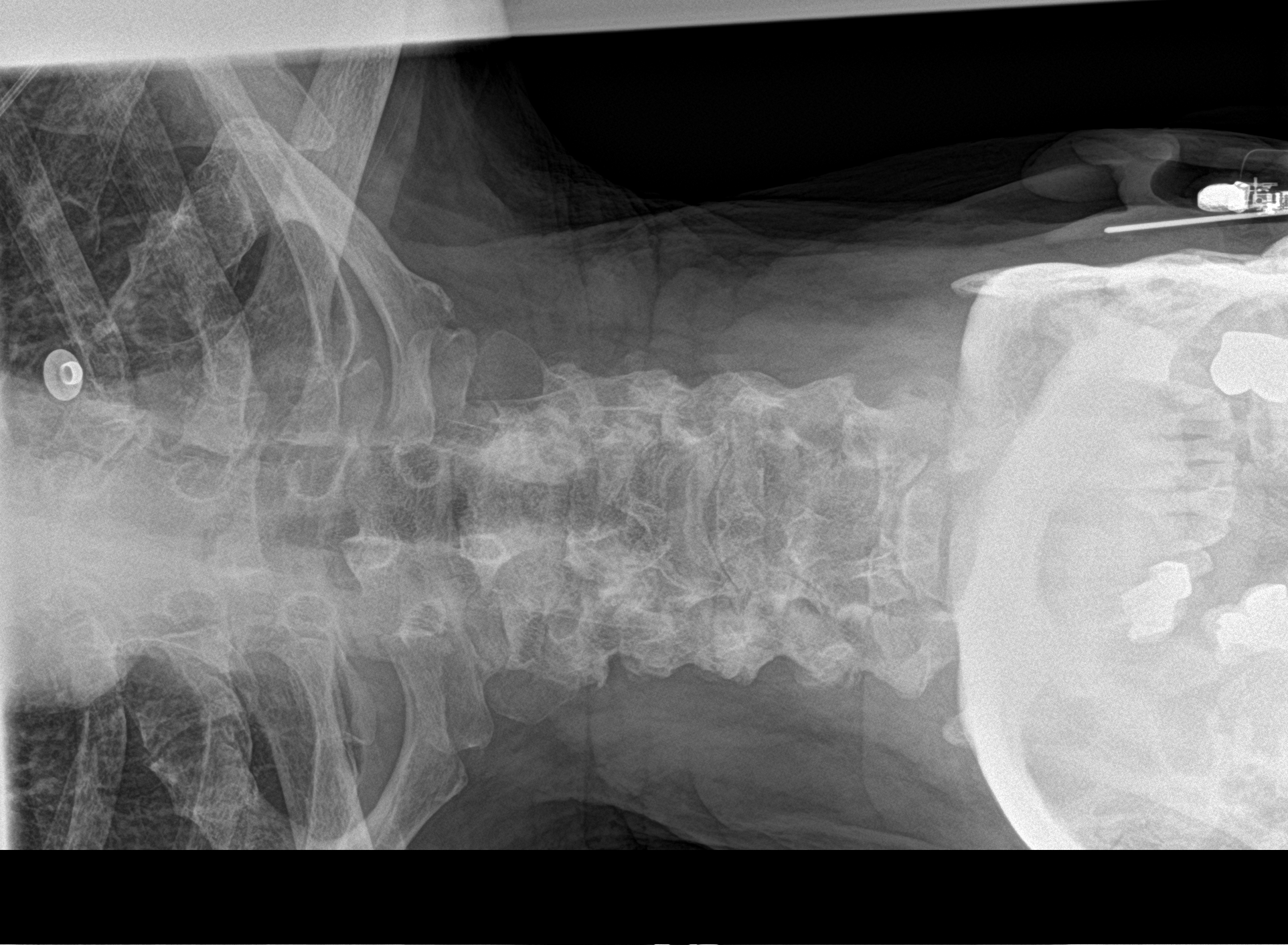

[c-spine open mouth]
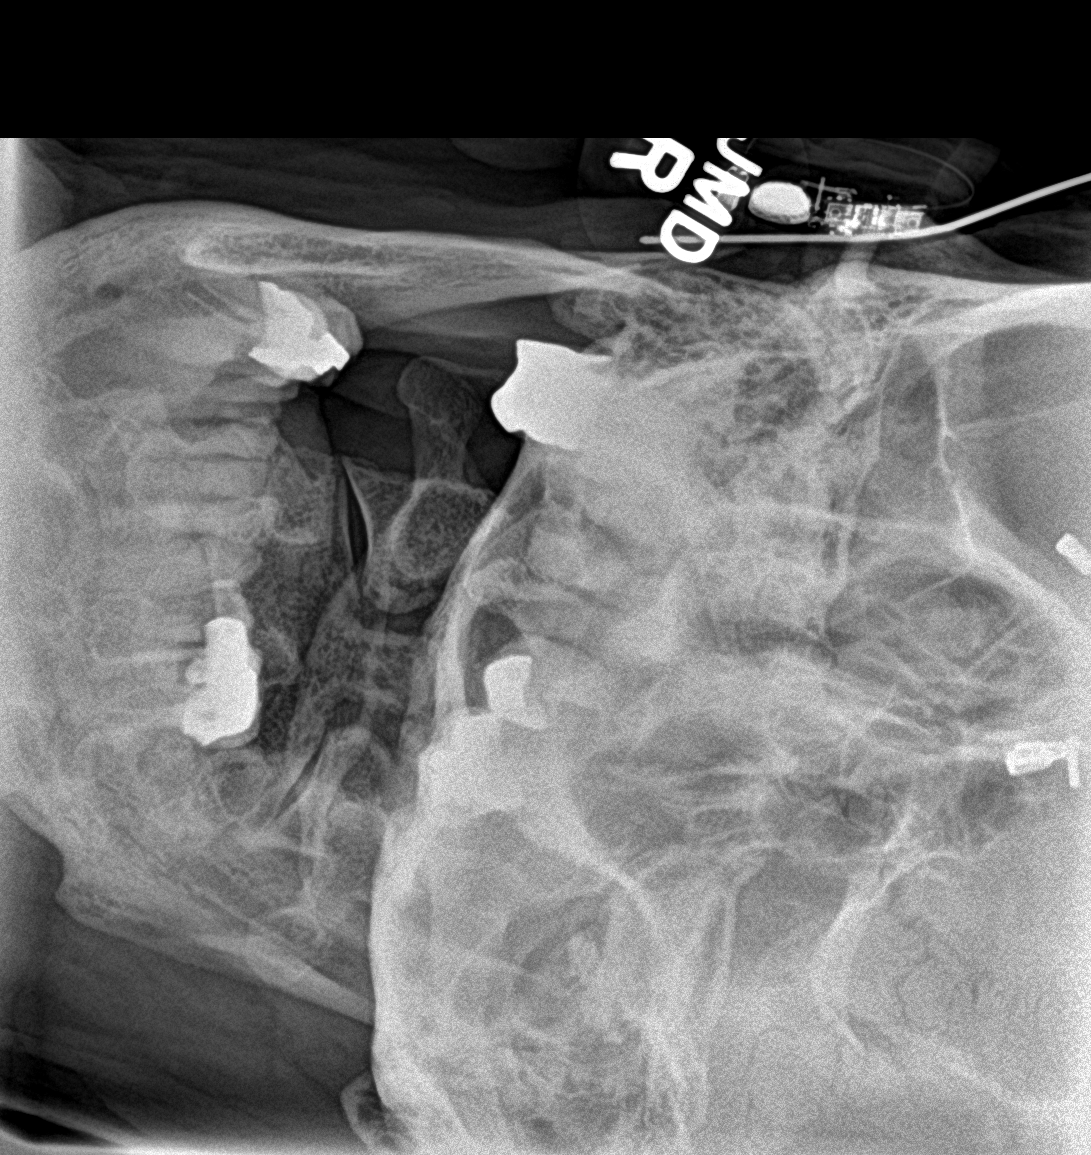

[c-spine swimmers]
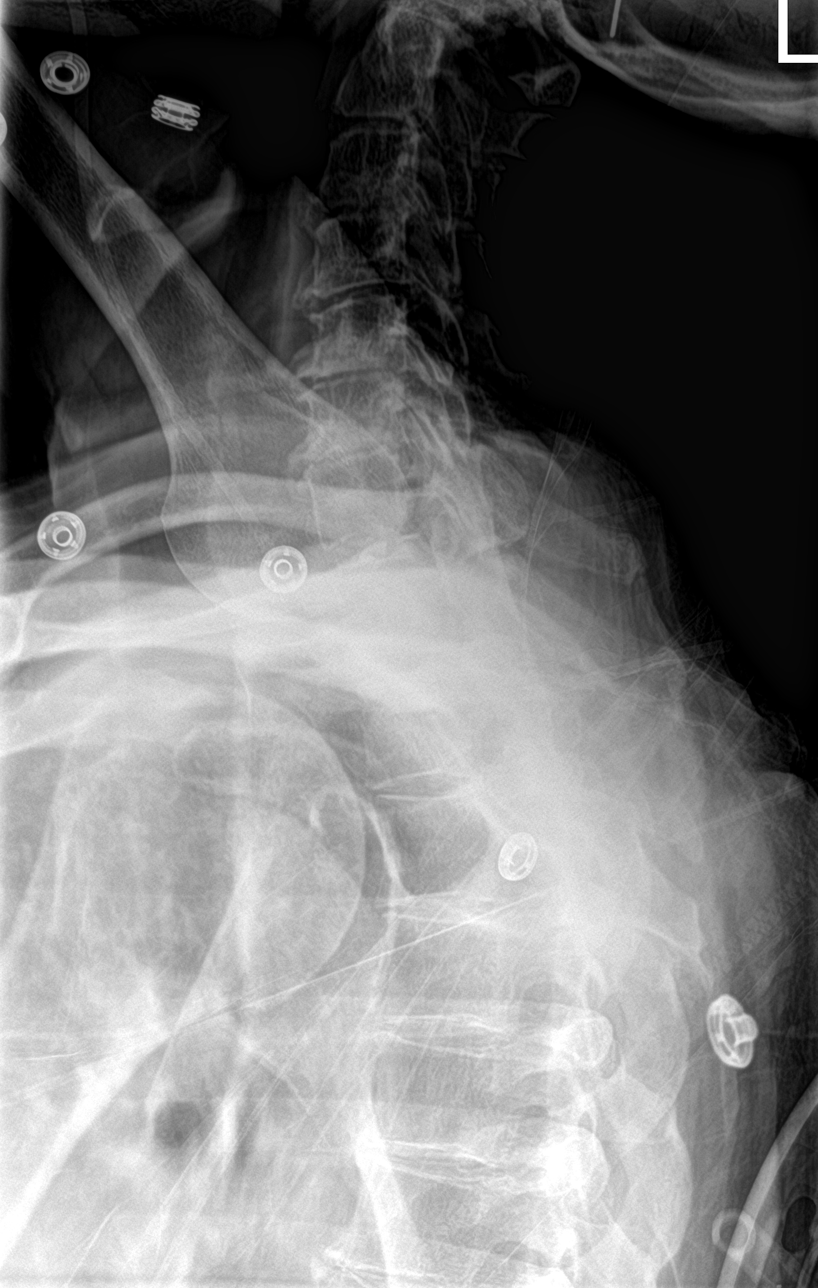

[4 of 4 positions shown; findings below may reference images not displayed]

FINDINGS: There is no evidence of cervical spine fracture or prevertebral soft
tissue swelling. Alignment is normal. Moderate degenerative changes
are noted.

No other significant bone abnormalities are identified.
IMPRESSION: Limited evaluation secondary to patient positioning. Within these
limitations, no acute fracture or prevertebral soft tissue swelling
is identified. If there is a history of trauma raising the concern
for fracture, noncontrast CT of the C-spine would be the study of
choice.

## 2019-03-26 DIAGNOSIS — I10 Essential (primary) hypertension: Secondary | ICD-10-CM | POA: Diagnosis not present

## 2019-03-26 DIAGNOSIS — M4800 Spinal stenosis, site unspecified: Secondary | ICD-10-CM | POA: Diagnosis not present

## 2019-03-26 DIAGNOSIS — I2510 Atherosclerotic heart disease of native coronary artery without angina pectoris: Secondary | ICD-10-CM | POA: Diagnosis not present

## 2019-03-26 DIAGNOSIS — F0390 Unspecified dementia without behavioral disturbance: Secondary | ICD-10-CM | POA: Diagnosis not present

## 2019-03-26 DIAGNOSIS — I82409 Acute embolism and thrombosis of unspecified deep veins of unspecified lower extremity: Secondary | ICD-10-CM | POA: Diagnosis not present

## 2019-04-01 DIAGNOSIS — M4800 Spinal stenosis, site unspecified: Secondary | ICD-10-CM | POA: Diagnosis not present

## 2019-04-01 DIAGNOSIS — I82409 Acute embolism and thrombosis of unspecified deep veins of unspecified lower extremity: Secondary | ICD-10-CM | POA: Diagnosis not present

## 2019-04-01 DIAGNOSIS — I10 Essential (primary) hypertension: Secondary | ICD-10-CM | POA: Diagnosis not present

## 2019-04-01 DIAGNOSIS — R7301 Impaired fasting glucose: Secondary | ICD-10-CM | POA: Diagnosis not present

## 2019-04-01 DIAGNOSIS — Z111 Encounter for screening for respiratory tuberculosis: Secondary | ICD-10-CM | POA: Diagnosis not present

## 2019-04-01 DIAGNOSIS — E785 Hyperlipidemia, unspecified: Secondary | ICD-10-CM | POA: Diagnosis not present

## 2019-04-01 DIAGNOSIS — F0390 Unspecified dementia without behavioral disturbance: Secondary | ICD-10-CM | POA: Diagnosis not present

## 2019-04-01 DIAGNOSIS — I2510 Atherosclerotic heart disease of native coronary artery without angina pectoris: Secondary | ICD-10-CM | POA: Diagnosis not present

## 2019-04-01 DIAGNOSIS — Z1331 Encounter for screening for depression: Secondary | ICD-10-CM | POA: Diagnosis not present

## 2019-04-01 DIAGNOSIS — Z0000 Encounter for general adult medical examination without abnormal findings: Secondary | ICD-10-CM | POA: Diagnosis not present

## 2019-04-01 DIAGNOSIS — Z79899 Other long term (current) drug therapy: Secondary | ICD-10-CM | POA: Diagnosis not present

## 2019-04-01 DIAGNOSIS — Z6824 Body mass index (BMI) 24.0-24.9, adult: Secondary | ICD-10-CM | POA: Diagnosis not present

## 2019-04-07 DIAGNOSIS — Z79899 Other long term (current) drug therapy: Secondary | ICD-10-CM | POA: Diagnosis not present

## 2019-04-08 DIAGNOSIS — Z7902 Long term (current) use of antithrombotics/antiplatelets: Secondary | ICD-10-CM | POA: Diagnosis not present

## 2019-04-08 DIAGNOSIS — D518 Other vitamin B12 deficiency anemias: Secondary | ICD-10-CM | POA: Diagnosis not present

## 2019-04-08 DIAGNOSIS — F329 Major depressive disorder, single episode, unspecified: Secondary | ICD-10-CM | POA: Diagnosis not present

## 2019-04-08 DIAGNOSIS — N400 Enlarged prostate without lower urinary tract symptoms: Secondary | ICD-10-CM | POA: Diagnosis not present

## 2019-04-08 DIAGNOSIS — F419 Anxiety disorder, unspecified: Secondary | ICD-10-CM | POA: Diagnosis not present

## 2019-04-08 DIAGNOSIS — G9009 Other idiopathic peripheral autonomic neuropathy: Secondary | ICD-10-CM | POA: Diagnosis not present

## 2019-04-08 DIAGNOSIS — K219 Gastro-esophageal reflux disease without esophagitis: Secondary | ICD-10-CM | POA: Diagnosis not present

## 2019-04-08 DIAGNOSIS — I2510 Atherosclerotic heart disease of native coronary artery without angina pectoris: Secondary | ICD-10-CM | POA: Diagnosis not present

## 2019-04-08 DIAGNOSIS — R7309 Other abnormal glucose: Secondary | ICD-10-CM | POA: Diagnosis not present

## 2019-04-08 DIAGNOSIS — I129 Hypertensive chronic kidney disease with stage 1 through stage 4 chronic kidney disease, or unspecified chronic kidney disease: Secondary | ICD-10-CM | POA: Diagnosis not present

## 2019-04-08 DIAGNOSIS — F5109 Other insomnia not due to a substance or known physiological condition: Secondary | ICD-10-CM | POA: Diagnosis not present

## 2019-04-08 DIAGNOSIS — F0390 Unspecified dementia without behavioral disturbance: Secondary | ICD-10-CM | POA: Diagnosis not present

## 2019-04-10 DIAGNOSIS — I2510 Atherosclerotic heart disease of native coronary artery without angina pectoris: Secondary | ICD-10-CM | POA: Diagnosis not present

## 2019-04-15 DIAGNOSIS — F0390 Unspecified dementia without behavioral disturbance: Secondary | ICD-10-CM | POA: Diagnosis not present

## 2019-04-15 DIAGNOSIS — M25061 Hemarthrosis, right knee: Secondary | ICD-10-CM | POA: Diagnosis not present

## 2019-04-15 DIAGNOSIS — G9009 Other idiopathic peripheral autonomic neuropathy: Secondary | ICD-10-CM | POA: Diagnosis not present

## 2019-04-15 DIAGNOSIS — N1831 Chronic kidney disease, stage 3a: Secondary | ICD-10-CM | POA: Diagnosis not present

## 2019-04-15 DIAGNOSIS — M791 Myalgia, unspecified site: Secondary | ICD-10-CM | POA: Diagnosis not present

## 2019-04-15 DIAGNOSIS — I129 Hypertensive chronic kidney disease with stage 1 through stage 4 chronic kidney disease, or unspecified chronic kidney disease: Secondary | ICD-10-CM | POA: Diagnosis not present

## 2019-04-17 DIAGNOSIS — H5461 Unqualified visual loss, right eye, normal vision left eye: Secondary | ICD-10-CM | POA: Diagnosis not present

## 2019-04-17 DIAGNOSIS — K219 Gastro-esophageal reflux disease without esophagitis: Secondary | ICD-10-CM | POA: Diagnosis not present

## 2019-04-17 DIAGNOSIS — R262 Difficulty in walking, not elsewhere classified: Secondary | ICD-10-CM | POA: Diagnosis not present

## 2019-04-17 DIAGNOSIS — M150 Primary generalized (osteo)arthritis: Secondary | ICD-10-CM | POA: Diagnosis not present

## 2019-04-17 DIAGNOSIS — Z7901 Long term (current) use of anticoagulants: Secondary | ICD-10-CM | POA: Diagnosis not present

## 2019-04-17 DIAGNOSIS — R634 Abnormal weight loss: Secondary | ICD-10-CM | POA: Diagnosis not present

## 2019-04-17 DIAGNOSIS — G9009 Other idiopathic peripheral autonomic neuropathy: Secondary | ICD-10-CM | POA: Diagnosis not present

## 2019-04-17 DIAGNOSIS — I129 Hypertensive chronic kidney disease with stage 1 through stage 4 chronic kidney disease, or unspecified chronic kidney disease: Secondary | ICD-10-CM | POA: Diagnosis not present

## 2019-04-17 DIAGNOSIS — H9193 Unspecified hearing loss, bilateral: Secondary | ICD-10-CM | POA: Diagnosis not present

## 2019-04-17 DIAGNOSIS — I2510 Atherosclerotic heart disease of native coronary artery without angina pectoris: Secondary | ICD-10-CM | POA: Diagnosis not present

## 2019-04-17 DIAGNOSIS — F418 Other specified anxiety disorders: Secondary | ICD-10-CM | POA: Diagnosis not present

## 2019-04-17 DIAGNOSIS — Z6821 Body mass index (BMI) 21.0-21.9, adult: Secondary | ICD-10-CM | POA: Diagnosis not present

## 2019-04-17 DIAGNOSIS — F0390 Unspecified dementia without behavioral disturbance: Secondary | ICD-10-CM | POA: Diagnosis not present

## 2019-04-17 DIAGNOSIS — N1831 Chronic kidney disease, stage 3a: Secondary | ICD-10-CM | POA: Diagnosis not present

## 2019-04-17 DIAGNOSIS — E785 Hyperlipidemia, unspecified: Secondary | ICD-10-CM | POA: Diagnosis not present

## 2019-04-17 DIAGNOSIS — N401 Enlarged prostate with lower urinary tract symptoms: Secondary | ICD-10-CM | POA: Diagnosis not present

## 2019-04-17 DIAGNOSIS — M6281 Muscle weakness (generalized): Secondary | ICD-10-CM | POA: Diagnosis not present

## 2019-04-17 DIAGNOSIS — H3530 Unspecified macular degeneration: Secondary | ICD-10-CM | POA: Diagnosis not present

## 2019-04-17 DIAGNOSIS — R350 Frequency of micturition: Secondary | ICD-10-CM | POA: Diagnosis not present

## 2019-04-17 DIAGNOSIS — M791 Myalgia, unspecified site: Secondary | ICD-10-CM | POA: Diagnosis not present

## 2019-04-17 DIAGNOSIS — M25061 Hemarthrosis, right knee: Secondary | ICD-10-CM | POA: Diagnosis not present

## 2019-04-17 DIAGNOSIS — Z9181 History of falling: Secondary | ICD-10-CM | POA: Diagnosis not present

## 2019-04-20 DIAGNOSIS — M791 Myalgia, unspecified site: Secondary | ICD-10-CM | POA: Diagnosis not present

## 2019-04-20 DIAGNOSIS — I129 Hypertensive chronic kidney disease with stage 1 through stage 4 chronic kidney disease, or unspecified chronic kidney disease: Secondary | ICD-10-CM | POA: Diagnosis not present

## 2019-04-20 DIAGNOSIS — N1831 Chronic kidney disease, stage 3a: Secondary | ICD-10-CM | POA: Diagnosis not present

## 2019-04-20 DIAGNOSIS — F0390 Unspecified dementia without behavioral disturbance: Secondary | ICD-10-CM | POA: Diagnosis not present

## 2019-04-20 DIAGNOSIS — M25061 Hemarthrosis, right knee: Secondary | ICD-10-CM | POA: Diagnosis not present

## 2019-04-20 DIAGNOSIS — G9009 Other idiopathic peripheral autonomic neuropathy: Secondary | ICD-10-CM | POA: Diagnosis not present

## 2019-04-22 DIAGNOSIS — M25061 Hemarthrosis, right knee: Secondary | ICD-10-CM | POA: Diagnosis not present

## 2019-04-22 DIAGNOSIS — M791 Myalgia, unspecified site: Secondary | ICD-10-CM | POA: Diagnosis not present

## 2019-04-22 DIAGNOSIS — G9009 Other idiopathic peripheral autonomic neuropathy: Secondary | ICD-10-CM | POA: Diagnosis not present

## 2019-04-22 DIAGNOSIS — I129 Hypertensive chronic kidney disease with stage 1 through stage 4 chronic kidney disease, or unspecified chronic kidney disease: Secondary | ICD-10-CM | POA: Diagnosis not present

## 2019-04-22 DIAGNOSIS — F0390 Unspecified dementia without behavioral disturbance: Secondary | ICD-10-CM | POA: Diagnosis not present

## 2019-04-22 DIAGNOSIS — N1831 Chronic kidney disease, stage 3a: Secondary | ICD-10-CM | POA: Diagnosis not present

## 2019-04-26 DIAGNOSIS — N1831 Chronic kidney disease, stage 3a: Secondary | ICD-10-CM | POA: Diagnosis not present

## 2019-04-26 DIAGNOSIS — G9009 Other idiopathic peripheral autonomic neuropathy: Secondary | ICD-10-CM | POA: Diagnosis not present

## 2019-04-26 DIAGNOSIS — I129 Hypertensive chronic kidney disease with stage 1 through stage 4 chronic kidney disease, or unspecified chronic kidney disease: Secondary | ICD-10-CM | POA: Diagnosis not present

## 2019-04-26 DIAGNOSIS — M25061 Hemarthrosis, right knee: Secondary | ICD-10-CM | POA: Diagnosis not present

## 2019-04-26 DIAGNOSIS — F0390 Unspecified dementia without behavioral disturbance: Secondary | ICD-10-CM | POA: Diagnosis not present

## 2019-04-26 DIAGNOSIS — M791 Myalgia, unspecified site: Secondary | ICD-10-CM | POA: Diagnosis not present

## 2019-04-29 DIAGNOSIS — M25061 Hemarthrosis, right knee: Secondary | ICD-10-CM | POA: Diagnosis not present

## 2019-04-29 DIAGNOSIS — K219 Gastro-esophageal reflux disease without esophagitis: Secondary | ICD-10-CM | POA: Diagnosis not present

## 2019-04-29 DIAGNOSIS — Z7902 Long term (current) use of antithrombotics/antiplatelets: Secondary | ICD-10-CM | POA: Diagnosis not present

## 2019-04-29 DIAGNOSIS — F0390 Unspecified dementia without behavioral disturbance: Secondary | ICD-10-CM | POA: Diagnosis not present

## 2019-04-29 DIAGNOSIS — M791 Myalgia, unspecified site: Secondary | ICD-10-CM | POA: Diagnosis not present

## 2019-04-29 DIAGNOSIS — G9009 Other idiopathic peripheral autonomic neuropathy: Secondary | ICD-10-CM | POA: Diagnosis not present

## 2019-04-29 DIAGNOSIS — N1831 Chronic kidney disease, stage 3a: Secondary | ICD-10-CM | POA: Diagnosis not present

## 2019-04-29 DIAGNOSIS — M4800 Spinal stenosis, site unspecified: Secondary | ICD-10-CM | POA: Diagnosis not present

## 2019-04-29 DIAGNOSIS — I2510 Atherosclerotic heart disease of native coronary artery without angina pectoris: Secondary | ICD-10-CM | POA: Diagnosis not present

## 2019-04-29 DIAGNOSIS — I129 Hypertensive chronic kidney disease with stage 1 through stage 4 chronic kidney disease, or unspecified chronic kidney disease: Secondary | ICD-10-CM | POA: Diagnosis not present

## 2019-05-04 DIAGNOSIS — N1831 Chronic kidney disease, stage 3a: Secondary | ICD-10-CM | POA: Diagnosis not present

## 2019-05-04 DIAGNOSIS — F0390 Unspecified dementia without behavioral disturbance: Secondary | ICD-10-CM | POA: Diagnosis not present

## 2019-05-04 DIAGNOSIS — M791 Myalgia, unspecified site: Secondary | ICD-10-CM | POA: Diagnosis not present

## 2019-05-04 DIAGNOSIS — I129 Hypertensive chronic kidney disease with stage 1 through stage 4 chronic kidney disease, or unspecified chronic kidney disease: Secondary | ICD-10-CM | POA: Diagnosis not present

## 2019-05-04 DIAGNOSIS — G9009 Other idiopathic peripheral autonomic neuropathy: Secondary | ICD-10-CM | POA: Diagnosis not present

## 2019-05-04 DIAGNOSIS — M25061 Hemarthrosis, right knee: Secondary | ICD-10-CM | POA: Diagnosis not present

## 2019-05-05 DIAGNOSIS — H353221 Exudative age-related macular degeneration, left eye, with active choroidal neovascularization: Secondary | ICD-10-CM | POA: Diagnosis not present

## 2019-05-06 DIAGNOSIS — D518 Other vitamin B12 deficiency anemias: Secondary | ICD-10-CM | POA: Diagnosis not present

## 2019-05-06 DIAGNOSIS — F0390 Unspecified dementia without behavioral disturbance: Secondary | ICD-10-CM | POA: Diagnosis not present

## 2019-05-06 DIAGNOSIS — Z7901 Long term (current) use of anticoagulants: Secondary | ICD-10-CM | POA: Diagnosis not present

## 2019-05-06 DIAGNOSIS — J3489 Other specified disorders of nose and nasal sinuses: Secondary | ICD-10-CM | POA: Diagnosis not present

## 2019-05-06 DIAGNOSIS — F329 Major depressive disorder, single episode, unspecified: Secondary | ICD-10-CM | POA: Diagnosis not present

## 2019-05-08 DIAGNOSIS — M25061 Hemarthrosis, right knee: Secondary | ICD-10-CM | POA: Diagnosis not present

## 2019-05-08 DIAGNOSIS — M791 Myalgia, unspecified site: Secondary | ICD-10-CM | POA: Diagnosis not present

## 2019-05-08 DIAGNOSIS — I129 Hypertensive chronic kidney disease with stage 1 through stage 4 chronic kidney disease, or unspecified chronic kidney disease: Secondary | ICD-10-CM | POA: Diagnosis not present

## 2019-05-08 DIAGNOSIS — G9009 Other idiopathic peripheral autonomic neuropathy: Secondary | ICD-10-CM | POA: Diagnosis not present

## 2019-05-08 DIAGNOSIS — F0390 Unspecified dementia without behavioral disturbance: Secondary | ICD-10-CM | POA: Diagnosis not present

## 2019-05-08 DIAGNOSIS — N1831 Chronic kidney disease, stage 3a: Secondary | ICD-10-CM | POA: Diagnosis not present

## 2019-05-13 DIAGNOSIS — F0390 Unspecified dementia without behavioral disturbance: Secondary | ICD-10-CM | POA: Diagnosis not present

## 2019-05-13 DIAGNOSIS — G9009 Other idiopathic peripheral autonomic neuropathy: Secondary | ICD-10-CM | POA: Diagnosis not present

## 2019-05-13 DIAGNOSIS — M791 Myalgia, unspecified site: Secondary | ICD-10-CM | POA: Diagnosis not present

## 2019-05-13 DIAGNOSIS — I129 Hypertensive chronic kidney disease with stage 1 through stage 4 chronic kidney disease, or unspecified chronic kidney disease: Secondary | ICD-10-CM | POA: Diagnosis not present

## 2019-05-13 DIAGNOSIS — N1831 Chronic kidney disease, stage 3a: Secondary | ICD-10-CM | POA: Diagnosis not present

## 2019-05-13 DIAGNOSIS — M25061 Hemarthrosis, right knee: Secondary | ICD-10-CM | POA: Diagnosis not present

## 2019-05-17 DIAGNOSIS — I2510 Atherosclerotic heart disease of native coronary artery without angina pectoris: Secondary | ICD-10-CM | POA: Diagnosis not present

## 2019-05-17 DIAGNOSIS — R634 Abnormal weight loss: Secondary | ICD-10-CM | POA: Diagnosis not present

## 2019-05-17 DIAGNOSIS — N1831 Chronic kidney disease, stage 3a: Secondary | ICD-10-CM | POA: Diagnosis not present

## 2019-05-17 DIAGNOSIS — R262 Difficulty in walking, not elsewhere classified: Secondary | ICD-10-CM | POA: Diagnosis not present

## 2019-05-17 DIAGNOSIS — M25061 Hemarthrosis, right knee: Secondary | ICD-10-CM | POA: Diagnosis not present

## 2019-05-17 DIAGNOSIS — N401 Enlarged prostate with lower urinary tract symptoms: Secondary | ICD-10-CM | POA: Diagnosis not present

## 2019-05-17 DIAGNOSIS — E785 Hyperlipidemia, unspecified: Secondary | ICD-10-CM | POA: Diagnosis not present

## 2019-05-17 DIAGNOSIS — M791 Myalgia, unspecified site: Secondary | ICD-10-CM | POA: Diagnosis not present

## 2019-05-17 DIAGNOSIS — R350 Frequency of micturition: Secondary | ICD-10-CM | POA: Diagnosis not present

## 2019-05-17 DIAGNOSIS — M6281 Muscle weakness (generalized): Secondary | ICD-10-CM | POA: Diagnosis not present

## 2019-05-17 DIAGNOSIS — F418 Other specified anxiety disorders: Secondary | ICD-10-CM | POA: Diagnosis not present

## 2019-05-17 DIAGNOSIS — G9009 Other idiopathic peripheral autonomic neuropathy: Secondary | ICD-10-CM | POA: Diagnosis not present

## 2019-05-17 DIAGNOSIS — H9193 Unspecified hearing loss, bilateral: Secondary | ICD-10-CM | POA: Diagnosis not present

## 2019-05-17 DIAGNOSIS — Z9181 History of falling: Secondary | ICD-10-CM | POA: Diagnosis not present

## 2019-05-17 DIAGNOSIS — K219 Gastro-esophageal reflux disease without esophagitis: Secondary | ICD-10-CM | POA: Diagnosis not present

## 2019-05-17 DIAGNOSIS — Z7901 Long term (current) use of anticoagulants: Secondary | ICD-10-CM | POA: Diagnosis not present

## 2019-05-17 DIAGNOSIS — I129 Hypertensive chronic kidney disease with stage 1 through stage 4 chronic kidney disease, or unspecified chronic kidney disease: Secondary | ICD-10-CM | POA: Diagnosis not present

## 2019-05-17 DIAGNOSIS — H5461 Unqualified visual loss, right eye, normal vision left eye: Secondary | ICD-10-CM | POA: Diagnosis not present

## 2019-05-17 DIAGNOSIS — H3530 Unspecified macular degeneration: Secondary | ICD-10-CM | POA: Diagnosis not present

## 2019-05-17 DIAGNOSIS — Z6821 Body mass index (BMI) 21.0-21.9, adult: Secondary | ICD-10-CM | POA: Diagnosis not present

## 2019-05-17 DIAGNOSIS — F0390 Unspecified dementia without behavioral disturbance: Secondary | ICD-10-CM | POA: Diagnosis not present

## 2019-05-17 DIAGNOSIS — M150 Primary generalized (osteo)arthritis: Secondary | ICD-10-CM | POA: Diagnosis not present

## 2019-05-18 DIAGNOSIS — F0390 Unspecified dementia without behavioral disturbance: Secondary | ICD-10-CM | POA: Diagnosis not present

## 2019-05-18 DIAGNOSIS — I129 Hypertensive chronic kidney disease with stage 1 through stage 4 chronic kidney disease, or unspecified chronic kidney disease: Secondary | ICD-10-CM | POA: Diagnosis not present

## 2019-05-18 DIAGNOSIS — N1831 Chronic kidney disease, stage 3a: Secondary | ICD-10-CM | POA: Diagnosis not present

## 2019-05-18 DIAGNOSIS — G9009 Other idiopathic peripheral autonomic neuropathy: Secondary | ICD-10-CM | POA: Diagnosis not present

## 2019-05-18 DIAGNOSIS — M791 Myalgia, unspecified site: Secondary | ICD-10-CM | POA: Diagnosis not present

## 2019-05-18 DIAGNOSIS — M25061 Hemarthrosis, right knee: Secondary | ICD-10-CM | POA: Diagnosis not present

## 2019-11-11 IMAGING — CT CT CERVICAL SPINE W/O CM
4 of 7 series · 14 of 33 positions shown, 15 images · non-contrast
Comparison: Cervical spine radiographs 01/10/2017. Brain MRI
11/12/2013.

CLINICAL DATA: Fall 4 days ago. On blood thinners. Headache.
Initial encounter.

EXAM:
CT HEAD WITHOUT CONTRAST
CT CERVICAL SPINE WITHOUT CONTRAST
TECHNIQUE: Multidetector CT imaging of the head and cervical spine was
performed following the standard protocol without intravenous
contrast. Multiplanar CT image reconstructions of the cervical spine
were also generated.

[Series 7: c_spine 2.0 i30s 3 · axial · 0.33mm/px · z∈[-279,-161]mm · 4 of 99 slices shown, 5 images]
[im 20/99  soft-tissue]
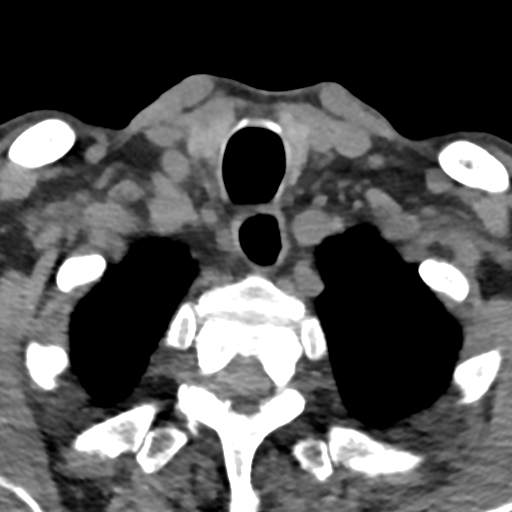
[im 20/99  bone]
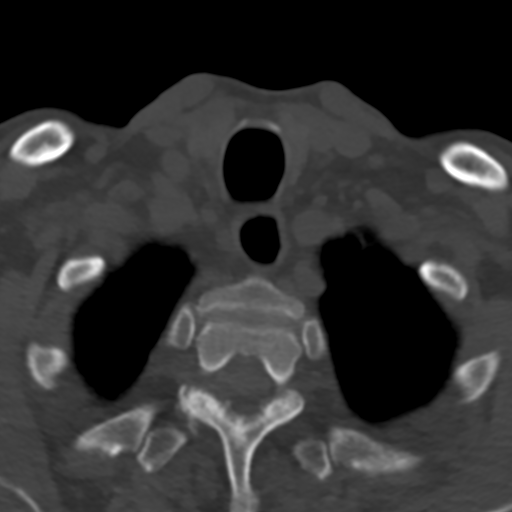
[im 40/99  bone]
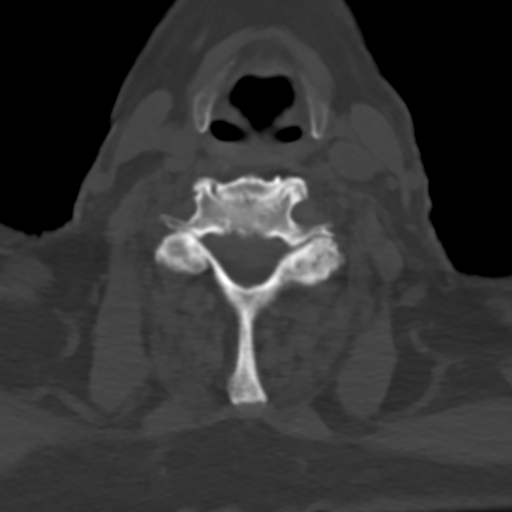
[im 59/99  bone]
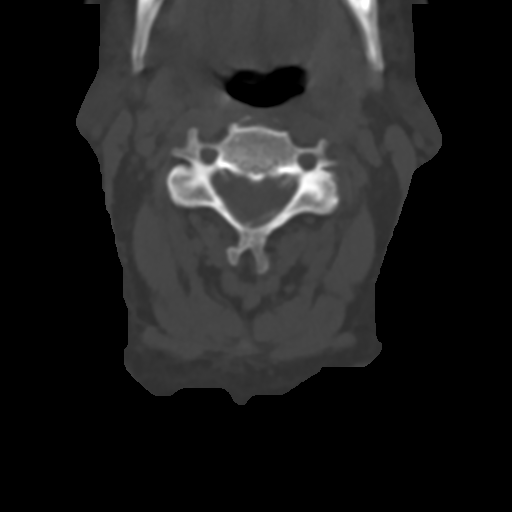
[im 79/99  bone]
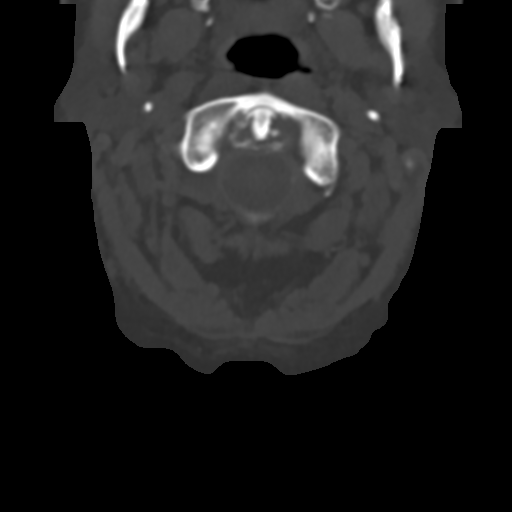

[Series 9: coronals · coronal · 0.27mm/px · 1 of 53 slices shown]
[im 27/53  bone]
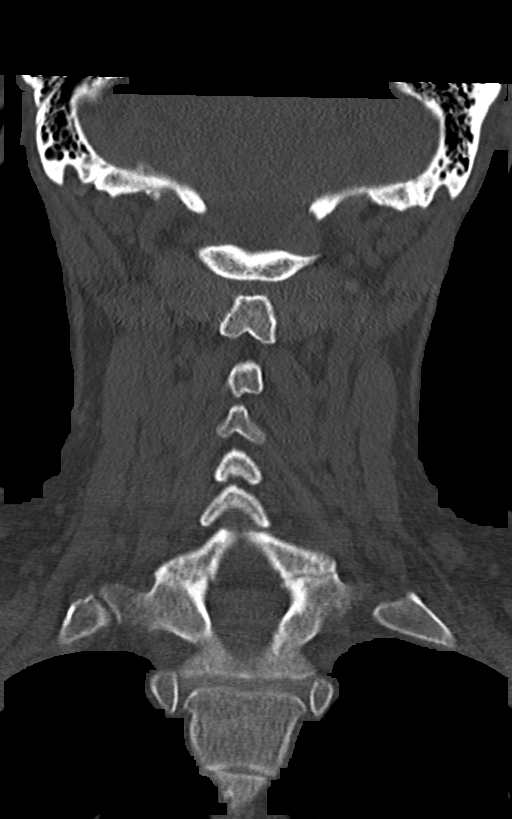

[Series 10: sagittals · sagittal · 0.27mm/px · 5 of 62 slices shown]
[im 11/62  bone]
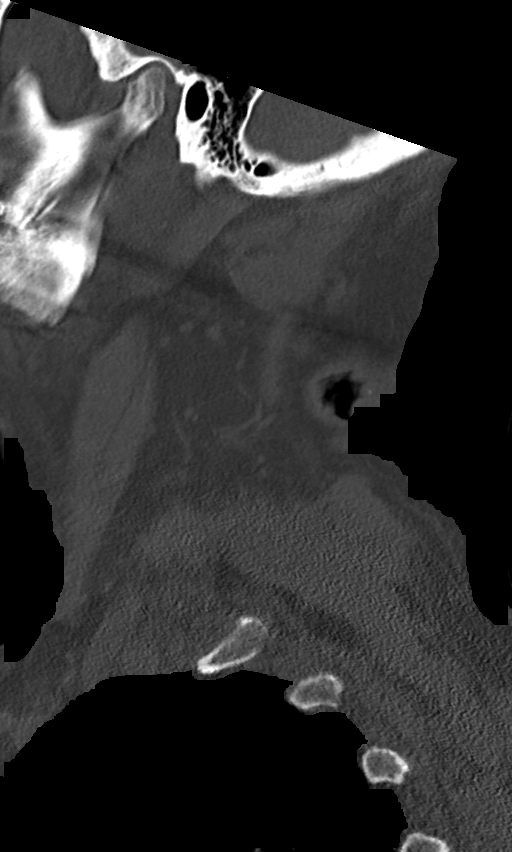
[im 21/62  bone]
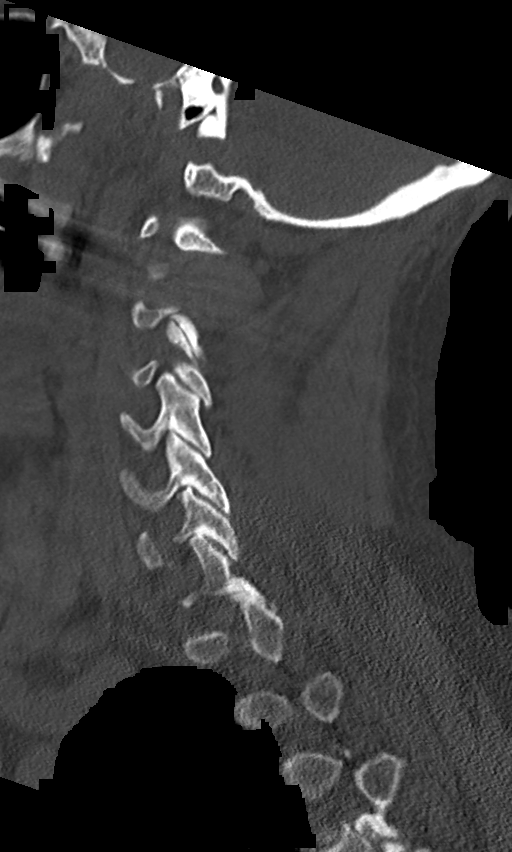
[im 31/62  bone]
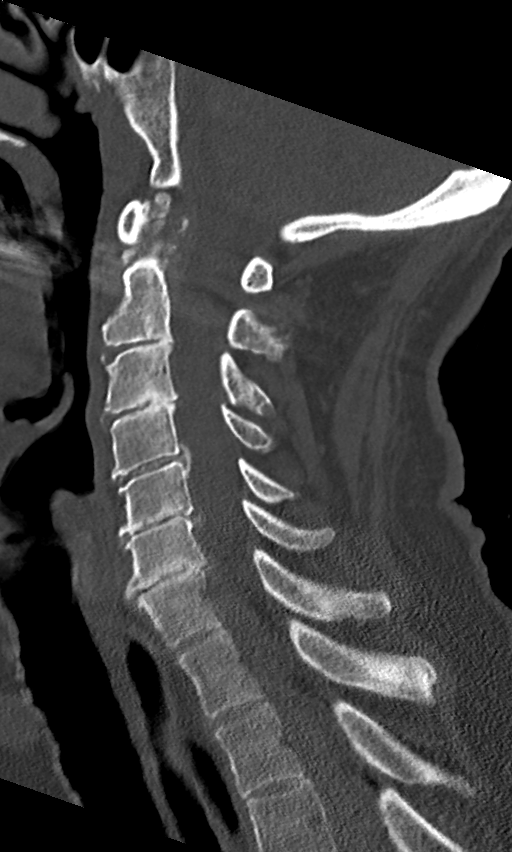
[im 41/62  bone]
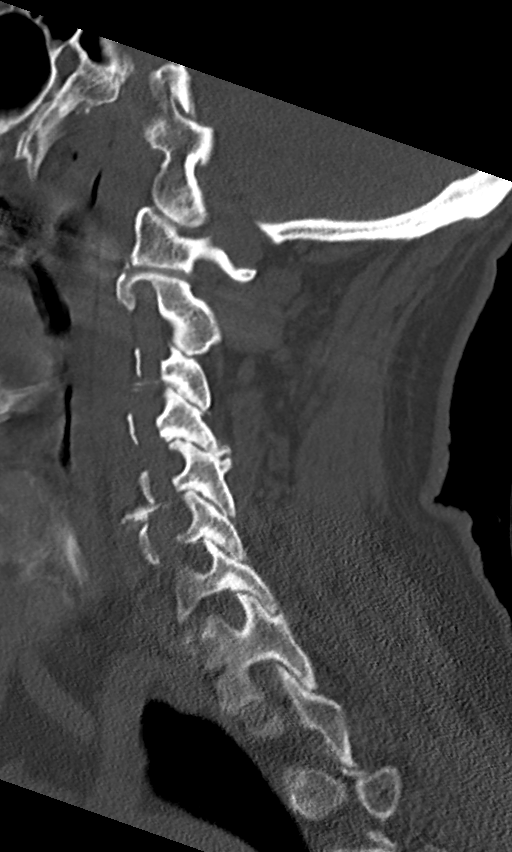
[im 51/62  bone]
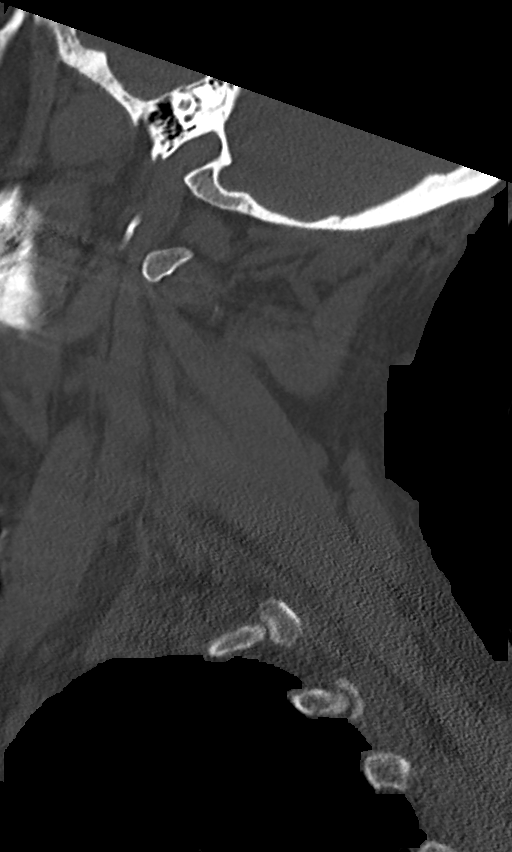

[Series 11: orthogonals · axial · 0.29mm/px · z∈[-291,-178]mm · 4 of 101 slices shown]
[im 21/101  bone]
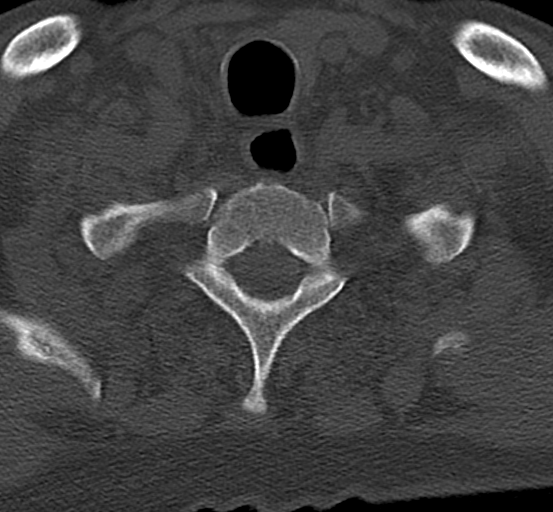
[im 41/101  bone]
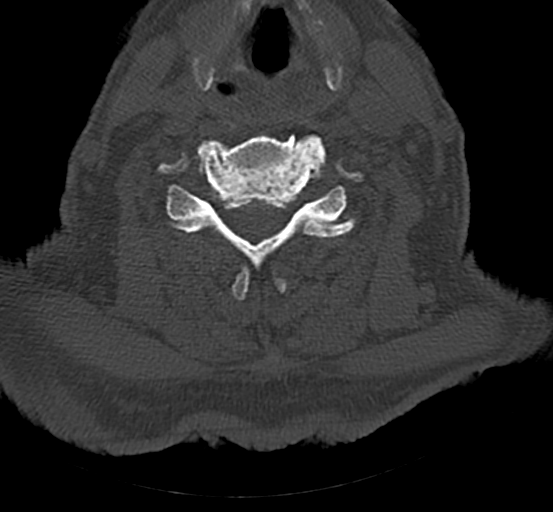
[im 61/101  bone]
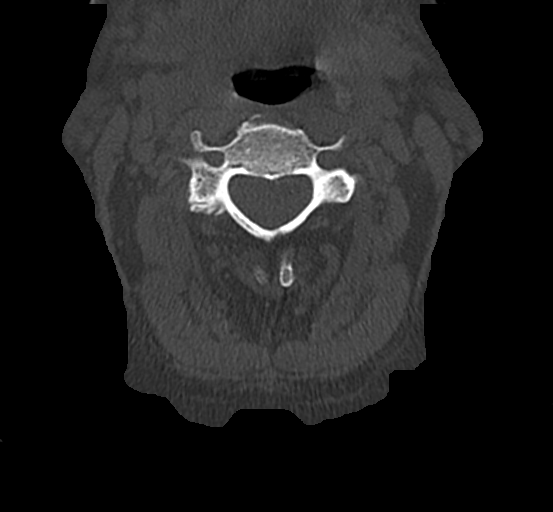
[im 81/101  bone]
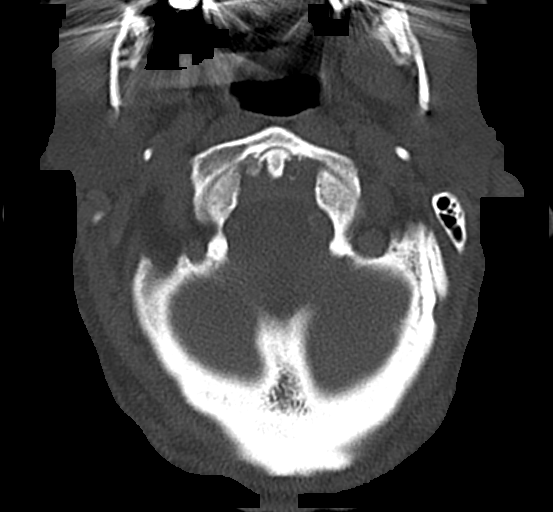

[14 of 33 positions shown; findings below may reference images not displayed]

FINDINGS: CT HEAD FINDINGS

Brain: There is no evidence of acute infarct, intracranial
hemorrhage, mass, midline shift, or extra-axial fluid collection.
Generalized cerebral atrophy is similar to the prior MRI.
Periventricular white matter hypodensities are stable to mildly
progressed from the T2 signal abnormality on the prior MRI and are
nonspecific but compatible with mild chronic small vessel ischemic
disease for age.

Vascular: Calcified atherosclerosis at the skull base. No hyperdense
vessel.

Skull: No fracture or focal osseous lesion.

Sinuses/Orbits: Mild bilateral ethmoid air cell mucosal thickening.
Clear mastoid air cells. Bilateral cataract extraction.

Other: Small left forehead hematoma.

CT CERVICAL SPINE FINDINGS

Alignment: Trace retrolisthesis of C3 on C4, C5 on C6, and C6 on C7,
degenerative in appearance and unchanged.

Skull base and vertebrae: No acute fracture or suspicious osseous
lesion.

Soft tissues and spinal canal: No prevertebral fluid or swelling. No
visible canal hematoma.

Disc levels: Advanced diffuse cervical disc degeneration with severe
disc space narrowing from C2-3 to C6-7. Mild multilevel spinal
stenosis. Moderate multilevel neural foraminal stenosis. Moderate to
severe facet arthrosis on the right at C2-3 and on the left at C4-5.

Upper chest: Clear lung apices.

Other: None.
IMPRESSION: 1. No evidence of acute intracranial abnormality.
2. Mild chronic small vessel ischemic disease.
3. Small left forehead scalp hematoma.
4. No acute cervical spine fracture.
5. diffuse cervical disc degeneration.

## 2023-11-03 DEATH — deceased
